# Patient Record
Sex: Male | Born: 1950 | Race: Black or African American | Hispanic: No | Marital: Married | Smoking: Former smoker
Health system: Southern US, Community
[De-identification: ages and names within clinical notes are randomized; demographics above are authoritative.]

## PROBLEM LIST (undated history)

## (undated) DIAGNOSIS — G4730 Sleep apnea, unspecified: Secondary | ICD-10-CM

## (undated) DIAGNOSIS — I10 Essential (primary) hypertension: Secondary | ICD-10-CM

## (undated) DIAGNOSIS — M1990 Unspecified osteoarthritis, unspecified site: Secondary | ICD-10-CM

## (undated) DIAGNOSIS — E7800 Pure hypercholesterolemia, unspecified: Secondary | ICD-10-CM

## (undated) HISTORY — PX: OTHER SURGICAL HISTORY: SHX169

## (undated) HISTORY — PX: BACK SURGERY: SHX140

---

## 2012-11-26 ENCOUNTER — Other Ambulatory Visit: Payer: Self-pay | Admitting: Orthopedic Surgery

## 2012-11-26 MED ORDER — DEXAMETHASONE SODIUM PHOSPHATE 10 MG/ML IJ SOLN: 10.0000 mg | Freq: Once | INTRAMUSCULAR | Status: DC

## 2012-11-26 MED ORDER — BUPIVACAINE LIPOSOME 1.3 % IJ SUSP: 20.0000 mL | Freq: Once | INTRAMUSCULAR | Status: DC

## 2012-11-26 NOTE — Progress Notes (Signed)
Preoperative surgical orders have been place into the Epic hospital system for Gearldine Bienenstock on 11/26/2012, 5:21 PM  by Patrica Duel for surgery on 12/13/2012.  Preop Bilateral Total Knee orders including Epidural per Anesthesia, IV Tylenol, and IV Decadron as long as there are no contraindications to the above medications. Avel Peace, PA-C

## 2012-11-26 NOTE — Progress Notes (Signed)
Need orders when possible please - Pt coming for PST on Tues 12/07/12 -  Thank you

## 2012-11-29 NOTE — Progress Notes (Signed)
Preop 12/07/12 at 1100am. Surgery scheduled for 12/13/12.  Need orders in EPIC.  Thank You.

## 2012-11-30 ENCOUNTER — Other Ambulatory Visit: Payer: Self-pay | Admitting: Orthopedic Surgery

## 2012-11-30 ENCOUNTER — Encounter (HOSPITAL_COMMUNITY): Payer: Self-pay | Admitting: Pharmacy Technician

## 2012-12-06 NOTE — Patient Instructions (Addendum)
Everhett Bozard  12/06/2012   Your procedure is scheduled on:  12/13/12   Report to [facility] at     1045  AM.  Call this number if you have problems the morning of surgery: [phone]   Remember:   Do not eat food   After midnite. May have clear liquids until 0700am then npo     Take these medicines the morning of surgery with A SIP OF WATER:    Do not wear jewelry,   Do not wear lotions, powders, or perfumes.    Men may shave face and neck.  Do not bring valuables to the hospital.  Contacts, dentures or bridgework may not be worn into surgery.  Leave suitcase in the car. After surgery it may be brought to your room.  For patients admitted to the hospital, checkout time is 11:00 AM the day of  discharge.    SEE CHG INSTRUCTION SHEET    Please read over the following fact sheets that you were given: MRSA Information, coughing and deep breathing exercises, leg exercises, Blood Transfusion Fact sheet, Incentive Spirometry fact sheet                Failure to comply with these instructions may result in cancellation of your surgery.                Patient Signature ____________________________              Nurse Signature _____________________________

## 2012-12-07 ENCOUNTER — Ambulatory Visit (HOSPITAL_COMMUNITY)
Admission: RE | Admit: 2012-12-07 | Discharge: 2012-12-07 | Disposition: A | Discharge status: Home / Self Care | Payer: BC Managed Care – PPO | Source: Ambulatory Visit | Attending: Orthopedic Surgery | Admitting: Orthopedic Surgery

## 2012-12-07 ENCOUNTER — Other Ambulatory Visit: Payer: Self-pay | Admitting: Orthopedic Surgery

## 2012-12-07 ENCOUNTER — Encounter (HOSPITAL_COMMUNITY)
Admission: RE | Admit: 2012-12-07 | Discharge: 2012-12-07 | Disposition: A | Discharge status: Home / Self Care | Payer: BC Managed Care – PPO | Source: Ambulatory Visit | Attending: Orthopedic Surgery | Admitting: Orthopedic Surgery

## 2012-12-07 ENCOUNTER — Encounter (HOSPITAL_COMMUNITY): Payer: Self-pay

## 2012-12-07 DIAGNOSIS — R918 Other nonspecific abnormal finding of lung field: Secondary | ICD-10-CM | POA: Insufficient documentation

## 2012-12-07 DIAGNOSIS — Z01812 Encounter for preprocedural laboratory examination: Secondary | ICD-10-CM | POA: Insufficient documentation

## 2012-12-07 DIAGNOSIS — I10 Essential (primary) hypertension: Secondary | ICD-10-CM | POA: Insufficient documentation

## 2012-12-07 DIAGNOSIS — Z87891 Personal history of nicotine dependence: Secondary | ICD-10-CM | POA: Insufficient documentation

## 2012-12-07 DIAGNOSIS — Z01818 Encounter for other preprocedural examination: Secondary | ICD-10-CM | POA: Insufficient documentation

## 2012-12-07 DIAGNOSIS — M5380 Other specified dorsopathies, site unspecified: Secondary | ICD-10-CM | POA: Insufficient documentation

## 2012-12-07 DIAGNOSIS — Z0183 Encounter for blood typing: Secondary | ICD-10-CM | POA: Insufficient documentation

## 2012-12-07 HISTORY — DX: Unspecified osteoarthritis, unspecified site: M19.90

## 2012-12-07 HISTORY — DX: Sleep apnea, unspecified: G47.30

## 2012-12-07 HISTORY — DX: Essential (primary) hypertension: I10

## 2012-12-07 LAB — URINE MICROSCOPIC-ADD ON

## 2012-12-07 LAB — URINALYSIS, ROUTINE W REFLEX MICROSCOPIC
Glucose, UA: NEGATIVE mg/dL
Hgb urine dipstick: NEGATIVE
Specific Gravity, Urine: 1.021 (ref 1.005–1.030)

## 2012-12-07 LAB — SURGICAL PCR SCREEN
MRSA, PCR: NEGATIVE
Staphylococcus aureus: NEGATIVE

## 2012-12-07 LAB — ABO/RH: ABO/RH(D): A POS

## 2012-12-07 LAB — APTT: aPTT: 32 seconds (ref 24–37)

## 2012-12-07 NOTE — Progress Notes (Signed)
CBC and CMP done 11/25/12 along with Office Visit note from [facility].

## 2012-12-07 NOTE — Progress Notes (Signed)
Urinalysis with micro results faxed via EPIC fax to Dr Lequita Halt.

## 2012-12-07 NOTE — Progress Notes (Signed)
CXR results faxed via EPIC to Dr Aluisio.   

## 2012-12-09 LAB — URINE CULTURE

## 2012-12-12 ENCOUNTER — Other Ambulatory Visit: Payer: Self-pay | Admitting: Orthopedic Surgery

## 2012-12-12 MED ORDER — DEXTROSE 5 % IV SOLN
3.0000 g | INTRAVENOUS | Status: DC
  Filled 2012-12-12: qty 3000

## 2012-12-12 NOTE — H&P (Signed)
Kent Ryan  DOB: 1950-12-20 Married / Language: [place] / Race: Black or African American Male  Date of Admission:  12/13/2012  Chief Complaint:  Bilateral Knee Pain  History of Present Illness The patient is a 62 year old male who comes in for a preoperative History and Physical. The patient is scheduled for a bilateral total knee arthroplasty to be performed by Dr. Gus Rankin. Aluisio, MD at [facility] on 12/13/2012. The patient is a 62 year old male who presents with knee complaints. The patient is seen for both knees. The patient reports left knee and right knee symptoms including: pain, swelling, instability, giving way and stiffness which began year(s) ago without any known injury. Note for "Knee pain": He has had cortisone injections by his PCP and an orthopaedic in [place] in the past. He said they used to help, when he first started getting them. They no longer help. He said it may have been 2 years or more since his last cortisone injection. He had gout about a month ago and was treated by his PCP. [name] comes in today with a chief complaint of bilateral knee pain. He reports that he has had issues with this for several years. He has had no specific injuries to the knees. He has just had progressive worsening of arthritis. He has been treated for this previously both by his primary care physician and another orthopaedic physician in [place]. He says that he has had Cortisone injections over the years. He has not had Visco supplementation. He denies any history of surgery on the knees including arthroscopic procedures. He has had trouble with the right knee longer than he has with the left knee, but says currently they bother him about the same amount. Some days the left knee bothers him more. Some days the right knee bothers him more. He has pain at all times even at rest, even at night when he is laying in bed. He has significant issues with weightbearing almost  finding it difficult to work because of the inability to stand for long periods of time. He does have some instability in the knees, but says he has not fallen. In regards to the Cortisone injections he said when he first started those they were beneficial, but now they do not work for more than a few weeks at a time. He is aware that he needs to have surgery, but has not had the discussion about surgery with anyone yet. He denies groin pain. No numbness or tingling in the legs. No back issues.  He is ready to proceed with surgery at this time. They have been treated conservatively in the past for the above stated problem and despite conservative measures, they continue to have progressive pain and severe functional limitations and dysfunction. They have failed non-operative management including home exercise, medications, and injections. It is felt that they would benefit from undergoing total joint replacement. Risks and benefits of the procedure have been discussed with the patient and they elect to proceed with surgery. There are no active contraindications to surgery such as ongoing infection or rapidly progressive neurological disease.   Problem List Primary osteoarthritis of both knees (715.16)   Allergies No Known Drug Allergies. 09/02/2012   Family History Heart disease in male family member before age 26 First Degree Relatives. reported   Social History Illicit drug use. no Living situation. live with spouse Marital status. married Drug/Alcohol Rehab (Currently). no Drug/Alcohol Rehab (Previously). no Exercise. Exercises never Tobacco /  smoke exposure. no Tobacco use. never smoker Never smoker Number of flights of stairs before winded. less than 1 Pain Contract. yes Current work status. working part time Alcohol use. current drinker; drinks wine; only occasionally per week Children. 3 Post-Surgical Plans. Plan is to go to [facility].   Medication  History Lisinopril-Hydrochlorothiazide (20-12.5MG  Tablet, Oral) Active. Hydrocodone-Acetaminophen (5-300MG  Tablet, Oral) Active. Diazepam (5MG  Tablet, Oral) Active. Atorvastatin Calcium (40MG  Tablet, Oral) Active. Ibuprofen (800MG  Tablet, Oral) Active. Vitamin B Complex ( Oral) Active. Flonase (50MCG/ACT Suspension, Nasal) Active.   Past Surgical History Arthroscopy of Knee. bilateral Spinal Surgery   Medical History Gout. 2 episodes in the past Sleep Apnea. uses CPAP High blood pressure Hypercholesterolemia   Review of Systems General:Not Present- Chills, Fever, Night Sweats, Appetite Loss, Fatigue, Feeling sick, Weight Gain and Weight Loss. Skin:Not Present- Itching, Rash, Skin Color Changes, Ulcer, Psoriasis and Change in Hair or Nails. HEENT:Present- Hearing problems and Ringing in the Ears. Not Present- Sensitivity to light and Nose Bleed. Neck:Not Present- Swollen Glands and Neck Mass. Respiratory:Not Present- Snoring, Chronic Cough, Bloody sputum and Dyspnea. Cardiovascular:Present- Swelling of Extremities and Leg Cramps. Not Present- Shortness of Breath, Chest Pain and Palpitations. Gastrointestinal:Not Present- Bloody Stool, Heartburn, Abdominal Pain, Vomiting, Nausea and Incontinence of Stool. Male Genitourinary:Not Present- Blood in Urine, Frequency, Incontinence and Nocturia. Musculoskeletal:Present- Muscle Weakness, Muscle Pain, Joint Stiffness, Joint Swelling, Joint Pain and Back Pain. Neurological:Present- Tingling and Numbness. Not Present- Burning, Tremor, Headaches and Dizziness. Psychiatric:Not Present- Anxiety, Depression and Memory Loss. Endocrine:Not Present- Cold Intolerance, Heat Intolerance, Excessive hunger and Excessive Thirst. Hematology:Not Present- Abnormal Bleeding, Anemia, Blood Clots and Easy Bruising.   Vitals Pulse: 72 (Regular) Resp.: 16 (Unlabored) BP: 132/92 (Sitting, Right Arm, Standard)    Physical  Exam The physical exam findings are as follows:  Note: Patient is a 62 year old male with bilateral knee pain. Patient is accompanied today by his sister.   General Mental Status - Alert, cooperative and good historian. General Appearance- pleasant. Not in acute distress. Orientation- Oriented X3. Build & Nutrition- Well nourished and Well developed.   Head and Neck Head- normocephalic, atraumatic . Neck Global Assessment- supple. no bruit auscultated on the right and no bruit auscultated on the left.   Eye Pupil- Bilateral- Regular and Round. Motion- Bilateral- EOMI.   ENMT upper partial dentures  Chest and Lung Exam Auscultation: Breath sounds:- clear at anterior chest wall and - clear at posterior chest wall. Adventitious sounds:- No Adventitious sounds.   Cardiovascular Auscultation:Rhythm- Regular rate and rhythm. Heart Sounds- S1 WNL and S2 WNL. Murmurs & Other Heart Sounds:Auscultation of the heart reveals - No Murmurs.   Abdomen Inspection:Contour- Generalized mild distention. Palpation/Percussion:Tenderness- Abdomen is non-tender to palpation. Rigidity (guarding)- Abdomen is soft. Auscultation:Auscultation of the abdomen reveals - Bowel sounds normal.   Male Genitourinary Not done, not pertinent to present illness  Musculoskeletal He is alert and oriented in no acute distress. He has no effusion or instability about the knees. He has a significant genu varus deformity bilaterally. Normal painless range of motion in the hips. In regards to the right knee, range of motion is 5 to 110 degrees. Left knee is 5 to 105. He has significant patellofemoral crepitus in both knees. He is tender medially greater than laterally in both knees. The calves are soft, nontender. Sensation and circulation are intact in the lower extremities and the motor function is intact in the lower extremities.  RADIOGRAPHS: X-rays of the knees, AP view  shows significant genu varus  deformity in both knees as well as complete collapse of the medial compartment of both knees. He has significant periarticular osteophyte formation on the medial portion of the tibial plateau as well as the medial femoral condyle. On the lateral view of the right knee, he has significant osteophyte formation about the patella and he is bone on bone in that medial compartment. On the left knee, he is also bone on bone in the medial compartment as well as patellofemoral compartment.  Assessment & Plan Primary osteoarthritis of both knees (715.16)  Note: Plan is for a Bilateral Total Knee Replacements by Dr. Lequita Halt.  The patient does not have any contraindications and will recieve TXA (tranexamic acid) prior to surgery.  Plan is to go to Rehab following the hospital stay.  Signed electronically by Lauraine Rinne, III PA-C

## 2012-12-13 ENCOUNTER — Encounter (HOSPITAL_COMMUNITY): Payer: Self-pay | Admitting: *Deleted

## 2012-12-13 ENCOUNTER — Inpatient Hospital Stay (HOSPITAL_COMMUNITY)
Admission: RE | Admit: 2012-12-13 | Discharge: 2012-12-22 | DRG: 471 | Disposition: A | Discharge status: Rehabilitation Facility | Payer: BC Managed Care – PPO | Source: Ambulatory Visit | Attending: Orthopedic Surgery | Admitting: Orthopedic Surgery

## 2012-12-13 ENCOUNTER — Encounter (HOSPITAL_COMMUNITY)
Admission: RE | Disposition: A | Discharge status: Rehabilitation Facility | Payer: Self-pay | Source: Ambulatory Visit | Attending: Orthopedic Surgery

## 2012-12-13 ENCOUNTER — Inpatient Hospital Stay (HOSPITAL_COMMUNITY): Payer: BC Managed Care – PPO | Admitting: Anesthesiology

## 2012-12-13 ENCOUNTER — Inpatient Hospital Stay (HOSPITAL_COMMUNITY): Payer: BC Managed Care – PPO

## 2012-12-13 ENCOUNTER — Encounter (HOSPITAL_COMMUNITY): Payer: Self-pay | Admitting: Anesthesiology

## 2012-12-13 DIAGNOSIS — K567 Ileus, unspecified: Secondary | ICD-10-CM

## 2012-12-13 DIAGNOSIS — M109 Gout, unspecified: Secondary | ICD-10-CM | POA: Diagnosis present

## 2012-12-13 DIAGNOSIS — D62 Acute posthemorrhagic anemia: Secondary | ICD-10-CM

## 2012-12-13 DIAGNOSIS — Y831 Surgical operation with implant of artificial internal device as the cause of abnormal reaction of the patient, or of later complication, without mention of misadventure at the time of the procedure: Secondary | ICD-10-CM | POA: Diagnosis not present

## 2012-12-13 DIAGNOSIS — E7800 Pure hypercholesterolemia, unspecified: Secondary | ICD-10-CM | POA: Diagnosis present

## 2012-12-13 DIAGNOSIS — E871 Hypo-osmolality and hyponatremia: Secondary | ICD-10-CM

## 2012-12-13 DIAGNOSIS — M1710 Unilateral primary osteoarthritis, unspecified knee: Secondary | ICD-10-CM | POA: Diagnosis present

## 2012-12-13 DIAGNOSIS — M179 Osteoarthritis of knee, unspecified: Secondary | ICD-10-CM | POA: Diagnosis present

## 2012-12-13 DIAGNOSIS — I10 Essential (primary) hypertension: Secondary | ICD-10-CM | POA: Diagnosis present

## 2012-12-13 DIAGNOSIS — Z87891 Personal history of nicotine dependence: Secondary | ICD-10-CM

## 2012-12-13 DIAGNOSIS — K560 Paralytic ileus: Secondary | ICD-10-CM | POA: Diagnosis not present

## 2012-12-13 DIAGNOSIS — G4730 Sleep apnea, unspecified: Secondary | ICD-10-CM | POA: Diagnosis present

## 2012-12-13 DIAGNOSIS — K929 Disease of digestive system, unspecified: Secondary | ICD-10-CM | POA: Diagnosis not present

## 2012-12-13 DIAGNOSIS — Z79899 Other long term (current) drug therapy: Secondary | ICD-10-CM

## 2012-12-13 DIAGNOSIS — Z96653 Presence of artificial knee joint, bilateral: Secondary | ICD-10-CM

## 2012-12-13 HISTORY — PX: TOTAL KNEE ARTHROPLASTY: SHX125

## 2012-12-13 LAB — TYPE AND SCREEN
ABO/RH(D): A POS
Antibody Screen: NEGATIVE

## 2012-12-13 SURGERY — ARTHROPLASTY, KNEE, BILATERAL, TOTAL
Anesthesia: Epidural | Site: Knee | Laterality: Bilateral | Wound class: Clean

## 2012-12-13 MED ORDER — NALBUPHINE HCL 10 MG/ML IJ SOLN
5.0000 mg | INTRAMUSCULAR | Status: DC | PRN
  Filled 2012-12-13: qty 1

## 2012-12-13 MED ORDER — CEFAZOLIN SODIUM-DEXTROSE 2-3 GM-% IV SOLR
2.0000 g | INTRAVENOUS | Status: AC
  Administered 2012-12-13: 2 g via INTRAVENOUS

## 2012-12-13 MED ORDER — CHLORHEXIDINE GLUCONATE 4 % EX LIQD: 60.0000 mL | Freq: Once | CUTANEOUS | Status: DC

## 2012-12-13 MED ORDER — FENTANYL CITRATE 0.05 MG/ML IJ SOLN: 25.0000 ug | INTRAMUSCULAR | Status: DC | PRN

## 2012-12-13 MED ORDER — ACETAMINOPHEN 650 MG RE SUPP
650.0000 mg | Freq: Four times a day (QID) | RECTAL | Status: AC
  Filled 2012-12-13: qty 1

## 2012-12-13 MED ORDER — FENTANYL CITRATE 0.05 MG/ML IJ SOLN
INTRAMUSCULAR | Status: DC | PRN
  Administered 2012-12-13: 100 ug via INTRAVENOUS

## 2012-12-13 MED ORDER — DEXAMETHASONE SODIUM PHOSPHATE 10 MG/ML IJ SOLN
10.0000 mg | Freq: Every day | INTRAMUSCULAR | Status: AC
  Filled 2012-12-13: qty 1

## 2012-12-13 MED ORDER — MEPERIDINE HCL 50 MG/ML IJ SOLN: 6.2500 mg | INTRAMUSCULAR | Status: DC | PRN

## 2012-12-13 MED ORDER — STERILE WATER FOR IRRIGATION IR SOLN
Status: DC | PRN
  Administered 2012-12-13: 3000 mL

## 2012-12-13 MED ORDER — ONDANSETRON HCL 4 MG PO TABS: 4.0000 mg | ORAL_TABLET | Freq: Four times a day (QID) | ORAL | Status: DC | PRN

## 2012-12-13 MED ORDER — PHENOL 1.4 % MT LIQD
1.0000 | OROMUCOSAL | Status: DC | PRN
  Filled 2012-12-13: qty 177

## 2012-12-13 MED ORDER — SODIUM CHLORIDE 0.9 % IV SOLN
INTRAVENOUS | Status: DC
  Administered 2012-12-13 – 2012-12-15 (×4): via EPIDURAL
  Filled 2012-12-13 (×14): qty 20

## 2012-12-13 MED ORDER — KETOROLAC TROMETHAMINE 60 MG/2ML IM SOLN
60.0000 mg | Freq: Once | INTRAMUSCULAR | Status: DC | PRN
  Filled 2012-12-13: qty 2

## 2012-12-13 MED ORDER — ACETAMINOPHEN 500 MG PO TABS
1000.0000 mg | ORAL_TABLET | Freq: Four times a day (QID) | ORAL | Status: AC
  Administered 2012-12-13 – 2012-12-14 (×4): 1000 mg via ORAL
  Filled 2012-12-13 (×4): qty 2

## 2012-12-13 MED ORDER — LACTATED RINGERS IV SOLN
INTRAVENOUS | Status: DC | PRN
  Administered 2012-12-13 (×3): via INTRAVENOUS

## 2012-12-13 MED ORDER — DIPHENHYDRAMINE HCL 50 MG/ML IJ SOLN: 25.0000 mg | INTRAMUSCULAR | Status: DC | PRN

## 2012-12-13 MED ORDER — SODIUM CHLORIDE 0.9 % IV SOLN: INTRAVENOUS | Status: DC

## 2012-12-13 MED ORDER — ONDANSETRON HCL 4 MG/2ML IJ SOLN
INTRAMUSCULAR | Status: DC | PRN
  Administered 2012-12-13: 4 mg via INTRAVENOUS

## 2012-12-13 MED ORDER — TRAMADOL HCL 50 MG PO TABS
50.0000 mg | ORAL_TABLET | Freq: Four times a day (QID) | ORAL | Status: DC | PRN
  Administered 2012-12-17 – 2012-12-20 (×3): 100 mg via ORAL
  Filled 2012-12-13 (×3): qty 2

## 2012-12-13 MED ORDER — TRANEXAMIC ACID 100 MG/ML IV SOLN
1000.0000 mg | INTRAVENOUS | Status: AC
  Administered 2012-12-13: 1000 mg via INTRAVENOUS
  Filled 2012-12-13: qty 10

## 2012-12-13 MED ORDER — PHENYLEPHRINE HCL 10 MG/ML IJ SOLN
10.0000 mg | INTRAVENOUS | Status: DC | PRN
  Administered 2012-12-13: 50 ug/min via INTRAVENOUS

## 2012-12-13 MED ORDER — DIPHENHYDRAMINE HCL 25 MG PO CAPS
25.0000 mg | ORAL_CAPSULE | ORAL | Status: DC | PRN
  Filled 2012-12-13: qty 1

## 2012-12-13 MED ORDER — ONDANSETRON HCL 4 MG/2ML IJ SOLN
4.0000 mg | Freq: Four times a day (QID) | INTRAMUSCULAR | Status: DC | PRN
  Administered 2012-12-14 – 2012-12-20 (×4): 4 mg via INTRAVENOUS
  Filled 2012-12-13 (×2): qty 2

## 2012-12-13 MED ORDER — ACETAMINOPHEN 10 MG/ML IV SOLN: 1000.0000 mg | Freq: Once | INTRAVENOUS | Status: DC

## 2012-12-13 MED ORDER — KETOROLAC TROMETHAMINE 30 MG/ML IJ SOLN: 30.0000 mg | Freq: Four times a day (QID) | INTRAMUSCULAR | Status: DC | PRN

## 2012-12-13 MED ORDER — PROMETHAZINE HCL 25 MG/ML IJ SOLN: 6.2500 mg | INTRAMUSCULAR | Status: DC | PRN

## 2012-12-13 MED ORDER — PROPOFOL 10 MG/ML IV BOLUS
INTRAVENOUS | Status: DC | PRN
  Administered 2012-12-13: 250 mg via INTRAVENOUS

## 2012-12-13 MED ORDER — CEFAZOLIN SODIUM-DEXTROSE 2-3 GM-% IV SOLR
2.0000 g | Freq: Four times a day (QID) | INTRAVENOUS | Status: AC
  Administered 2012-12-13 – 2012-12-14 (×2): 2 g via INTRAVENOUS
  Filled 2012-12-13 (×2): qty 50

## 2012-12-13 MED ORDER — 0.9 % SODIUM CHLORIDE (POUR BTL) OPTIME
TOPICAL | Status: DC | PRN
  Administered 2012-12-13: 1000 mL

## 2012-12-13 MED ORDER — DIPHENHYDRAMINE HCL 12.5 MG/5ML PO ELIX: 12.5000 mg | ORAL_SOLUTION | ORAL | Status: DC | PRN

## 2012-12-13 MED ORDER — FLEET ENEMA 7-19 GM/118ML RE ENEM: 1.0000 | ENEMA | Freq: Once | RECTAL | Status: AC | PRN

## 2012-12-13 MED ORDER — DEXAMETHASONE 6 MG PO TABS
10.0000 mg | ORAL_TABLET | Freq: Every day | ORAL | Status: AC
  Administered 2012-12-14: 10 mg via ORAL
  Filled 2012-12-13: qty 1

## 2012-12-13 MED ORDER — NALOXONE HCL 0.4 MG/ML IJ SOLN: 0.4000 mg | INTRAMUSCULAR | Status: DC | PRN

## 2012-12-13 MED ORDER — MENTHOL 3 MG MT LOZG
1.0000 | LOZENGE | OROMUCOSAL | Status: DC | PRN
  Filled 2012-12-13: qty 9

## 2012-12-13 MED ORDER — NALOXONE HCL 1 MG/ML IJ SOLN
1.0000 ug/kg/h | INTRAVENOUS | Status: DC | PRN
  Filled 2012-12-13: qty 2

## 2012-12-13 MED ORDER — METHOCARBAMOL 500 MG PO TABS
500.0000 mg | ORAL_TABLET | Freq: Four times a day (QID) | ORAL | Status: DC | PRN
  Administered 2012-12-13 – 2012-12-20 (×17): 500 mg via ORAL
  Filled 2012-12-13 (×17): qty 1

## 2012-12-13 MED ORDER — METHOCARBAMOL 100 MG/ML IJ SOLN
500.0000 mg | Freq: Four times a day (QID) | INTRAVENOUS | Status: DC | PRN
  Administered 2012-12-14: 500 mg via INTRAVENOUS
  Filled 2012-12-13 (×2): qty 5

## 2012-12-13 MED ORDER — FLUTICASONE PROPIONATE 50 MCG/ACT NA SUSP: 2.0000 | Freq: Every day | NASAL | Status: DC | PRN

## 2012-12-13 MED ORDER — MIDAZOLAM HCL 5 MG/5ML IJ SOLN
INTRAMUSCULAR | Status: DC | PRN
  Administered 2012-12-13: 2 mg via INTRAVENOUS

## 2012-12-13 MED ORDER — BISACODYL 10 MG RE SUPP
10.0000 mg | Freq: Every day | RECTAL | Status: DC | PRN
  Administered 2012-12-18 – 2012-12-20 (×2): 10 mg via RECTAL
  Filled 2012-12-13 (×2): qty 1

## 2012-12-13 MED ORDER — DIAZEPAM 5 MG PO TABS
5.0000 mg | ORAL_TABLET | Freq: Four times a day (QID) | ORAL | Status: DC | PRN
  Administered 2012-12-13 – 2012-12-14 (×2): 5 mg via ORAL
  Filled 2012-12-13 (×2): qty 1

## 2012-12-13 MED ORDER — BUPIVACAINE HCL (PF) 0.5 % IJ SOLN
INTRAMUSCULAR | Status: DC | PRN
  Administered 2012-12-13 (×2): 5 mL
  Administered 2012-12-13: 5 mL via EPIDURAL
  Administered 2012-12-13 (×2): 5 mL

## 2012-12-13 MED ORDER — MORPHINE SULFATE 2 MG/ML IJ SOLN
1.0000 mg | INTRAMUSCULAR | Status: DC | PRN
  Administered 2012-12-14 – 2012-12-15 (×10): 2 mg via INTRAVENOUS
  Filled 2012-12-13 (×10): qty 1

## 2012-12-13 MED ORDER — LACTATED RINGERS IV SOLN: INTRAVENOUS | Status: DC

## 2012-12-13 MED ORDER — PHENYLEPHRINE HCL 10 MG/ML IJ SOLN
INTRAMUSCULAR | Status: DC | PRN
  Administered 2012-12-13 (×2): 40 ug via INTRAVENOUS

## 2012-12-13 MED ORDER — SCOPOLAMINE 1 MG/3DAYS TD PT72
1.0000 | MEDICATED_PATCH | Freq: Once | TRANSDERMAL | Status: AC
  Administered 2012-12-13: 1.5 mg via TRANSDERMAL
  Filled 2012-12-13: qty 1

## 2012-12-13 MED ORDER — SODIUM CHLORIDE 0.9 % IJ SOLN: 3.0000 mL | INTRAMUSCULAR | Status: DC | PRN

## 2012-12-13 MED ORDER — SODIUM CHLORIDE 0.9 % IV SOLN
INTRAVENOUS | Status: DC
  Administered 2012-12-13 – 2012-12-17 (×8): via INTRAVENOUS
  Administered 2012-12-17: 100 mL/h via INTRAVENOUS
  Administered 2012-12-18 – 2012-12-19 (×3): via INTRAVENOUS

## 2012-12-13 MED ORDER — LATANOPROST 0.005 % OP SOLN
1.0000 [drp] | Freq: Every day | OPHTHALMIC | Status: DC
  Administered 2012-12-13 – 2012-12-21 (×9): 1 [drp] via OPHTHALMIC
  Filled 2012-12-13: qty 2.5

## 2012-12-13 MED ORDER — ONDANSETRON HCL 4 MG/2ML IJ SOLN
4.0000 mg | Freq: Three times a day (TID) | INTRAMUSCULAR | Status: DC | PRN
  Filled 2012-12-13 (×2): qty 2

## 2012-12-13 MED ORDER — LIDOCAINE HCL (PF) 2 % IJ SOLN
INTRAMUSCULAR | Status: DC | PRN
  Administered 2012-12-13: 5 mL
  Administered 2012-12-13: 10 mL

## 2012-12-13 MED ORDER — DOCUSATE SODIUM 100 MG PO CAPS
100.0000 mg | ORAL_CAPSULE | Freq: Two times a day (BID) | ORAL | Status: DC
  Administered 2012-12-14 – 2012-12-21 (×14): 100 mg via ORAL
  Filled 2012-12-13 (×9): qty 1

## 2012-12-13 MED ORDER — OXYCODONE HCL 5 MG PO TABS
5.0000 mg | ORAL_TABLET | ORAL | Status: DC | PRN
  Administered 2012-12-13: 5 mg via ORAL
  Administered 2012-12-14 – 2012-12-15 (×4): 10 mg via ORAL
  Filled 2012-12-13 (×3): qty 2
  Filled 2012-12-13: qty 1
  Filled 2012-12-13: qty 2

## 2012-12-13 MED ORDER — METOCLOPRAMIDE HCL 10 MG PO TABS
5.0000 mg | ORAL_TABLET | Freq: Three times a day (TID) | ORAL | Status: DC | PRN
  Administered 2012-12-17 – 2012-12-19 (×3): 10 mg via ORAL
  Filled 2012-12-13 (×3): qty 1

## 2012-12-13 MED ORDER — DIPHENHYDRAMINE HCL 50 MG/ML IJ SOLN: 12.5000 mg | INTRAMUSCULAR | Status: DC | PRN

## 2012-12-13 MED ORDER — SODIUM CHLORIDE 0.9 % IR SOLN
Status: DC | PRN
  Administered 2012-12-13: 3000 mL

## 2012-12-13 MED ORDER — METOCLOPRAMIDE HCL 5 MG/ML IJ SOLN
5.0000 mg | Freq: Three times a day (TID) | INTRAMUSCULAR | Status: DC | PRN
  Filled 2012-12-13: qty 2

## 2012-12-13 MED ORDER — ATORVASTATIN CALCIUM 40 MG PO TABS
40.0000 mg | ORAL_TABLET | Freq: Every day | ORAL | Status: DC
  Administered 2012-12-14 – 2012-12-22 (×9): 40 mg via ORAL
  Filled 2012-12-13 (×10): qty 1

## 2012-12-13 MED ORDER — METOCLOPRAMIDE HCL 5 MG/ML IJ SOLN
10.0000 mg | Freq: Three times a day (TID) | INTRAMUSCULAR | Status: DC | PRN
  Administered 2012-12-17 – 2012-12-18 (×2): 10 mg via INTRAVENOUS
  Filled 2012-12-13 (×2): qty 2

## 2012-12-13 MED ORDER — MEPERIDINE HCL 50 MG/ML IJ SOLN
6.2500 mg | INTRAMUSCULAR | Status: DC | PRN
  Administered 2012-12-13: 12.5 mg via INTRAVENOUS

## 2012-12-13 MED ORDER — POLYETHYLENE GLYCOL 3350 17 G PO PACK: 17.0000 g | PACK | Freq: Every day | ORAL | Status: DC | PRN

## 2012-12-13 SURGICAL SUPPLY — 58 items
AUTOTRANSFUSION W/QD PVC DRAIN (AUTOTRANSFUSION) ×4 IMPLANT
BAG ZIPLOCK 12X15 (MISCELLANEOUS) ×4 IMPLANT
BANDAGE ELASTIC 6 VELCRO ST LF (GAUZE/BANDAGES/DRESSINGS) ×2 IMPLANT
BANDAGE ESMARK 6X9 LF (GAUZE/BANDAGES/DRESSINGS) ×2 IMPLANT
BLADE SAG 18X100X1.27 (BLADE) ×2 IMPLANT
BLADE SAW SGTL 11.0X1.19X90.0M (BLADE) ×4 IMPLANT
BLADE SURG SZ10 CARB STEEL (BLADE) ×4 IMPLANT
BNDG COHESIVE 6X5 TAN STRL LF (GAUZE/BANDAGES/DRESSINGS) ×2 IMPLANT
BNDG ELASTIC 2 VLCR STRL LF (GAUZE/BANDAGES/DRESSINGS) ×2 IMPLANT
BNDG ESMARK 6X9 LF (GAUZE/BANDAGES/DRESSINGS) ×4
BOWL SMART MIX CTS (DISPOSABLE) ×4 IMPLANT
CAPT RP KNEE ×4 IMPLANT
CEMENT HV SMART SET (Cement) ×8 IMPLANT
CLOTH BEACON ORANGE TIMEOUT ST (SAFETY) ×2 IMPLANT
CUFF TOURN SGL QUICK 34 (TOURNIQUET CUFF) ×2
CUFF TRNQT CYL 34X4X40X1 (TOURNIQUET CUFF) ×2 IMPLANT
DRAPE EXTREMITY BILATERAL (DRAPE) ×2 IMPLANT
DRAPE INCISE IOBAN 66X45 STRL (DRAPES) ×2 IMPLANT
DRAPE POUCH INSTRU U-SHP 10X18 (DRAPES) ×2 IMPLANT
DRAPE U-SHAPE 47X51 STRL (DRAPES) ×6 IMPLANT
DRSG ADAPTIC 3X8 NADH LF (GAUZE/BANDAGES/DRESSINGS) ×2 IMPLANT
DRSG PAD ABDOMINAL 8X10 ST (GAUZE/BANDAGES/DRESSINGS) ×2 IMPLANT
DURAPREP 26ML APPLICATOR (WOUND CARE) ×4 IMPLANT
ELECT REM PT RETURN 9FT ADLT (ELECTROSURGICAL) ×2
ELECTRODE REM PT RTRN 9FT ADLT (ELECTROSURGICAL) ×1 IMPLANT
FACESHIELD LNG OPTICON STERILE (SAFETY) ×16 IMPLANT
GLOVE BIO SURGEON STRL SZ7.5 (GLOVE) ×8 IMPLANT
GLOVE BIO SURGEON STRL SZ8 (GLOVE) ×4 IMPLANT
GLOVE BIOGEL PI IND STRL 8 (GLOVE) ×2 IMPLANT
GLOVE BIOGEL PI INDICATOR 8 (GLOVE) ×2
GOWN STRL NON-REIN LRG LVL3 (GOWN DISPOSABLE) ×2 IMPLANT
GOWN STRL REIN XL XLG (GOWN DISPOSABLE) ×2 IMPLANT
HANDPIECE INTERPULSE COAX TIP (DISPOSABLE) ×1
IMMOBILIZER KNEE 20 (SOFTGOODS) ×2
IMMOBILIZER KNEE 20 THIGH 36 (SOFTGOODS) ×1 IMPLANT
IMMOBILIZER KNEE 22 UNIV (SOFTGOODS) ×4 IMPLANT
KIT BASIN OR (CUSTOM PROCEDURE TRAY) ×2 IMPLANT
MANIFOLD NEPTUNE II (INSTRUMENTS) ×2 IMPLANT
NDL SAFETY ECLIPSE 18X1.5 (NEEDLE) ×1 IMPLANT
NEEDLE HYPO 18GX1.5 SHARP (NEEDLE) ×1
NS IRRIG 1000ML POUR BTL (IV SOLUTION) ×2 IMPLANT
PACK TOTAL JOINT (CUSTOM PROCEDURE TRAY) ×2 IMPLANT
PADDING CAST COTTON 6X4 STRL (CAST SUPPLIES) ×4 IMPLANT
SET HNDPC FAN SPRY TIP SCT (DISPOSABLE) ×1 IMPLANT
SPONGE GAUZE 4X4 12PLY (GAUZE/BANDAGES/DRESSINGS) ×4 IMPLANT
SPONGE LAP 18X18 X RAY DECT (DISPOSABLE) ×2 IMPLANT
STOCKINETTE 8 INCH (MISCELLANEOUS) ×2 IMPLANT
STRIP CLOSURE SKIN 1/2X4 (GAUZE/BANDAGES/DRESSINGS) ×4 IMPLANT
SUCTION FRAZIER 12FR DISP (SUCTIONS) ×2 IMPLANT
SUT MNCRL AB 4-0 PS2 18 (SUTURE) ×4 IMPLANT
SUT VIC AB 2-0 CT1 27 (SUTURE) ×6
SUT VIC AB 2-0 CT1 TAPERPNT 27 (SUTURE) ×6 IMPLANT
SUT VLOC 180 0 24IN GS25 (SUTURE) ×4 IMPLANT
SYR 50ML LL SCALE MARK (SYRINGE) IMPLANT
TOWEL OR 17X26 10 PK STRL BLUE (TOWEL DISPOSABLE) ×4 IMPLANT
TRAY FOLEY CATH 14FRSI W/METER (CATHETERS) ×2 IMPLANT
WATER STERILE IRR 1500ML POUR (IV SOLUTION) ×2 IMPLANT
WRAP KNEE MAXI GEL POST OP (GAUZE/BANDAGES/DRESSINGS) ×8 IMPLANT

## 2012-12-13 NOTE — Transfer of Care (Signed)
Immediate Anesthesia Transfer of Care Note  Patient: Kent Ryan  Procedure(s) Performed: Procedure(s) (LRB): TOTAL KNEE BILATERAL (Bilateral)  Patient Location: PACU  Anesthesia Type: General and Epidural  Level of Consciousness: sedated, patient cooperative and responds to stimulaton  Airway & Oxygen Therapy: Patient Spontanous Breathing and Patient connected to face mask oxgen  Post-op Assessment: Report given to PACU RN and Post -op Vital signs reviewed and stable  Post vital signs: Reviewed and stable  Complications: No apparent anesthesia complications

## 2012-12-13 NOTE — Anesthesia Procedure Notes (Signed)
Epidural Patient location during procedure: holding area  Staffing Anesthesiologist: Phillips Grout Performed by: anesthesiologist   Preanesthetic Checklist Completed: patient identified, site marked, surgical consent, pre-op evaluation, timeout performed, IV checked, risks and benefits discussed, monitors and equipment checked and post-op pain management  Epidural Patient position: sitting Prep: Betadine Patient monitoring: heart rate, continuous pulse ox and blood pressure Approach: midline  Needle:  Needle type: Hustead  Needle gauge: 18 G Needle length: 9 cm and 9 Needle insertion depth: 7 cm Catheter type: closed end flexible Catheter size: 20 Guage Catheter at skin depth: 9 cm Test dose: negative and 1.5% lidocaine  Assessment Events: blood not aspirated, injection not painful, no injection resistance, negative IV test and no paresthesia  Additional Notes Test dose 1.5% Lidocaine with epi 1:200,000  Patient tolerated the insertion well without complications.Reason for block:post-op pain management

## 2012-12-13 NOTE — H&P (View-Only) (Signed)
Kent Ryan  DOB: 11/15/1950 Married / Language: English / Race: Black or African American Male  Date of Admission:  12/13/2012  Chief Complaint:  Bilateral Knee Pain  History of Present Illness The patient is a 61 year old male who comes in for a preoperative History and Physical. The patient is scheduled for a bilateral total knee arthroplasty to be performed by Dr. Frank V. Aluisio, MD at [facility] on 12/13/2012. The patient is a 61 year old male who presents with knee complaints. The patient is seen for both knees. The patient reports left knee and right knee symptoms including: pain, swelling, instability, giving way and stiffness which began year(s) ago without any known injury. Note for "Knee pain": He has had cortisone injections by his PCP and an orthopaedic in [place] in the past. He said they used to help, when he first started getting them. They no longer help. He said it may have been 2 years or more since his last cortisone injection. He had gout about a month ago and was treated by his PCP. Kent Ryan comes in today with a chief complaint of bilateral knee pain. He reports that he has had issues with this for several years. He has had no specific injuries to the knees. He has just had progressive worsening of arthritis. He has been treated for this previously both by his primary care physician and another orthopaedic physician in [place]. He says that he has had Cortisone injections over the years. He has not had Visco supplementation. He denies any history of surgery on the knees including arthroscopic procedures. He has had trouble with the right knee longer than he has with the left knee, but says currently they bother him about the same amount. Some days the left knee bothers him more. Some days the right knee bothers him more. He has pain at all times even at rest, even at night when he is laying in bed. He has significant issues with weightbearing almost  finding it difficult to work because of the inability to stand for long periods of time. He does have some instability in the knees, but says he has not fallen. In regards to the Cortisone injections he said when he first started those they were beneficial, but now they do not work for more than a few weeks at a time. He is aware that he needs to have surgery, but has not had the discussion about surgery with anyone yet. He denies groin pain. No numbness or tingling in the legs. No back issues.  He is ready to proceed with surgery at this time. They have been treated conservatively in the past for the above stated problem and despite conservative measures, they continue to have progressive pain and severe functional limitations and dysfunction. They have failed non-operative management including home exercise, medications, and injections. It is felt that they would benefit from undergoing total joint replacement. Risks and benefits of the procedure have been discussed with the patient and they elect to proceed with surgery. There are no active contraindications to surgery such as ongoing infection or rapidly progressive neurological disease.   Problem List Primary osteoarthritis of both knees (715.16)   Allergies No Known Drug Allergies. 09/02/2012   Family History Heart disease in male family member before age 65 First Degree Relatives. reported   Social History Illicit drug use. no Living situation. live with spouse Marital status. married Drug/Alcohol Rehab (Currently). no Drug/Alcohol Rehab (Previously). no Exercise. Exercises never Tobacco /   smoke exposure. no Tobacco use. never smoker Never smoker Number of flights of stairs before winded. less than 1 Pain Contract. yes Current work status. working part time Alcohol use. current drinker; drinks wine; only occasionally per week Children. 3 Post-Surgical Plans. Plan is to go to REHAB.   Medication  History Lisinopril-Hydrochlorothiazide (20-12.5MG Tablet, Oral) Active. Hydrocodone-Acetaminophen (5-300MG Tablet, Oral) Active. Diazepam (5MG Tablet, Oral) Active. Atorvastatin Calcium (40MG Tablet, Oral) Active. Ibuprofen (800MG Tablet, Oral) Active. Vitamin B Complex ( Oral) Active. Flonase (50MCG/ACT Suspension, Nasal) Active.   Past Surgical History Arthroscopy of Knee. bilateral Spinal Surgery   Medical History Gout. 2 episodes in the past Sleep Apnea. uses CPAP High blood pressure Hypercholesterolemia   Review of Systems General:Not Present- Chills, Fever, Night Sweats, Appetite Loss, Fatigue, Feeling sick, Weight Gain and Weight Loss. Skin:Not Present- Itching, Rash, Skin Color Changes, Ulcer, Psoriasis and Change in Hair or Nails. HEENT:Present- Hearing problems and Ringing in the Ears. Not Present- Sensitivity to light and Nose Bleed. Neck:Not Present- Swollen Glands and Neck Mass. Respiratory:Not Present- Snoring, Chronic Cough, Bloody sputum and Dyspnea. Cardiovascular:Present- Swelling of Extremities and Leg Cramps. Not Present- Shortness of Breath, Chest Pain and Palpitations. Gastrointestinal:Not Present- Bloody Stool, Heartburn, Abdominal Pain, Vomiting, Nausea and Incontinence of Stool. Male Genitourinary:Not Present- Blood in Urine, Frequency, Incontinence and Nocturia. Musculoskeletal:Present- Muscle Weakness, Muscle Pain, Joint Stiffness, Joint Swelling, Joint Pain and Back Pain. Neurological:Present- Tingling and Numbness. Not Present- Burning, Tremor, Headaches and Dizziness. Psychiatric:Not Present- Anxiety, Depression and Memory Loss. Endocrine:Not Present- Cold Intolerance, Heat Intolerance, Excessive hunger and Excessive Thirst. Hematology:Not Present- Abnormal Bleeding, Anemia, Blood Clots and Easy Bruising.   Vitals Pulse: 72 (Regular) Resp.: 16 (Unlabored) BP: 132/92 (Sitting, Right Arm, Standard)    Physical  Exam The physical exam findings are as follows:  Note: Patient is a 61 year old male with bilateral knee pain. Patient is accompanied today by his sister.   General Mental Status - Alert, cooperative and good historian. General Appearance- pleasant. Not in acute distress. Orientation- Oriented X3. Build & Nutrition- Well nourished and Well developed.   Head and Neck Head- normocephalic, atraumatic . Neck Global Assessment- supple. no bruit auscultated on the right and no bruit auscultated on the left.   Eye Pupil- Bilateral- Regular and Round. Motion- Bilateral- EOMI.   ENMT upper partial dentures  Chest and Lung Exam Auscultation: Breath sounds:- clear at anterior chest wall and - clear at posterior chest wall. Adventitious sounds:- No Adventitious sounds.   Cardiovascular Auscultation:Rhythm- Regular rate and rhythm. Heart Sounds- S1 WNL and S2 WNL. Murmurs & Other Heart Sounds:Auscultation of the heart reveals - No Murmurs.   Abdomen Inspection:Contour- Generalized mild distention. Palpation/Percussion:Tenderness- Abdomen is non-tender to palpation. Rigidity (guarding)- Abdomen is soft. Auscultation:Auscultation of the abdomen reveals - Bowel sounds normal.   Male Genitourinary Not done, not pertinent to present illness  Musculoskeletal He is alert and oriented in no acute distress. He has no effusion or instability about the knees. He has a significant genu varus deformity bilaterally. Normal painless range of motion in the hips. In regards to the right knee, range of motion is 5 to 110 degrees. Left knee is 5 to 105. He has significant patellofemoral crepitus in both knees. He is tender medially greater than laterally in both knees. The calves are soft, nontender. Sensation and circulation are intact in the lower extremities and the motor function is intact in the lower extremities.  RADIOGRAPHS: X-rays of the knees, AP view  shows significant genu varus   deformity in both knees as well as complete collapse of the medial compartment of both knees. He has significant periarticular osteophyte formation on the medial portion of the tibial plateau as well as the medial femoral condyle. On the lateral view of the right knee, he has significant osteophyte formation about the patella and he is bone on bone in that medial compartment. On the left knee, he is also bone on bone in the medial compartment as well as patellofemoral compartment.  Assessment & Plan Primary osteoarthritis of both knees (715.16)  Note: Plan is for a Bilateral Total Knee Replacements by Dr. Aluisio.  The patient does not have any contraindications and will recieve TXA (tranexamic acid) prior to surgery.  Plan is to go to Rehab following the hospital stay.  Signed electronically by Alexzandrew L Perkins, III PA-C  

## 2012-12-13 NOTE — Plan of Care (Signed)
Problem: Consults Goal: Diagnosis- Total Joint Replacement Outcome: Completed/Met Date Met:  12/13/12 Primary Total Knee

## 2012-12-13 NOTE — Anesthesia Preprocedure Evaluation (Addendum)
Anesthesia Evaluation  Patient identified by MRN, date of birth, ID band Patient awake    Reviewed: Allergy & Precautions, H&P , NPO status , Patient's Chart, lab work & pertinent test results  Airway Mallampati: II TM Distance: >3 FB Neck ROM: Full    Dental no notable dental hx. (+) Missing and Poor Dentition   Pulmonary sleep apnea and Continuous Positive Airway Pressure Ventilation ,  breath sounds clear to auscultation  Pulmonary exam normal       Cardiovascular hypertension, Pt. on medications Rhythm:Regular Rate:Normal     Neuro/Psych negative neurological ROS  negative psych ROS   GI/Hepatic negative GI ROS, Neg liver ROS,   Endo/Other  negative endocrine ROS  Renal/GU negative Renal ROS  negative genitourinary   Musculoskeletal negative musculoskeletal ROS (+)   Abdominal   Peds negative pediatric ROS (+)  Hematology negative hematology ROS (+)   Anesthesia Other Findings   Reproductive/Obstetrics negative OB ROS                          Anesthesia Physical Anesthesia Plan  ASA: II  Anesthesia Plan: Epidural   Post-op Pain Management:    Induction:   Airway Management Planned: Simple Face Mask  Additional Equipment:   Intra-op Plan:   Post-operative Plan:   Informed Consent: I have reviewed the patients History and Physical, chart, labs and discussed the procedure including the risks, benefits and alternatives for the proposed anesthesia with the patient or authorized representative who has indicated his/her understanding and acceptance.   Dental advisory given  Plan Discussed with: CRNA  Anesthesia Plan Comments: (Epidural for OR and post-op pain control)        Anesthesia Quick Evaluation

## 2012-12-13 NOTE — Interval H&P Note (Signed)
History and Physical Interval Note:  12/13/2012 11:32 AM  Kent Ryan  has presented today for surgery, with the diagnosis of Osteoarthritis of both knees  The various methods of treatment have been discussed with the patient and family. After consideration of risks, benefits and other options for treatment, the patient has consented to  Procedure(s): TOTAL KNEE BILATERAL (Bilateral) as a surgical intervention .  The patient's history has been reviewed, patient examined, no change in status, stable for surgery.  I have reviewed the patient's chart and labs.  Questions were answered to the patient's satisfaction.     Loanne Drilling

## 2012-12-13 NOTE — Anesthesia Postprocedure Evaluation (Signed)
  Anesthesia Post-op Note  Patient: Kent Ryan  Procedure(s) Performed: Procedure(s) (LRB): TOTAL KNEE BILATERAL (Bilateral)  Patient Location: PACU  Anesthesia Type: GA combined with regional for post-op pain  Level of Consciousness: awake and alert   Airway and Oxygen Therapy: Patient Spontanous Breathing  Post-op Pain: mild  Post-op Assessment: Post-op Vital signs reviewed, Patient's Cardiovascular Status Stable, Respiratory Function Stable, Patent Airway and No signs of Nausea or vomiting  Last Vitals:  Filed Vitals:   12/13/12 1634  BP:   Pulse:   Temp: 36.4 C  Resp: 12    Post-op Vital Signs: stable   Complications: No apparent anesthesia complications

## 2012-12-13 NOTE — Op Note (Signed)
Pre-operative diagnosis- Osteoarthritis  Bilateral knee(s)  Post-operative diagnosis- Osteoarthritis Bilateral knee(s)  Procedure-  Bilateral  Total Knee Arthroplasty  Surgeon- Gus Rankin. Jermel Artley, MD  Assistant- Avel Peace, PA-C   Anesthesia-  General and Epidural EBL-* No blood loss amount entered *  Drains Autovac x 1 each side  Tourniquet time-  Total Tourniquet Time Documented: Thigh (Right) - 49 minutes Total: Thigh (Right) - 49 minutes    Complications- None  Condition-PACU - hemodynamically stable.   Brief Clinical Note - Kent Ryan is a 62 y.o. year old male with end stage OA of both knees with progressively worsening pain and dysfunction. He has constant pain, with activity and at rest and significant functional deficits with difficulties even with ADLs. He has had extensive non-op management including analgesics, injections of cortisone and home exercise program, but remains in significant pain with significant dysfunction. We discussed pros and cons of doing both knees at the same setting vs one knee at a time including procedure, risks and potential complications with both and he chose to do both at the same time. Radiographs show bilateral severe tricomparmental OA with bone on bone all compartments.He presents now for bilateral TKA.   Procedure in detail---   The patient is brought into the operating room and positioned supine on the operating table. After successful administration of  General and Epidural,   a tourniquet is placed high on the  Bilateral thigh(s) and the lower extremities are prepped and draped in the usual sterile fashion. Time out is performed by the operating team and then the  Right lower extremity is wrapped in Esmarch, knee flexed and the tourniquet inflated to 300 mmHg.       A midline incision is made with a ten blade through the subcutaneous tissue to the level of the extensor mechanism. A fresh blade is used to make a medial parapatellar  arthrotomy. Soft tissue over the proximal medial tibia is subperiosteally elevated to the joint line with a knife and into the semimembranosus bursa with a Cobb elevator. Soft tissue over the proximal lateral tibia is elevated with attention being paid to avoiding the patellar tendon on the tibial tubercle. The patella is everted, knee flexed 90 degrees and the ACL and PCL are removed. Findings are tricompartmental bone on bone with massive global osteophytes        The drill is used to create a starting hole in the distal femur and the canal is thoroughly irrigated with sterile saline to remove the fatty contents. The 5 degree Right  valgus alignment guide is placed into the femoral canal and the distal femoral cutting block is pinned to remove 10 mm off the distal femur. Resection is made with an oscillating saw.      The tibia is subluxed forward and the menisci are removed. The extramedullary alignment guide is placed referencing proximally at the medial aspect of the tibial tubercle and distally along the second metatarsal axis and tibial crest. The block is pinned to remove 2mm off the more deficient medial  side. Resection is made with an oscillating saw. Size 5is the most appropriate size for the tibia and the proximal tibia is prepared with the modular drill and keel punch for that size.      The femoral sizing guide is placed and size 5 is most appropriate. Rotation is marked off the epicondylar axis and confirmed by creating a rectangular flexion gap at 90 degrees. The size 5 cutting block is pinned in this  rotation and the anterior, posterior and chamfer cuts are made with the oscillating saw. The intercondylar block is then placed and that cut is made.      Trial size 5 tibial component, trial size 5 posterior stabilized femur and a 12.5  mm posterior stabilized rotating platform insert trial is placed. Full extension is achieved with excellent varus/valgus and anterior/posterior balance throughout  full range of motion. The patella is everted and thickness measured to be 27  mm. Free hand resection is taken to 15 mm, a 41 template is placed, lug holes are drilled, trial patella is placed, and it tracks normally. Osteophytes are removed off the posterior femur with the trial in place. All trials are removed and the cut bone surfaces prepared with pulsatile lavage. Cement is mixed and once ready for implantation, the size 5 tibial implant, size  5 posterior stabilized femoral component, and the size 41 patella are cemented in place and the patella is held with the clamp. The trial insert is placed and the knee held in full extension. All extruded cement is removed and once the cement is hard the permanent 12.5  mm posterior stabilized rotating platform insert is placed into the tibial tray.      The wound is copiously irrigated with saline solution and the extensor mechanism closed over a autovac drain with #1 PDS suture. The tourniquet is released for a total tourniquet time of 45  minutes. Flexion against gravity is 135 degrees and the patella tracks normally. Subcutaneous tissue is closed with 2.0 vicryl and subcuticular with running 4.0 Monocryl. The leg is wrapped in a compressive wrap and the left knee then addressed.      The left lower extremity is wrapped in Esmarch, knee flexed and the tourniquet inflated to 300 mmHg.       A midline incision is made with a ten blade through the subcutaneous tissue to the level of the extensor mechanism. A fresh blade is used to make a medial parapatellar arthrotomy. Soft tissue over the proximal medial tibia is subperiosteally elevated to the joint line with a knife and into the semimembranosus bursa with a Cobb elevator. Soft tissue over the proximal lateral tibia is elevated with attention being paid to avoiding the patellar tendon on the tibial tubercle. The patella is everted, knee flexed 90 degrees and the ACL and PCL are removed. Findings are tricompartmental  bone on bone with massive global osteophytes.        The drill is used to create a starting hole in the distal femur and the canal is thoroughly irrigated with sterile saline to remove the fatty contents. The 5 degree Left  valgus alignment guide is placed into the femoral canal and the distal femoral cutting block is pinned to remove 10 mm off the distal femur. Resection is made with an oscillating saw.      The tibia is subluxed forward and the menisci are removed. The extramedullary alignment guide is placed referencing proximally at the medial aspect of the tibial tubercle and distally along the second metatarsal axis and tibial crest. The block is pinned to remove 2mm off the more deficient medial  side. Resection is made with an oscillating saw. Size 5is the most appropriate size for the tibia and the proximal tibia is prepared with the modular drill and keel punch for that size.      The femoral sizing guide is placed and size 5 is most appropriate. Rotation is marked off the epicondylar axis  and confirmed by creating a rectangular flexion gap at 90 degrees. The size 5 cutting block is pinned in this rotation and the anterior, posterior and chamfer cuts are made with the oscillating saw. The intercondylar block is then placed and that cut is made.      Trial size 5 tibial component, trial size 5 posterior stabilized femur and a 15  mm posterior stabilized rotating platform insert trial is placed. Full extension is achieved with excellent varus/valgus and anterior/posterior balance throughout full range of motion. The patella is everted and thickness measured to be 27  mm. Free hand resection is taken to 15 mm, a 41 template is placed, lug holes are drilled, trial patella is placed, and it tracks normally. Osteophytes are removed off the posterior femur with the trial in place. All trials are removed and the cut bone surfaces prepared with pulsatile lavage. Cement is mixed and once ready for implantation,  the size 5 tibial implant, size  5 posterior stabilized femoral component, and the size 41 patella are cemented in place and the patella is held with the clamp. The trial insert is placed and the knee held in full extension. The Exparel (20 ml mixed with 30 ml saline) and .25% Bupivicaine, are injected into the extensor mechanism, posterior capsule, medial and lateral gutters and subcutaneous tissues.  All extruded cement is removed and once the cement is hard the permanent 15 mm posterior stabilized rotating platform insert is placed into the tibial tray. The incisions are cleaned and dried and steri-strips and a bulky sterile dressing are applied. The limbs are placed into a knee immobilizer and the patient is awakened and transported to recovery in stable condition.      Please note that a surgical assistant was a medical necessity for this procedure in order to perform it in a safe and expeditious manner. Surgical assistant was necessary to retract the ligaments and vital neurovascular structures to prevent injury to them and also necessary for proper positioning of the limb to allow for anatomic placement of the prosthesis.   Gus Rankin Thadeus Gandolfi, MD    12/13/2012, 3:12 PM

## 2012-12-14 ENCOUNTER — Encounter (HOSPITAL_COMMUNITY): Payer: Self-pay | Admitting: Orthopedic Surgery

## 2012-12-14 DIAGNOSIS — M1710 Unilateral primary osteoarthritis, unspecified knee: Secondary | ICD-10-CM

## 2012-12-14 DIAGNOSIS — Z96659 Presence of unspecified artificial knee joint: Secondary | ICD-10-CM

## 2012-12-14 LAB — BASIC METABOLIC PANEL
BUN: 13 mg/dL (ref 6–23)
CO2: 23 mEq/L (ref 19–32)
Glucose, Bld: 153 mg/dL — ABNORMAL HIGH (ref 70–99)
Potassium: 3.8 mEq/L (ref 3.5–5.1)
Sodium: 133 mEq/L — ABNORMAL LOW (ref 135–145)

## 2012-12-14 LAB — PROTIME-INR: INR: 1.11 (ref 0.00–1.49)

## 2012-12-14 LAB — CBC
HCT: 38.4 % — ABNORMAL LOW (ref 39.0–52.0)
Hemoglobin: 12.5 g/dL — ABNORMAL LOW (ref 13.0–17.0)
MCH: 27.5 pg (ref 26.0–34.0)
RBC: 4.55 MIL/uL (ref 4.22–5.81)

## 2012-12-14 MED ORDER — WARFARIN SODIUM 2.5 MG PO TABS
2.5000 mg | ORAL_TABLET | Freq: Once | ORAL | Status: AC
  Administered 2012-12-14: 2.5 mg via ORAL
  Filled 2012-12-14: qty 1

## 2012-12-14 MED ORDER — WARFARIN SODIUM 7.5 MG PO TABS
7.5000 mg | ORAL_TABLET | Freq: Once | ORAL | Status: DC
  Filled 2012-12-14: qty 1

## 2012-12-14 MED ORDER — WARFARIN VIDEO
Freq: Once | Status: AC
  Administered 2012-12-15: 12:00:00

## 2012-12-14 MED ORDER — COUMADIN BOOK
1.0000 | Freq: Once | Status: AC
  Administered 2012-12-14: 1
  Filled 2012-12-14: qty 1

## 2012-12-14 MED ORDER — WARFARIN - PHARMACIST DOSING INPATIENT: Freq: Every day | Status: DC

## 2012-12-14 NOTE — Consult Note (Signed)
Physical Medicine and Rehabilitation Consult Reason for Consult: Bilateral total knee replacement Referring Physician: Dr.Alusio   HPI: Kent Ryan is a 62 y.o. right-handed male admitted 12/12/2012 with end-stage degenerative changes of both knees. No change with conservative care. Patient independent prior to admission. Underwent bilateral total knee replacements 12/13/2012 per Dr. Despina Hick. Placed on Coumadin for DVT prophylaxis and advised weightbearing as tolerated. Postoperative pain management. Physical therapy evaluation completed 12/14/2012 with recommendations for physical medicine rehabilitation consult to consider inpatient rehabilitation services   Review of Systems  Gastrointestinal: Positive for constipation.  Musculoskeletal: Positive for myalgias, back pain and joint pain.  All other systems reviewed and are negative.   Past Medical History  Diagnosis Date  . Hypertension   . Sleep apnea     cpap - does not know settings   . Arthritis    Past Surgical History  Procedure Laterality Date  . Back surgery  1980s    L4-5   . Torn cartilage- bilateral knees surgery     . Total knee arthroplasty Bilateral 12/13/2012    Procedure: TOTAL KNEE BILATERAL;  Surgeon: Loanne Drilling, MD;  Location: [facility];  Service: Orthopedics;  Laterality: Bilateral;  Right Knee first   History reviewed. No pertinent family history. Social History:  reports that he has quit smoking. He has never used smokeless tobacco. He reports that  drinks alcohol. He reports that he does not use illicit drugs. Allergies: No Known Allergies Medications Prior to Admission  Medication Sig Dispense Refill  . atorvastatin (LIPITOR) 40 MG tablet Take 40 mg by mouth daily.       . B Complex-C (SUPER B COMPLEX) TABS Take 1 tablet by mouth daily.      . diazepam (VALIUM) 5 MG tablet Take 5 mg by mouth every 6 (six) hours as needed (cramping).      . fluticasone (FLONASE) 50 MCG/ACT nasal spray Place 2 sprays  into the nose daily as needed for rhinitis.      Marland Kitchen ibuprofen (ADVIL,MOTRIN) 800 MG tablet Take 800 mg by mouth every 8 (eight) hours as needed for pain.      Marland Kitchen latanoprost (XALATAN) 0.005 % ophthalmic solution Place 1 drop into both eyes at bedtime.      Marland Kitchen lisinopril-hydrochlorothiazide (PRINZIDE,ZESTORETIC) 20-12.5 MG per tablet Take 1 tablet by mouth every morning.      . Naphazoline-Pheniramine (OPCON-A) 0.027-0.315 % SOLN Place 1 drop into both eyes 2 (two) times daily as needed (dry and itchy eyes).      Marland Kitchen oxyCODONE-acetaminophen (PERCOCET/ROXICET) 5-325 MG per tablet Take 1 tablet by mouth every 4 (four) hours as needed for pain.      Marland Kitchen HYDROcodone-acetaminophen (NORCO/VICODIN) 5-325 MG per tablet Take 1 tablet by mouth every 6 (six) hours as needed for pain.        Home: Home Living Lives With: Spouse Available Help at Discharge: Family Type of Home: House Home Adaptive Equipment: Straight cane Additional Comments: Pt planning on going to rehab following hospital stay  Functional History: Prior Function Able to Take Stairs?: Yes Driving: No Functional Status:  Mobility: Bed Mobility Bed Mobility: Supine to Sit Supine to Sit: 1: +2 Total assist;With rails;HOB elevated Supine to Sit: Patient Percentage: 40% Transfers Transfers: Sit to Stand;Stand to Sit;Stand Pivot Transfers Sit to Stand: 1: +2 Total assist;From elevated surface;From bed Sit to Stand: Patient Percentage: 40% Stand to Sit: 1: +2 Total assist;With upper extremity assist;With armrests;To chair/3-in-1 Stand to Sit: Patient Percentage: 40% Ambulation/Gait Ambulation/Gait Assistance:  Not tested (comment) Assistive device: Rolling walker Stairs: No Wheelchair Mobility Wheelchair Mobility: No  ADL:    Cognition: Cognition Overall Cognitive Status: Within Functional Limits for tasks assessed Arousal/Alertness: Awake/alert Orientation Level: Oriented X4 Cognition Arousal/Alertness: Awake/alert Behavior  During Therapy: WFL for tasks assessed/performed Overall Cognitive Status: Within Functional Limits for tasks assessed  Blood pressure 160/86, pulse 118, temperature 99.4 F (37.4 C), temperature source Oral, resp. rate 16, height 6\' 1"  (1.854 m), weight 113.399 kg (250 lb), SpO2 97.00%. Physical Exam  Vitals reviewed. Constitutional: He is oriented to person, place, and time. He appears well-developed.  A little drowsy  HENT:  Head: Normocephalic.  Eyes: EOM are normal.  Neck: Normal range of motion. Neck supple. No thyromegaly present.  Cardiovascular: Normal rate and regular rhythm.   Pulmonary/Chest: Breath sounds normal. No respiratory distress.  Abdominal: Soft. Bowel sounds are normal. He exhibits no distension.  Musculoskeletal:  Both knees dressed, swollen, appropriately tender.   Neurological: He is alert and oriented to person, place, and time.  UE 5/5 LE 1+ HF, 1 KE, 4/5 ankles. No sensory abnl  Skin:  Bilateral total knee replacements with dressing in place  Psychiatric: He has a normal mood and affect.    Results for orders placed during the hospital encounter of 12/13/12 (from the past 24 hour(s))  CBC     Status: Abnormal   Collection Time    12/14/12  4:51 AM      Result Value Range   WBC 11.2 (*) 4.0 - 10.5 K/uL   RBC 4.55  4.22 - 5.81 MIL/uL   Hemoglobin 12.5 (*) 13.0 - 17.0 g/dL   HCT 16.1 (*) 09.6 - 04.5 %   MCV 84.4  78.0 - 100.0 fL   MCH 27.5  26.0 - 34.0 pg   MCHC 32.6  30.0 - 36.0 g/dL   RDW 40.9  81.1 - 91.4 %   Platelets 213  150 - 400 K/uL  BASIC METABOLIC PANEL     Status: Abnormal   Collection Time    12/14/12  4:51 AM      Result Value Range   Sodium 133 (*) 135 - 145 mEq/L   Potassium 3.8  3.5 - 5.1 mEq/L   Chloride 103  96 - 112 mEq/L   CO2 23  19 - 32 mEq/L   Glucose, Bld 153 (*) 70 - 99 mg/dL   BUN 13  6 - 23 mg/dL   Creatinine, Ser 7.82  0.50 - 1.35 mg/dL   Calcium 9.7  8.4 - 95.6 mg/dL   GFR calc non Af Amer 87 (*) >90 mL/min    GFR calc Af Amer >90  >90 mL/min  PROTIME-INR     Status: None   Collection Time    12/14/12  4:51 AM      Result Value Range   Prothrombin Time 14.2  11.6 - 15.2 seconds   INR 1.11  0.00 - 1.49   Dg Chest 1 View  12/13/2012   *RADIOLOGY REPORT*  Clinical Data: Possible pulmonary nodule versus prominent nipple shadow.  CHEST - 1 VIEW  Comparison: 12/07/2012 radiographs.  Findings: PA view with nipple markers demonstrates the previously demonstrated nodular density on the left to correspond with the left nipple shadow.  There is no evidence of pulmonary nodule.  The lungs appear clear.  Heart size and mediastinal contours appear normal.  IMPRESSION: Previously demonstrated nodular density appears to represent the left nipple shadow.  No evidence of pulmonary nodule.  Original Report Authenticated By: Carey Bullocks, M.D.    Assessment/Plan: Diagnosis: OA of bilateral knees s/p TKA's 1. Does the need for close, 24 hr/day medical supervision in concert with the patient's rehab needs make it unreasonable for this patient to be served in a less intensive setting? Yes 2. Co-Morbidities requiring supervision/potential complications: pain, post-op anemia, wound care 3. Due to bladder management, bowel management, safety, skin/wound care, disease management, medication administration, pain management and patient education, does the patient require 24 hr/day rehab nursing? Yes 4. Does the patient require coordinated care of a physician, rehab nurse, PT (1-2 hrs/day, 5 days/week) and OT (1-2 hrs/day, 5 days/week) to address physical and functional deficits in the context of the above medical diagnosis(es)? Yes Addressing deficits in the following areas: balance, endurance, locomotion, strength, transferring, bowel/bladder control, bathing, dressing, feeding, grooming, toileting and psychosocial support 5. Can the patient actively participate in an intensive therapy program of at least 3 hrs of therapy per  day at least 5 days per week? Yes 6. The potential for patient to make measurable gains while on inpatient rehab is excellent 7. Anticipated functional outcomes upon discharge from inpatient rehab are mod I with PT, mod I with OT, n/a with SLP. 8. Estimated rehab length of stay to reach the above functional goals is: 7-10 days 9. Does the patient have adequate social supports to accommodate these discharge functional goals? Yes 10. Anticipated D/C setting: Home 11. Anticipated post D/C treatments: HH therapy to outpt therapies.  12. Overall Rehab/Functional Prognosis: excellent  RECOMMENDATIONS: This patient's condition is appropriate for continued rehabilitative care in the following setting: [facility] Patient has agreed to participate in recommended program. Yes Note that insurance prior authorization may be required for reimbursement for recommended care.  Comment: Pt lives his wife who has care needs of her own. He needs to be modified independent to return home. Rehab RN to follow up.   Ranelle Oyster, MD, [place] [facility]     12/14/2012

## 2012-12-14 NOTE — Progress Notes (Signed)
Utilization review completed.  

## 2012-12-14 NOTE — Progress Notes (Signed)
ANTICOAGULATION CONSULT NOTE - follow up  Pharmacy Consult for warfarin Indication: VTE prophylaxis  No Known Allergies  Patient Measurements: Height: 6\' 1"  (185.4 cm) Weight: 250 lb (113.399 kg) IBW/kg (Calculated) : 79.9   Vital Signs: Temp: 99.4 F (37.4 C) (06/17 1020) Temp src: Oral (06/17 1020) BP: 160/86 mmHg (06/17 1020) Pulse Rate: 118 (06/17 1020)  Labs:  Recent Labs  12/14/12 0451  HGB 12.5*  HCT 38.4*  PLT 213  LABPROT 14.2  INR 1.11  CREATININE 0.95    Estimated Creatinine Clearance: 106.4 ml/min (by C-G formula based on Cr of 0.95).   Medical History: Past Medical History  Diagnosis Date  . Hypertension   . Sleep apnea     cpap - does not know settings   . Arthritis     Medications:  Prescriptions prior to admission  Medication Sig Dispense Refill  . atorvastatin (LIPITOR) 40 MG tablet Take 40 mg by mouth daily.       . B Complex-C (SUPER B COMPLEX) TABS Take 1 tablet by mouth daily.      . diazepam (VALIUM) 5 MG tablet Take 5 mg by mouth every 6 (six) hours as needed (cramping).      . fluticasone (FLONASE) 50 MCG/ACT nasal spray Place 2 sprays into the nose daily as needed for rhinitis.      Marland Kitchen ibuprofen (ADVIL,MOTRIN) 800 MG tablet Take 800 mg by mouth every 8 (eight) hours as needed for pain.      Marland Kitchen latanoprost (XALATAN) 0.005 % ophthalmic solution Place 1 drop into both eyes at bedtime.      Marland Kitchen lisinopril-hydrochlorothiazide (PRINZIDE,ZESTORETIC) 20-12.5 MG per tablet Take 1 tablet by mouth every morning.      . Naphazoline-Pheniramine (OPCON-A) 0.027-0.315 % SOLN Place 1 drop into both eyes 2 (two) times daily as needed (dry and itchy eyes).      Marland Kitchen oxyCODONE-acetaminophen (PERCOCET/ROXICET) 5-325 MG per tablet Take 1 tablet by mouth every 4 (four) hours as needed for pain.      Marland Kitchen HYDROcodone-acetaminophen (NORCO/VICODIN) 5-325 MG per tablet Take 1 tablet by mouth every 6 (six) hours as needed for pain.       Scheduled:  . acetaminophen   1,000 mg Oral Q6H   Or  . acetaminophen  650 mg Rectal Q6H  . atorvastatin  40 mg Oral Daily  . coumadin book  1 each Does not apply Once  . dexamethasone  10 mg Oral Daily   Or  . dexamethasone  10 mg Intravenous Daily  . docusate sodium  100 mg Oral BID  . latanoprost  1 drop Both Eyes QHS  . scopolamine  1 patch Transdermal Once  . [START ON 12/15/2012] warfarin   Does not apply Once  . Warfarin - Pharmacist Dosing Inpatient   Does not apply q1800   Assessment: 62yo M s/p bilat TKA with EPIDURAL in place. Pharmacy was asked to start Coumadin on 6/17. Per ortho, plan is to remove epidural 6/18 and start Lovenox in the PM on 6/18.   Goal of Therapy:  INR 2-3    Plan:   Change Coumadin to 2.5mg  today. Give at noon. (Using low-dose while epidural in place).   F/u epidural removal/Lovenox timing tomorrow.  Check PT/INR daily.  Provide Coumadin education.  Charolotte Eke, PharmD, pager [phone]. 12/14/2012,10:40 AM.

## 2012-12-14 NOTE — Progress Notes (Signed)
CSW met with pt following PT Eval / Recommendations. Pt is interested in CIR following hospital d/c. CSW will assist with SNF placement if [facility] is unable to accept. Pt has given CSW permission to initiate SNF search. CSW will continue to follow until d/c plan is finalized.  Cori Razor LCSW [phone]

## 2012-12-14 NOTE — Evaluation (Signed)
Physical Therapy Evaluation Patient Details Name: Kent Ryan MRN: [id number] DOB: 03-Jun-1951 Today's Date: 12/14/2012 Time: 7829-5621 PT Time Calculation (min): 30 min  PT Assessment / Plan / Recommendation Clinical Impression  Pt presents s/p B TKA POD 1 with decreased strength, ROM and mobility.  Tolerated OOB to chair, however requires +2 assist with increased pain and fatigue noted.  Pt will benefit from skilled PT in acute venue to address deficits.  PT recommends CIR for follow up at D/C to maximize pts safety and function.     PT Assessment  Patient needs continued PT services    Follow Up Recommendations  CIR    Does the patient have the potential to tolerate intense rehabilitation      Barriers to Discharge Decreased caregiver support      Equipment Recommendations  Rolling walker with 5" wheels    Recommendations for Other Services OT consult   Frequency 7X/week    Precautions / Restrictions Precautions Precautions: Knee Precaution Comments: maintained KI on B knees due to pts inability to demonstrate quad control Required Braces or Orthoses: Knee Immobilizer - Right;Knee Immobilizer - Left Restrictions Weight Bearing Restrictions: No Other Position/Activity Restrictions: WBAT   Pertinent Vitals/Pain 10/10, RN notified, ice packs applied      Mobility  Bed Mobility Bed Mobility: Supine to Sit Supine to Sit: 1: +2 Total assist;With rails;HOB elevated Supine to Sit: Patient Percentage: 40% Details for Bed Mobility Assistance: Assist for BLE's out of bed and for trunk to attain full seated position.  Cues for hand placement on bed to self assist, however pt demonstrates increased difficulty utilizing UEs.  Transfers Transfers: Sit to Stand;Stand to Sit;Stand Pivot Transfers Sit to Stand: 1: +2 Total assist;From elevated surface;From bed Sit to Stand: Patient Percentage: 40% Stand to Sit: 1: +2 Total assist;With upper extremity assist;With armrests;To  chair/3-in-1 Stand to Sit: Patient Percentage: 40% Details for Transfer Assistance: Assist to rise, stabalize and ensure controlled descent with max cues for hand placement, safety and LE management when sitting/standing. Pt able to take several small steps from bed to chair with cues for sequencing/technqiue with RW.  Ambulation/Gait Ambulation/Gait Assistance: Not tested (comment) Assistive device: Rolling walker Stairs: No Wheelchair Mobility Wheelchair Mobility: No    Exercises     PT Diagnosis: Difficulty walking;Abnormality of gait;Generalized weakness;Acute pain  PT Problem List: Decreased strength;Decreased range of motion;Decreased activity tolerance;Decreased balance;Decreased mobility;Decreased coordination;Decreased safety awareness;Decreased knowledge of use of DME;Decreased knowledge of precautions;Pain PT Treatment Interventions: DME instruction;Gait training;Functional mobility training;Therapeutic activities;Therapeutic exercise;Balance training;Patient/family education   PT Goals Acute Rehab PT Goals PT Goal Formulation: With patient Time For Goal Achievement: 12/21/12 Potential to Achieve Goals: Good Pt will go Supine/Side to Sit: with min assist PT Goal: Supine/Side to Sit - Progress: Goal set today Pt will go Sit to Supine/Side: with min assist PT Goal: Sit to Supine/Side - Progress: Goal set today Pt will go Sit to Stand: with mod assist PT Goal: Sit to Stand - Progress: Goal set today Pt will go Stand to Sit: with mod assist PT Goal: Stand to Sit - Progress: Goal set today Pt will Ambulate: 16 - 50 feet;with mod assist;with rolling walker PT Goal: Ambulate - Progress: Goal set today Pt will Perform Home Exercise Program: with supervision, verbal cues required/provided PT Goal: Perform Home Exercise Program - Progress: Goal set today  Visit Information  Last PT Received On: 12/14/12 Assistance Needed: +2    Subjective Data  Subjective: You are a mean  girl Patient  Stated Goal: to go to rehab   Prior Functioning  Home Living Lives With: Spouse Available Help at Discharge: Family Type of Home: House Home Adaptive Equipment: Straight cane Additional Comments: Pt planning on going to rehab following hospital stay Prior Function Level of Independence: Independent Able to Take Stairs?: Yes Driving: No Communication Communication: No difficulties    Cognition  Cognition Arousal/Alertness: Awake/alert Behavior During Therapy: WFL for tasks assessed/performed Overall Cognitive Status: Within Functional Limits for tasks assessed    Extremity/Trunk Assessment Right Lower Extremity Assessment RLE ROM/Strength/Tone: Deficits RLE ROM/Strength/Tone Deficits: ankle motion WFL, unable to perform SLR without assist.  RLE Sensation: WFL - Light Touch Left Lower Extremity Assessment LLE ROM/Strength/Tone: Deficits LLE ROM/Strength/Tone Deficits: ankle motion WFL, unable to perform SLR without assist.  LLE Sensation: WFL - Light Touch Trunk Assessment Trunk Assessment: Normal   Balance    End of Session PT - End of Session Equipment Utilized During Treatment: Gait belt;Right knee immobilizer;Left knee immobilizer Activity Tolerance: Patient limited by fatigue;Patient limited by pain Patient left: in chair;with call bell/phone within reach Nurse Communication: Mobility status  GP     Vista Deck 12/14/2012, 10:04 AM

## 2012-12-14 NOTE — Progress Notes (Signed)
Rehab Admissions Coordinator Note:  Patient was screened by Brock Ra for appropriateness for an Inpatient Acute Rehab Consult.  At this time, we are recommending Inpatient Rehab consult.  Brock Ra 12/14/2012, 10:15 AM  I can be reached at [phone].

## 2012-12-14 NOTE — Progress Notes (Signed)
ANTICOAGULATION CONSULT NOTE - Initial Consult  Pharmacy Consult for warfarin Indication: VTE prophylaxis  No Known Allergies  Patient Measurements: Height: 6\' 1"  (185.4 cm) Weight: 250 lb (113.399 kg) IBW/kg (Calculated) : 79.9 Heparin Dosing Weight:   Vital Signs: Temp: 98.7 F (37.1 C) (06/17 0130) Temp src: Oral (06/16 1730) BP: 120/76 mmHg (06/17 0130) Pulse Rate: 89 (06/17 0130)  Labs: No results found for this basename: HGB, HCT, PLT, APTT, LABPROT, INR, HEPARINUNFRC, CREATININE, CKTOTAL, CKMB, TROPONINI,  in the last 72 hours  CrCl is unknown because no creatinine reading has been taken.   Medical History: Past Medical History  Diagnosis Date  . Hypertension   . Sleep apnea     cpap - does not know settings   . Arthritis     Medications:  Prescriptions prior to admission  Medication Sig Dispense Refill  . atorvastatin (LIPITOR) 40 MG tablet Take 40 mg by mouth daily.       . B Complex-C (SUPER B COMPLEX) TABS Take 1 tablet by mouth daily.      . diazepam (VALIUM) 5 MG tablet Take 5 mg by mouth every 6 (six) hours as needed (cramping).      . fluticasone (FLONASE) 50 MCG/ACT nasal spray Place 2 sprays into the nose daily as needed for rhinitis.      Marland Kitchen ibuprofen (ADVIL,MOTRIN) 800 MG tablet Take 800 mg by mouth every 8 (eight) hours as needed for pain.      Marland Kitchen latanoprost (XALATAN) 0.005 % ophthalmic solution Place 1 drop into both eyes at bedtime.      Marland Kitchen lisinopril-hydrochlorothiazide (PRINZIDE,ZESTORETIC) 20-12.5 MG per tablet Take 1 tablet by mouth every morning.      . Naphazoline-Pheniramine (OPCON-A) 0.027-0.315 % SOLN Place 1 drop into both eyes 2 (two) times daily as needed (dry and itchy eyes).      Marland Kitchen oxyCODONE-acetaminophen (PERCOCET/ROXICET) 5-325 MG per tablet Take 1 tablet by mouth every 4 (four) hours as needed for pain.      Marland Kitchen HYDROcodone-acetaminophen (NORCO/VICODIN) 5-325 MG per tablet Take 1 tablet by mouth every 6 (six) hours as needed for pain.        Scheduled:  . acetaminophen  1,000 mg Oral Q6H   Or  . acetaminophen  650 mg Rectal Q6H  . atorvastatin  40 mg Oral Daily  . dexamethasone  10 mg Oral Daily   Or  . dexamethasone  10 mg Intravenous Daily  . docusate sodium  100 mg Oral BID  . latanoprost  1 drop Both Eyes QHS  . scopolamine  1 patch Transdermal Once    Assessment: Patient with bilat total knee replacement.  Goal of Therapy:  INR 2-3    Plan:  Start with Coumadin 7.5 mg tonight. Check PT/INR daily. Provide Coumadin education.   Darlina Guys, Jacquenette Shone Crowford 12/14/2012,3:13 AM

## 2012-12-14 NOTE — Progress Notes (Signed)
Physical Therapy Treatment Patient Details Name: Kent Ryan MRN: [id number] DOB: 12-18-1950 Today's Date: 12/14/2012 Time: 5784-6962 PT Time Calculation (min): 34 min  PT Assessment / Plan / Recommendation Comments on Treatment Session  Pt able to stand pivot back to bed with B knee immobilizers on this afternoon and perform bed exercises.      Follow Up Recommendations  CIR     Does the patient have the potential to tolerate intense rehabilitation     Barriers to Discharge        Equipment Recommendations  Rolling walker with 5" wheels    Recommendations for Other Services OT consult  Frequency 7X/week   Plan Discharge plan remains appropriate    Precautions / Restrictions Precautions Precautions: Knee Precaution Comments: maintained KI on B knees due to pts inability to demonstrate quad control Required Braces or Orthoses: Knee Immobilizer - Right;Knee Immobilizer - Left Knee Immobilizer - Right: Discontinue once straight leg raise with < 10 degree lag Knee Immobilizer - Left: Discontinue once straight leg raise with < 10 degree lag Restrictions Weight Bearing Restrictions: No Other Position/Activity Restrictions: WBAT bilaterally   Pertinent Vitals/Pain 7/10 pain, RN notified, ice packs applied    Mobility  Bed Mobility Bed Mobility: Sit to Supine Sit to Supine: 1: +2 Total assist;HOB flat Sit to Supine: Patient Percentage: 50% Details for Bed Mobility Assistance: Assist for BLEs into bed and to ensure controlled descent of trunk into bed with cues for adjusting hips once in bed Transfers Transfers: Sit to Stand;Stand to Sit;Stand Pivot Transfers Sit to Stand: 1: +2 Total assist;From elevated surface;From bed Sit to Stand: Patient Percentage: 40% Stand to Sit: 1: +2 Total assist;With upper extremity assist;With armrests;To chair/3-in-1 Stand to Sit: Patient Percentage: 40% Stand Pivot Transfers: 1: +2 Total assist Stand Pivot Transfers: Patient Percentage:  40% Details for Transfer Assistance: Assist to rise, stabalize and ensure controlled descent with max cues for hand placement, safety and LE management when sitting/standing. Again, pt able to take several small steps from chair to bed with cues for sequencing/technqiue with RW.  Ambulation/Gait Ambulation/Gait Assistance: Not tested (comment)    Exercises Total Joint Exercises Ankle Circles/Pumps: AROM;Both;20 reps Quad Sets: AROM;Both;10 reps Heel Slides: AAROM;Both;10 reps Straight Leg Raises: AAROM;Both;10 reps Goniometric ROM: L knee flex approx 50, R knee flex approx 40   PT Diagnosis:    PT Problem List:   PT Treatment Interventions:     PT Goals Acute Rehab PT Goals PT Goal Formulation: With patient Time For Goal Achievement: 12/21/12 Potential to Achieve Goals: Good Pt will go Sit to Supine/Side: with min assist PT Goal: Sit to Supine/Side - Progress: Progressing toward goal Pt will go Sit to Stand: with mod assist PT Goal: Sit to Stand - Progress: Progressing toward goal Pt will go Stand to Sit: with mod assist PT Goal: Stand to Sit - Progress: Progressing toward goal Pt will Perform Home Exercise Program: with supervision, verbal cues required/provided PT Goal: Perform Home Exercise Program - Progress: Progressing toward goal  Visit Information  Last PT Received On: 12/14/12 Assistance Needed: +2    Subjective Data  Subjective: I actually feel better after yall see me.  Patient Stated Goal: to go to rehab   Cognition  Cognition Arousal/Alertness: Awake/alert Behavior During Therapy: WFL for tasks assessed/performed Overall Cognitive Status: Within Functional Limits for tasks assessed    Balance     End of Session PT - End of Session Equipment Utilized During Treatment: Gait belt;Right knee immobilizer;Left knee  immobilizer Activity Tolerance: Patient limited by fatigue;Patient limited by pain Patient left: in bed;with call bell/phone within reach Nurse  Communication: Mobility status   GP     Vista Deck 12/14/2012, 5:44 PM

## 2012-12-14 NOTE — Progress Notes (Signed)
Patient appearing very uncomfortable this pm with pain scale 9/10 at time of exam. Epidural infusion decreased to 10cc per hour by nursing staff last night. Patient indicates that very uncomfortable last night, requiring multiple supplement doses of IV pain medication. Epidural level at L1-2 at time of exam. Will increase infusion to 14cc per hour and supplement with IV analgesic as needed. Catheter appears intact and well secured.

## 2012-12-14 NOTE — Progress Notes (Signed)
Clinical Social Work Department BRIEF PSYCHOSOCIAL ASSESSMENT 12/14/2012  Patient:  Kent Ryan, Kent Ryan     Account Number:  [id number]     Admit date:  12/13/2012  Clinical Social Worker:  Candie Chroman  Date/Time:  12/14/2012 08:09 AM  Referred by:  Physician  Date Referred:  12/14/2012 Referred for  SNF Placement   Other Referral:   Interview type:  Patient Other interview type:    PSYCHOSOCIAL DATA Living Status:  WIFE Admitted from facility:   Level of care:   Primary support name:  [place] Primary support relationship to patient:  SPOUSE Degree of support available:   supportive    CURRENT CONCERNS Current Concerns  Post-Acute Placement   Other Concerns:    SOCIAL WORK ASSESSMENT / PLAN Pt is a 62 yr old gentleman living at home prior to hospitalization. CSW met with pt this am to assist with d/c planning. PT Eval is pending. CSW will meet with pt again following PT recommendations. Pt is interested in ST Rehab ( ? CIR vs SNF ? ). CSW will return later today to continue assisting with d/c planning.   Assessment/plan status:  Psychosocial Support/Ongoing Assessment of Needs Other assessment/ plan:   Possible CIR vs SNF   Information/referral to community resources:   None at this time.    PATIENT'S/FAMILY'S RESPONSE TO PLAN OF CARE: Pt agreed to meet with CSW later today once PT recommendations are available.   Cori Razor LCSW [phone]

## 2012-12-14 NOTE — Progress Notes (Signed)
   Subjective: 1 Day Post-Op Procedure(s) (LRB): TOTAL KNEE BILATERAL (Bilateral) Patient reports pain as moderate.   Patient seen in rounds for Dr. Lequita Halt. The right legg is hurting more than the left leg. Patient is well, but has had some minor complaints of pain in the right knee, requiring pain medications We will start therapy today.  Plan is to go CIR versus SNF after hospital stay.  Objective: Vital signs in last 24 hours: Temp:  [97.3 F (36.3 C)-98.7 F (37.1 C)] 98.5 F (36.9 C) (06/17 1610) Pulse Rate:  [57-99] 99 (06/17 0633) Resp:  [12-19] 14 (06/17 0633) BP: (85-167)/(54-97) 167/97 mmHg (06/17 0633) SpO2:  [86 %-100 %] 99 % (06/17 0633) FiO2 (%):  [98 %] 98 % (06/16 1730) Weight:  [113.399 kg (250 lb)] 113.399 kg (250 lb) (06/16 1730)  Intake/Output from previous day:  Intake/Output Summary (Last 24 hours) at 12/14/12 0858 Last data filed at 12/14/12 0847  Gross per 24 hour  Intake 6663.33 ml  Output   1575 ml  Net 5088.33 ml    Intake/Output this shift: Total I/O In: 240 [P.O.:240] Out: -   Labs:  Recent Labs  12/14/12 0451  HGB 12.5*    Recent Labs  12/14/12 0451  WBC 11.2*  RBC 4.55  HCT 38.4*  PLT 213    Recent Labs  12/14/12 0451  NA 133*  K 3.8  CL 103  CO2 23  BUN 13  CREATININE 0.95  GLUCOSE 153*  CALCIUM 9.7    Recent Labs  12/14/12 0451  INR 1.11    EXAM General - Patient is Alert, Appropriate and Oriented Extremity - Neurovascular intact Sensation intact distally Dorsiflexion/Plantar flexion intact Dressing - dressing C/D/I to both knees Motor Function - intact, moving feet and toes well on exam.  Both Hemovacs pulled without difficulty.  Past Medical History  Diagnosis Date  . Hypertension   . Sleep apnea     cpap - does not know settings   . Arthritis     Assessment/Plan: 1 Day Post-Op Procedure(s) (LRB): TOTAL KNEE BILATERAL (Bilateral) Principal Problem:   OA (osteoarthritis) of  knee  Estimated body mass index is 32.99 kg/(m^2) as calculated from the following:   Height as of this encounter: 6\' 1"  (1.854 m).   Weight as of this encounter: 113.399 kg (250 lb). Advance diet Up with therapy Discharge to SNF versus CIR Continue foley for now.  Will keep foley until tomorrow and will not be removed until at least 6-8 hours following the removal of the epidural catheter.  DVT Prophylaxis - Lovenox and Coumadin, Lovenox will not start until tomorrow afternoon following removal of the epidural. First dose of Coumadin this evening. Weight-Bearing as tolerated to both leg  No vaccines.  Continue O2 and Pulse OX   Take Coumadin for four weeks and then discontinue.  The dose may need to be adjusted based upon the INR.  Please follow the INR and titrate Coumadin dose for a therapeutic range between 2.0 and 3.0 INR.  After completing the four weeks of Coumadin, the patient may stop the Coumadin and resume their 81 mg Aspirin daily.  Lovenox injections will start tomorrow evening after the epidural has been removed and continue until the INR is therapeutic at or greater than 2.0.  When INR reaches the therapeutic level of equal to or greater than 2.0, the patient may discontinue the Lovenox injections.  Kent Ryan 12/14/2012, 8:58 AM

## 2012-12-15 LAB — BASIC METABOLIC PANEL
BUN: 11 mg/dL (ref 6–23)
CO2: 26 mEq/L (ref 19–32)
Chloride: 102 mEq/L (ref 96–112)
Glucose, Bld: 142 mg/dL — ABNORMAL HIGH (ref 70–99)
Potassium: 4.4 mEq/L (ref 3.5–5.1)

## 2012-12-15 LAB — CBC
HCT: 34.2 % — ABNORMAL LOW (ref 39.0–52.0)
Hemoglobin: 11 g/dL — ABNORMAL LOW (ref 13.0–17.0)
MCV: 84 fL (ref 78.0–100.0)
WBC: 18.1 10*3/uL — ABNORMAL HIGH (ref 4.0–10.5)

## 2012-12-15 MED ORDER — OXYCODONE HCL 5 MG PO TABS
5.0000 mg | ORAL_TABLET | ORAL | Status: DC | PRN
  Administered 2012-12-15 – 2012-12-17 (×9): 10 mg via ORAL
  Administered 2012-12-18: 15 mg via ORAL
  Administered 2012-12-18 (×3): 10 mg via ORAL
  Administered 2012-12-19: 15 mg via ORAL
  Administered 2012-12-19: 10 mg via ORAL
  Administered 2012-12-19: 15 mg via ORAL
  Administered 2012-12-20 (×2): 10 mg via ORAL
  Filled 2012-12-15: qty 3
  Filled 2012-12-15: qty 2
  Filled 2012-12-15: qty 3
  Filled 2012-12-15 (×6): qty 2
  Filled 2012-12-15 (×3): qty 3
  Filled 2012-12-15 (×2): qty 2
  Filled 2012-12-15: qty 1
  Filled 2012-12-15 (×5): qty 2

## 2012-12-15 MED ORDER — HYDROMORPHONE HCL PF 1 MG/ML IJ SOLN
0.5000 mg | INTRAMUSCULAR | Status: DC | PRN
  Administered 2012-12-15 – 2012-12-21 (×5): 1 mg via INTRAVENOUS
  Filled 2012-12-15 (×5): qty 1

## 2012-12-15 MED ORDER — WARFARIN SODIUM 5 MG PO TABS
5.0000 mg | ORAL_TABLET | Freq: Once | ORAL | Status: AC
  Filled 2012-12-15: qty 1

## 2012-12-15 MED ORDER — METOCLOPRAMIDE HCL 5 MG/ML IJ SOLN
10.0000 mg | Freq: Four times a day (QID) | INTRAMUSCULAR | Status: AC
  Administered 2012-12-15 (×4): 10 mg via INTRAVENOUS
  Filled 2012-12-15 (×3): qty 2

## 2012-12-15 MED ORDER — BISACODYL 10 MG RE SUPP
10.0000 mg | Freq: Once | RECTAL | Status: AC
  Administered 2012-12-15: 10 mg via RECTAL
  Filled 2012-12-15: qty 1

## 2012-12-15 MED ORDER — ACETAMINOPHEN 10 MG/ML IV SOLN
1000.0000 mg | Freq: Four times a day (QID) | INTRAVENOUS | Status: AC
  Administered 2012-12-15 – 2012-12-16 (×3): 1000 mg via INTRAVENOUS
  Filled 2012-12-15 (×4): qty 100

## 2012-12-15 MED ORDER — ALUM & MAG HYDROXIDE-SIMETH 200-200-20 MG/5ML PO SUSP
30.0000 mL | ORAL | Status: DC | PRN
  Administered 2012-12-15 – 2012-12-18 (×3): 30 mL via ORAL
  Filled 2012-12-15 (×3): qty 30

## 2012-12-15 NOTE — Progress Notes (Signed)
ANTICOAGULATION CONSULT NOTE - follow up  Pharmacy Consult for warfarin Indication: VTE prophylaxis  No Known Allergies  Patient Measurements: Height: 6\' 1"  (185.4 cm) Weight: 250 lb (113.399 kg) IBW/kg (Calculated) : 79.9   Vital Signs: Temp: 98.7 F (37.1 C) (06/18 0547) BP: 147/94 mmHg (06/18 0547) Pulse Rate: 112 (06/18 0547)  Labs:  Recent Labs  12/14/12 0451 12/15/12 0450  HGB 12.5* 11.0*  HCT 38.4* 34.2*  PLT 213 202  LABPROT 14.2 16.1*  INR 1.11 1.32  CREATININE 0.95 0.93    Estimated Creatinine Clearance: 108.7 ml/min (by C-G formula based on Cr of 0.93).   Medications:  Scheduled:  . atorvastatin  40 mg Oral Daily  . bisacodyl  10 mg Rectal Once  . docusate sodium  100 mg Oral BID  . latanoprost  1 drop Both Eyes QHS  . metoCLOPramide (REGLAN) injection  10 mg Intravenous Q6H  . scopolamine  1 patch Transdermal Once  . warfarin   Does not apply Once  . Warfarin - Pharmacist Dosing Inpatient   Does not apply q1800   Assessment: 61yo M s/p bilat TKA with EPIDURAL in place. Pharmacy was asked to start warfarin on 6/17. Per ortho, plan is to remove epidural 6/18 and start Lovenox 12 hours later.  INR (1.3) is increased, but still subtherapeutic.  Low dose warfarin used while Epidural in place.    Epidural is removed (without complication, tip intact) 6/18 at 3:15pm.  Hgb (11) decreased, Plt wnl.  Warfarin education started 6/17.  Goal of Therapy:  INR 2-3    Plan:   Warfarin 5mg  PO today at 1800.  If ordered, Lovenox to begin 12 hrs after epidural removal.  Check PT/INR daily.  F/U warfarin education.  Lynann Beaver PharmD, BCPS Pager [phone] 12/15/2012 3:25 PM

## 2012-12-15 NOTE — Progress Notes (Signed)
Subjective: 2 Days Post-Op Procedure(s) (LRB): TOTAL KNEE BILATERAL (Bilateral) Patient reports pain as mild and moderate.   Patient seen in rounds with Dr. Lequita Halt. Patient is well, but has had some minor complaints of pain in the knees, requiring pain medications Epidural to come out today.  Anticipate possible increase in pain temporarily after the epidural has been removed. Plan is to go Rehab after hospital stay.  Objective: Vital signs in last 24 hours: Temp:  [98.3 F (36.8 C)-99.4 F (37.4 C)] 98.7 F (37.1 C) (06/18 0547) Pulse Rate:  [108-118] 112 (06/18 0547) Resp:  [14-23] 22 (06/18 0547) BP: (147-181)/(82-100) 147/94 mmHg (06/18 0547) SpO2:  [95 %-98 %] 97 % (06/18 0547)  Intake/Output from previous day:  Intake/Output Summary (Last 24 hours) at 12/15/12 0904 Last data filed at 12/15/12 0548  Gross per 24 hour  Intake 2648.33 ml  Output   1925 ml  Net 723.33 ml    Intake/Output this shift:    Labs:  Recent Labs  12/14/12 0451 12/15/12 0450  HGB 12.5* 11.0*    Recent Labs  12/14/12 0451 12/15/12 0450  WBC 11.2* 18.1*  RBC 4.55 4.07*  HCT 38.4* 34.2*  PLT 213 202    Recent Labs  12/14/12 0451 12/15/12 0450  NA 133* 135  K 3.8 4.4  CL 103 102  BUN 13 11  CREATININE 0.95 0.93  GLUCOSE 153* 142*  CALCIUM 9.7 10.4    Recent Labs  12/14/12 0451 12/15/12 0450  INR 1.11 1.32    EXAM General - Patient is Alert, Appropriate and Oriented Extremity - Neurovascular intact Sensation intact distally Dorsiflexion/Plantar flexion intact No cellulitis present Dressing/Incision - clean, dry, no drainage, healing to both knees Motor Function - intact, moving feet and toes well on exam.    Past Medical History  Diagnosis Date  . Hypertension   . Sleep apnea     cpap - does not know settings   . Arthritis     Assessment/Plan: 2 Days Post-Op Procedure(s) (LRB): TOTAL KNEE BILATERAL (Bilateral) Principal Problem:   OA  (osteoarthritis) of knee  Estimated body mass index is 32.99 kg/(m^2) as calculated from the following:   Height as of this encounter: 6\' 1"  (1.854 m).   Weight as of this encounter: 113.399 kg (250 lb). Up with therapy Continue foley due to urinary output monitoring and she still has her epidural in place.  Will remove the catheter six hours after the epidural is removed.  Will need to note in chart when the epidural is pulled.  DVT Prophylaxis - Lovenox and Coumadin, Lovenox will not start until later this afternoon though after the epidural has been removed for twelve hours.  Will need to note in chart when the epidural is pulled. Anticipate possible increase in pain temporarily after the epidural has been removed.  Weight-Bearing as tolerated to both legs  Take Coumadin for four weeks and then discontinue.  The dose may need to be adjusted based upon the INR.  Please follow the INR and titrate Coumadin dose for a therapeutic range between 2.0 and 3.0 INR.  After completing the four weeks of Coumadin, the patient may stop the Coumadin and resume their 81 mg Aspirin daily.  Lovenox injections will start tomorrow evening after the epidural has been removed and continue until the INR is therapeutic at or greater than 2.0.  When INR reaches the therapeutic level of equal to or greater than 2.0, the patient may discontinue the Lovenox injections.  Roxane Puerto 12/15/2012, 9:04 AM

## 2012-12-15 NOTE — Progress Notes (Signed)
Physical Therapy Treatment Patient Details Name: Kent Ryan MRN: [id number] DOB: 01/09/1951 Today's Date: 12/15/2012 Time: 4098-1191 PT Time Calculation (min): 24 min  PT Assessment / Plan / Recommendation Comments on Treatment Session  POD # 2 B TKR pm seesion.  Pt vomited his lunch and was c/o max naueas.  Increased assistance needed to get back to bed. Pt required increased time to position to comfort.    Follow Up Recommendations  CIR     Does the patient have the potential to tolerate intense rehabilitation     Barriers to Discharge        Equipment Recommendations  Rolling walker with 5" wheels    Recommendations for Other Services    Frequency 7X/week   Plan Discharge plan remains appropriate    Precautions / Restrictions Precautions Precautions: Knee Precaution Comments: maintained KI on B knees due to pts inability to demonstrate quad control Required Braces or Orthoses: Knee Immobilizer - Right;Knee Immobilizer - Left Knee Immobilizer - Right: Discontinue once straight leg raise with < 10 degree lag Knee Immobilizer - Left: Discontinue once straight leg raise with < 10 degree lag Restrictions Weight Bearing Restrictions: No Other Position/Activity Restrictions: WBAT bilaterally   Pertinent Vitals/Pain C/o MAX nausea RN aware    Mobility  Bed Mobility Bed Mobility: Sit to Supine Supine to Sit: 1: +2 Total assist;With rails;HOB elevated Supine to Sit: Patient Percentage: 50% Sit to Supine: 1: +2 Total assist Sit to Supine: Patient Percentage: 20% Details for Bed Mobility Assistance: increased assist back to bed 2nd c/o nausea Transfers Transfers: Sit to Stand;Stand to Sit Sit to Stand: 1: +2 Total assist;From bed;From chair/3-in-1 Sit to Stand: Patient Percentage: 10% Stand to Sit: 1: +2 Total assist;With upper extremity assist;With armrests;To bed Stand to Sit: Patient Percentage: 20% Details for Transfer Assistance: increased assist off lower level  recliner using a bed sheet to pull trunk forward due to B LE KI's and c/o nausea. Ambulation/Gait Ambulation/Gait Assistance: 1: +2 Total assist Ambulation Distance (Feet): 3 Feet Assistive device: Rolling walker Ambulation/Gait Assistance Details: increased difficulty weight shifting and increased time ti pivot 1/4 from recliner to bed with 50% VC's on safety. Gait Pattern: Step-to pattern;Wide base of support;Decreased step length - right;Decreased step length - left;Shuffle Gait velocity: decreased     PT Goals                                                            progressing    Visit Information  Last PT Received On: 12/15/12 Assistance Needed: +2    Subjective Data  Subjective: I feel nausea Patient Stated Goal: to go to rehab   Cognition  Cognition Arousal/Alertness: Awake/alert Behavior During Therapy: WFL for tasks assessed/performed Overall Cognitive Status: Within Functional Limits for tasks assessed    Balance     End of Session PT - End of Session Equipment Utilized During Treatment: Gait belt;Right knee immobilizer;Left knee immobilizer Activity Tolerance: Patient limited by fatigue;Patient limited by pain (nausea) Patient left: with call bell/phone within reach;in bed;with family/visitor present   Felecia Shelling  PTA [facility]  Acute  Rehab Pager      [phone]

## 2012-12-15 NOTE — Progress Notes (Signed)
Physical Therapy Treatment Patient Details Name: Kent Ryan MRN: [id number] DOB: 03/18/1951 Today's Date: 12/15/2012 Time: 1020-1050 PT Time Calculation (min): 30 min  PT Assessment / Plan / Recommendation Comments on Treatment Session  POD # 2 B TKR am session.  Applied B KI's and instructed pt on proper use and when to D/C.  Assisted pt OOB + 2 assist with increased time and support to B LE's until pt able to scoot enough to EOB.  Amb limited distance due to pain.  Positioned in recliner.  Pt plans to D/C ro CIR for Rehab.     Follow Up Recommendations  CIR     Does the patient have the potential to tolerate intense rehabilitation     Barriers to Discharge        Equipment Recommendations  Rolling walker with 5" wheels    Recommendations for Other Services    Frequency 7X/week   Plan Discharge plan remains appropriate    Precautions / Restrictions Precautions Precautions: Knee Precaution Comments: maintained KI on B knees due to pts inability to demonstrate quad control Required Braces or Orthoses: Knee Immobilizer - Right;Knee Immobilizer - Left Knee Immobilizer - Right: Discontinue once straight leg raise with < 10 degree lag Knee Immobilizer - Left: Discontinue once straight leg raise with < 10 degree lag Restrictions Weight Bearing Restrictions: No Other Position/Activity Restrictions: WBAT bilaterally   Pertinent Vitals/Pain C/o 8/10 b knee pain meds requested    Mobility  Bed Mobility Bed Mobility: Sit to Supine Supine to Sit: 1: +2 Total assist;With rails;HOB elevated Supine to Sit: Patient Percentage: 50% Details for Bed Mobility Assistance: Assisted B LE off bed with support and increased time to scoot to EOB.  Transfers Transfers: Sit to Stand;Stand to Sit Sit to Stand: 1: +2 Total assist;From elevated surface;From bed Sit to Stand: Patient Percentage: 40% Stand to Sit: 1: +2 Total assist;With upper extremity assist;With armrests;To chair/3-in-1 Stand  to Sit: Patient Percentage: 40% Details for Transfer Assistance: Assist to rise 2nd B KI's elevated surface.  Increased time to steady self. Extra assist to control stand to sit 2nd B KI's.  Ambulation/Gait Ambulation/Gait Assistance: 1: +2 Total assist Ambulation Distance (Feet): 22 Feet Assistive device: Rolling walker Ambulation/Gait Assistance Details: B KI's and increased time to weight shift demon wide BOS as pt was able to take small steps. 3rd assist following with recliner.  Gait Pattern: Step-to pattern;Wide base of support;Decreased step length - right;Decreased step length - left;Shuffle Gait velocity: decreased     PT Goals                                                    progressing    Visit Information  Last PT Received On: 12/15/12 Assistance Needed: +1    Subjective Data      Cognition       Balance     End of Session PT - End of Session Equipment Utilized During Treatment: Gait belt;Right knee immobilizer;Left knee immobilizer Activity Tolerance: Patient limited by fatigue;Patient limited by pain Patient left: in chair;with call bell/phone within reach   Felecia Shelling  PTA [facility]  Acute  Rehab Pager      [phone]

## 2012-12-15 NOTE — Progress Notes (Signed)
Pt having much continued pain along with nausea & vomiting. PA notified & orders received. Kent Ryan, [employer]

## 2012-12-15 NOTE — Progress Notes (Signed)
Placed pt on CPAP. Pt brought own mask and tubing from home. Setting is 8cmH2O per home setting. Patient tolerating well!

## 2012-12-15 NOTE — Evaluation (Signed)
Occupational Therapy Evaluation Patient Details Name: Rhythm Wigfall MRN: [id number] DOB: Oct 30, 1950 Today's Date: 12/15/2012 Time: 4098-1191 OT Time Calculation (min): 22 min  OT Assessment / Plan / Recommendation Clinical Impression  This 62 year old man is s/p bil TKA.  At time of eval, he was limited by stomach discomfort and nausea.  He was independent with ADLs prior to admission.  Pt will benefit from skilled OT to increase independence with adls and bathroom transfers.    OT Assessment  Patient needs continued OT Services    Follow Up Recommendations  CIR    Barriers to Discharge      Equipment Recommendations  3 in 1 bedside comode    Recommendations for Other Services    Frequency  Min 2X/week    Precautions / Restrictions Precautions Precautions: Knee Precaution Comments: maintained KI on B knees due to pts inability to demonstrate quad control Required Braces or Orthoses: Knee Immobilizer - Right;Knee Immobilizer - Left Knee Immobilizer - Right: Discontinue once straight leg raise with < 10 degree lag Knee Immobilizer - Left: Discontinue once straight leg raise with < 10 degree lag Restrictions Weight Bearing Restrictions: No Other Position/Activity Restrictions: WBAT bilaterally   Pertinent Vitals/Pain Initially just stomach discomfort. With transfer, bil knees 7/10.  RN brought meds and repositioned    ADL  Grooming: Set up Where Assessed - Grooming: Supported sitting Upper Body Bathing: Supervision/safety (guarding assist with lines) Where Assessed - Upper Body Bathing: Supported sitting Lower Body Bathing: +2 Total assistance Lower Body Bathing: Patient Percentage: 20% Where Assessed - Lower Body Bathing: Supported sit to stand Upper Body Dressing: Minimal assistance Where Assessed - Upper Body Dressing: Unsupported sitting Lower Body Dressing: +2 Total assistance Lower Body Dressing: Patient Percentage: 0% Where Assessed - Lower Body Dressing:  Supported sit to stand Toilet Transfer: Simulated;+2 Total assistance Toilet Transfer: Patient Percentage:  (10% to stand, 60% for steps) Toilet Transfer Method: Surveyor, minerals:  (chair to bed) Toileting - Architect and Hygiene: +2 Total assistance Toileting - Clothing Manipulation and Hygiene: Patient Percentage: 10% Equipment Used: Rolling walker Transfers/Ambulation Related to ADLs: Pt performed spt to bed ADL Comments: educated on AE but did not demonstrate today.  Pt uncomfortable and feeling nauseaus.  Will continue education.      OT Diagnosis: Generalized weakness  OT Problem List: Decreased strength;Decreased activity tolerance;Decreased knowledge of use of DME or AE;Pain OT Treatment Interventions: Self-care/ADL training;DME and/or AE instruction;Therapeutic activities;Patient/family education;Therapeutic exercise   OT Goals Acute Rehab OT Goals OT Goal Formulation: With patient Time For Goal Achievement: 12/22/12 Potential to Achieve Goals: Good ADL Goals Pt Will Perform Lower Body Bathing: with 2+ total assist;Sit to stand from chair;with adaptive equipment (pt 70% for bathing and 50% for sit to stand) ADL Goal: Lower Body Bathing - Progress: Goal set today Pt Will Transfer to Toilet: with 2+ total assist;3-in-1;Stand pivot transfer (pt 70% for transfer and 60% for sit to stand) ADL Goal: Toilet Transfer - Progress: Goal set today Miscellaneous OT Goals Miscellaneous OT Goal #1: Pt will be independent with theraband exercises to increase strength for transfers OT Goal: Miscellaneous Goal #1 - Progress: Goal set today  Visit Information  Last OT Received On: 12/15/12 Assistance Needed: +2 PT/OT Co-Evaluation/Treatment: Yes    Subjective Data  Subjective: I just feel nauseaus now Patient Stated Goal: none stated.  Agreeable to OT/PT   Prior Functioning     Home Living Lives With: Spouse Additional Comments: Pt planning on going  to rehab following hospital stay Prior Function Level of Independence: Independent Communication Communication: No difficulties         Vision/Perception     Cognition  Cognition Arousal/Alertness: Awake/alert Behavior During Therapy: WFL for tasks assessed/performed Overall Cognitive Status: Within Functional Limits for tasks assessed    Extremity/Trunk Assessment Right Upper Extremity Assessment RUE ROM/Strength/Tone: Within functional levels Left Upper Extremity Assessment LUE ROM/Strength/Tone: Within functional levels (strength grossly 4-/5)     Mobility Bed Mobility Bed Mobility: Sit to Supine  Sit to Supine: 1: +2 Total assist Sit to Supine: Patient Percentage: 20% Details for Bed Mobility Assistance: increased assist back to bed 2nd c/o nausea Transfers Sit to Stand: 1: +2 Total assist;From bed;From chair/3-in-1 Sit to Stand: Patient Percentage: 10% Details for Transfer Assistance: increased assist off lower level recliner using a bed sheet to pull trunk forward due to B LE KI's and c/o nausea.     Exercise     Balance     End of Session OT - End of Session Activity Tolerance: Patient limited by pain Patient left: in bed;with call bell/phone within reach;with family/visitor present  GO     Tonio Seider 12/15/2012, 2:24 PM Marica Otter, OTR/L [phone] 12/15/2012

## 2012-12-15 NOTE — Progress Notes (Signed)
Kent Ryan is POD#2 after bilateral knee arthroplasties.  He has been up walking. Pain has been difficult to control. No back pain.  INR 1.32  Back: epidural was pulled out without difficulty. Tip of the epidural is intact. Site is clear and non-tender. Band-aid was applied. Ladona Ridgel, RN, and pharmacist have been notified.

## 2012-12-16 ENCOUNTER — Inpatient Hospital Stay (HOSPITAL_COMMUNITY): Payer: BC Managed Care – PPO

## 2012-12-16 DIAGNOSIS — E871 Hypo-osmolality and hyponatremia: Secondary | ICD-10-CM

## 2012-12-16 DIAGNOSIS — D62 Acute posthemorrhagic anemia: Secondary | ICD-10-CM

## 2012-12-16 DIAGNOSIS — K9189 Other postprocedural complications and disorders of digestive system: Secondary | ICD-10-CM

## 2012-12-16 LAB — BASIC METABOLIC PANEL
BUN: 14 mg/dL (ref 6–23)
CO2: 26 mEq/L (ref 19–32)
Chloride: 102 mEq/L (ref 96–112)
Creatinine, Ser: 0.9 mg/dL (ref 0.50–1.35)
GFR calc Af Amer: >90 mL/min (ref >90)

## 2012-12-16 LAB — CBC
Hemoglobin: 9.5 g/dL — ABNORMAL LOW (ref 13.0–17.0)
MCH: 27.5 pg (ref 26.0–34.0)
MCHC: 32.9 g/dL (ref 30.0–36.0)
RDW: 12.9 % (ref 11.5–15.5)

## 2012-12-16 LAB — PROTIME-INR: Prothrombin Time: 14.4 seconds (ref 11.6–15.2)

## 2012-12-16 MED ORDER — CHLORPROMAZINE HCL 10 MG PO TABS
10.0000 mg | ORAL_TABLET | Freq: Three times a day (TID) | ORAL | Status: DC | PRN
  Administered 2012-12-16 – 2012-12-21 (×5): 10 mg via ORAL
  Filled 2012-12-16 (×5): qty 1

## 2012-12-16 MED ORDER — ENOXAPARIN SODIUM 30 MG/0.3ML ~~LOC~~ SOLN
30.0000 mg | Freq: Two times a day (BID) | SUBCUTANEOUS | Status: DC
  Administered 2012-12-16 – 2012-12-21 (×11): 30 mg via SUBCUTANEOUS
  Filled 2012-12-16 (×13): qty 0.3

## 2012-12-16 MED ORDER — METOCLOPRAMIDE HCL 5 MG/ML IJ SOLN
10.0000 mg | Freq: Four times a day (QID) | INTRAMUSCULAR | Status: AC
  Administered 2012-12-16 – 2012-12-17 (×3): 10 mg via INTRAVENOUS
  Filled 2012-12-16 (×3): qty 2

## 2012-12-16 MED ORDER — WARFARIN SODIUM 5 MG PO TABS
5.0000 mg | ORAL_TABLET | Freq: Once | ORAL | Status: AC
  Administered 2012-12-16: 5 mg via ORAL
  Filled 2012-12-16: qty 1

## 2012-12-16 MED ORDER — POLYETHYLENE GLYCOL 3350 17 G PO PACK
17.0000 g | PACK | Freq: Every day | ORAL | Status: DC
  Administered 2012-12-16 – 2012-12-21 (×5): 17 g via ORAL
  Filled 2012-12-16 (×4): qty 1

## 2012-12-16 MED ORDER — FLEET ENEMA 7-19 GM/118ML RE ENEM: 1.0000 | ENEMA | Freq: Every day | RECTAL | Status: DC | PRN

## 2012-12-16 MED ORDER — FLEET ENEMA 7-19 GM/118ML RE ENEM
1.0000 | ENEMA | Freq: Once | RECTAL | Status: AC
  Administered 2012-12-16: 09:00:00 via RECTAL
  Filled 2012-12-16: qty 1

## 2012-12-16 NOTE — Progress Notes (Addendum)
Subjective: 3 Days Post-Op Procedure(s) (LRB): TOTAL KNEE BILATERAL (Bilateral) Patient reports pain as moderate.   Patient seen in rounds by Dr. Lequita Halt. Complaints of belly pain.  No bowel movements. Patient is having problems with constipation and pain in the knees, requiring pain medications Epidural came out yesterday.  Problems with pain control yesterday.  Due to nausea and poor pain control, was given IV Ofirmev yesterday.  Pain still moderate but a little better today. Plan is to go Rehab after hospital stay.  Objective: Vital signs in last 24 hours: Temp:  [98.1 F (36.7 C)-98.7 F (37.1 C)] 98.1 F (36.7 C) (06/19 0500) Pulse Rate:  [108-123] 108 (06/19 0500) Resp:  [20] 20 (06/19 0500) BP: (151-161)/(74-86) 156/79 mmHg (06/19 0500) SpO2:  [86 %-99 %] 94 % (06/19 0500)  Intake/Output from previous day:  Intake/Output Summary (Last 24 hours) at 12/16/12 0800 Last data filed at 12/16/12 0437  Gross per 24 hour  Intake 1620.83 ml  Output   1401 ml  Net 219.83 ml    Intake/Output this shift: UOP 500 since MN  Labs:  Recent Labs  12/14/12 0451 12/15/12 0450 12/16/12 0550  HGB 12.5* 11.0* 9.5*    Recent Labs  12/14/12 0451 12/15/12 0450 12/16/12 0550  WBC 11.2* 18.1* 15.4*  RBC 4.55 4.07* 3.46*  HCT 38.4* 34.2* 28.9*  PLT 213 202 191    Recent Labs  12/14/12 0451 12/15/12 0450 12/16/12 0550  NA 133* 135 135  K 3.8 4.4 4.5  CL 103 102 102  BUN 13 11 14   CREATININE 0.95 0.93 0.90  GLUCOSE 153* 142* 125*  CALCIUM 9.7 10.4 10.1    Recent Labs  12/14/12 0451 12/15/12 0450 12/16/12 0550  INR 1.11 1.32 1.14    EXAM General - Patient is Alert, Appropriate and Oriented Extremity - Neurovascular intact Sensation intact distally Dorsiflexion/Plantar flexion intact No cellulitis present Dressing/Incision - clean, dry, no drainage, healing Motor Function - intact, moving feet and toes well on exam.  Abdomen - distended this  morning.  KUB Clinical Data: Abdominal pain and distention. Nausea and vomiting.  ABDOMEN - 1 VIEW  Comparison: None.  Findings: There is extensive air throughout the slightly distended  colon. There is no small bowel dilatation but there is air in the  small bowel and stomach.  No acute osseous abnormality.  IMPRESSION:  Ileus.   Past Medical History  Diagnosis Date  . Hypertension   . Sleep apnea     cpap - does not know settings   . Arthritis     Assessment/Plan: 3 Days Post-Op Procedure(s) (LRB): TOTAL KNEE BILATERAL (Bilateral) Principal Problem:   OA (osteoarthritis) of knee Active Problems:   Ileus, postoperative   Postoperative anemia due to acute blood loss   Hyponatremia  Estimated body mass index is 32.99 kg/(m^2) as calculated from the following:   Height as of this encounter: 6\' 1"  (1.854 m).   Weight as of this encounter: 113.399 kg (250 lb). Up with therapy Foley out this morning.  Ileus - NPO, Enema this morning, continue Reglan, schedule Miralax.  No vomiting so hold hold on NGT tube for now. Follow BMETs for the next two days.  Repeat KUB tomorrow AM.  DVT Prophylaxis - Lovenox and Coumadin, Lovenox to start this morning for coverage until coumadin therapeutic.  Weight-Bearing as tolerated to both legs  Take Coumadin for four weeks and then discontinue.  The dose may need to be adjusted based upon the INR.  Please  follow the INR and titrate Coumadin dose for a therapeutic range between 2.0 and 3.0 INR.  After completing the four weeks of Coumadin, the patient may stop the Coumadin and resume their 81 mg Aspirin daily.  Lovenox injections to start today and continue until the INR is therapeutic at or greater than 2.0.  When INR reaches the therapeutic level of equal to or greater than 2.0, the patient may discontinue the Lovenox injections.  PERKINS, ALEXZANDREW 12/16/2012, 8:00 AM

## 2012-12-16 NOTE — Progress Notes (Signed)
ANTICOAGULATION CONSULT NOTE - follow up  Pharmacy Consult for warfarin Indication: VTE prophylaxis  No Known Allergies  Patient Measurements: Height: 6\' 1"  (185.4 cm) Weight: 250 lb (113.399 kg) IBW/kg (Calculated) : 79.9   Vital Signs: Temp: 98.1 F (36.7 C) (06/19 0500) Temp src: Oral (06/19 0500) BP: 156/79 mmHg (06/19 0500) Pulse Rate: 108 (06/19 0500)  Labs:  Recent Labs  12/14/12 0451 12/15/12 0450 12/16/12 0550  HGB 12.5* 11.0* 9.5*  HCT 38.4* 34.2* 28.9*  PLT 213 202 191  LABPROT 14.2 16.1* 14.4  INR 1.11 1.32 1.14  CREATININE 0.95 0.93 0.90    Estimated Creatinine Clearance: 112.3 ml/min (by C-G formula based on Cr of 0.9).   Medications:  Scheduled:  . atorvastatin  40 mg Oral Daily  . docusate sodium  100 mg Oral BID  . enoxaparin (LOVENOX) injection  30 mg Subcutaneous BID  . latanoprost  1 drop Both Eyes QHS  . scopolamine  1 patch Transdermal Once  . sodium phosphate  1 enema Rectal Once  . warfarin  5 mg Oral ONCE-1800  . Warfarin - Pharmacist Dosing Inpatient   Does not apply q1800   Assessment: 61yo M s/p bilat TKA with EPIDURAL in place. Pharmacy was asked to start warfarin on 6/17.   Epidural was removed (without complication, tip intact) 6/18 at 3:15pm.  Lovenox 30mg  SQ BID was started 6/19 at 0645  INR (1.14) remains subtherapeutic.  Low dose warfarin given 6/17 d/t epidural, warfarin dose 6/18 was ordered, but not given.  Hgb (9.5) decreased, Plt wnl.  No bleeding is reported  Warfarin education started 6/17.  Goal of Therapy:  INR 2-3    Plan:   Warfarin 5mg  PO today at 1800.  Continue Lovenox dosing per MD.  Check PT/INR daily.  F/U warfarin education.  Lynann Beaver PharmD, BCPS Pager [phone] 12/16/2012 8:35 AM

## 2012-12-16 NOTE — Progress Notes (Signed)
Physical Therapy Treatment Patient Details Name: Kent Ryan MRN: [id number] DOB: 1951-03-02 Today's Date: 12/16/2012 Time: 1914-7829 PT Time Calculation (min): 32 min  PT Assessment / Plan / Recommendation Comments on Treatment Session  POD # 3 B TKR with Post Op ileus currently NPO.  Assisted pt off BSC then amb in hallway.  Performed B LE TE's then applied ICE. Pt plans to D/C to CIR.    Follow Up Recommendations  CIR     Does the patient have the potential to tolerate intense rehabilitation     Barriers to Discharge        Equipment Recommendations  Rolling walker with 5" wheels    Recommendations for Other Services    Frequency 7X/week   Plan Discharge plan remains appropriate    Precautions / Restrictions Precautions Precautions: Knee Precaution Comments: maintained KI on B knees due to pts inability to demonstrate quad control Required Braces or Orthoses: Knee Immobilizer - Right;Knee Immobilizer - Left Knee Immobilizer - Right: Discontinue once straight leg raise with < 10 degree lag Knee Immobilizer - Left: Discontinue once straight leg raise with < 10 degree lag Restrictions Weight Bearing Restrictions: No Other Position/Activity Restrictions: WBAT bilaterally   Pertinent Vitals/Pain C/o 8/10 B knee pain ICE applied    Mobility  Bed Mobility Bed Mobility: Not assessed Details for Bed Mobility Assistance: Pt on BSC on arrival Transfers Transfers: Sit to Stand;Stand to Sit Sit to Stand: 1: +2 Total assist;From elevated surface;From toilet Sit to Stand: Patient Percentage: 30% Stand to Sit: 1: +2 Total assist;With upper extremity assist;With armrests;To chair/3-in-1 Details for Transfer Assistance: extra assist off [facility] Ambulation/Gait Ambulation/Gait Assistance: 1: +2 Total assist Ambulation Distance (Feet): 65 Feet Assistive device: Rolling walker Ambulation/Gait Assistance Details: increased time with mild c/o nausea but better than yesterday Gait  Pattern: Step-to pattern;Wide base of support;Decreased step length - right;Decreased step length - left;Shuffle Gait velocity: decreased    Exercises   B LE's Total Knee Replacement TE's 10 reps B LE ankle pumps 10 reps knee presses 10 reps heel slides  5 reps SAQ's 10 reps SLR's 10 reps ABD Followed by ICE   PT Goals                                                       progressing    Visit Information  Last PT Received On: 12/16/12 Assistance Needed: +2    Subjective Data      Cognition       Balance     End of Session PT - End of Session Equipment Utilized During Treatment: Gait belt;Right knee immobilizer;Left knee immobilizer Activity Tolerance: Patient limited by fatigue;Patient limited by pain Patient left: with call bell/phone within reach;with family/visitor present;in chair   Felecia Shelling  PTA [facility]  Acute  Rehab Pager      [phone]

## 2012-12-16 NOTE — Progress Notes (Signed)
Physical Therapy Treatment Patient Details Name: Kent Ryan MRN: [id number] DOB: [birth year] Today's Date: 12/16/2012 Time: 7846-9629 PT Time Calculation (min): 22 min  PT Assessment / Plan / Recommendation Comments on Treatment Session  POD # 3 B TKR pm session.  Pt requesting to go back to bed.  Amb second time from recliner to bed.    Follow Up Recommendations  CIR     Does the patient have the potential to tolerate intense rehabilitation     Barriers to Discharge        Equipment Recommendations  Rolling walker with 5" wheels    Recommendations for Other Services    Frequency 7X/week   Plan Discharge plan remains appropriate    Precautions / Restrictions Precautions Precautions: Knee Precaution Comments: maintained KI on B knees due to pts inability to demonstrate quad control Required Braces or Orthoses: Knee Immobilizer - Right;Knee Immobilizer - Left Knee Immobilizer - Right: Discontinue once straight leg raise with < 10 degree lag Knee Immobilizer - Left: Discontinue once straight leg raise with < 10 degree lag Restrictions Weight Bearing Restrictions: No Other Position/Activity Restrictions: WBAT bilaterally   Pertinent Vitals/Pain C/o 4/10 and weakness    Mobility  Bed Mobility Bed Mobility: Sit to Supine Sit to Supine: 1: +2 Total assist Sit to Supine: Patient Percentage: 50% Details for Bed Mobility Assistance: MAX assist to support B LE's up onto bed Transfers Transfers: Sit to Stand;Stand to Sit Sit to Stand: 1: +2 Total assist;From chair/3-in-1;From elevated surface Sit to Stand: Patient Percentage: 30% Stand to Sit: 1: +2 Total assist;With upper extremity assist;With armrests;To bed Details for Transfer Assistance: MAX assist to support B LE's as pt sat Ambulation/Gait Ambulation/Gait Assistance: 1: +2 Total assist Ambulation Distance (Feet): 26 Feet Assistive device: Rolling walker Ambulation/Gait Assistance Details: amb from recliner to  bed Gait Pattern: Step-to pattern;Wide base of support;Decreased step length - right;Decreased step length - left;Shuffle Gait velocity: decreased     PT Goals                                                   progressing    Visit Information  Last PT Received On: 12/16/12 Assistance Needed: +2    Subjective Data      Cognition       Balance     End of Session PT - End of Session Equipment Utilized During Treatment: Gait belt;Right knee immobilizer;Left knee immobilizer Activity Tolerance: Patient limited by fatigue;Patient limited by pain Patient left: with call bell/phone within reach;with family/visitor present;in chair   Felecia Shelling  PTA [facility]  Acute  Rehab Pager      [phone]

## 2012-12-16 NOTE — Progress Notes (Signed)
Rehab admissions - Evaluated for possible admission.  I spoke with patient.  He is very nauseated this am.  Each time he speaks, he feels he needs to vomit.  I gave him rehab booklets and I called his wife for information.  Wife is disabled and manages from a wheelchair.  Neighbors are bringing wife food.  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  Wife concerned that patient will need help with meals when he returns home.  Patient has [employer].  I have called and faxed information to them requesting inpatient rehab admission.  Patient not medically ready for rehab today due to ileus, nausea and vomiting.  Will check back tomorrow for medical readiness.  Call me for questions.  #[phone]

## 2012-12-16 NOTE — Care Management Note (Signed)
    Page 1 of 1   12/16/2012     11:14:09 AM   CARE MANAGEMENT NOTE 12/16/2012  Patient:  Kent Ryan, Kent Ryan   Account Number:  [id number]  Date Initiated:  12/16/2012  Documentation initiated by:  Colleen Can  Subjective/Objective Assessment:   DX OSTEOARTHRITIS BOTH KNEES; BILATERA; KNEE REPLACEMENT     Action/Plan:   CM SPOKE WITH PATIENT. 1ST CHOICE IS FOR INPATIENT REHAB, 2ND CHOICE SNF   Anticipated DC Date:  12/18/2012   Anticipated DC Plan:  IP REHAB FACILITY  In-house referral  Clinical Social Worker      DC Planning Services  CM consult      PAC Choice  IP REHAB   Choice offered to / List presented to:  C-1 Patient           Status of service:  Completed, signed off Medicare Important Message given?   (If response is "NO", the following Medicare IM given date fields will be blank) Date Medicare IM given:   Date Additional Medicare IM given:    Discharge Disposition:    Per UR Regulation:    If discussed at Long Length of Stay Meetings, dates discussed:    Comments:

## 2012-12-16 NOTE — PMR Pre-admission (Signed)
PMR Admission Coordinator Pre-Admission Assessment  Patient: Kent Ryan is an 62 y.o., male MRN: [id number] DOB: 11-15-1950 Height: 6\' 1"  (185.4 cm) Weight: 113.399 kg (250 lb)              Insurance Information HMO:      PPO: Yes     PCP:       IPA:       80/20:       OTHER:   PRIMARY: [employer] [place]      Policy#: [id number]      Subscriber: Gearldine Bienenstock  CM Name: Colin Broach.      Phone#: [phone]     Fax#: [phone] Pre-Cert#: [id number]    Cert for 7 days  Employer: self emlployed. Policy is Obamacare Benefits:  Phone #: [phone]     Name: Gilmer Mor. Date: 06/30/12     Deduct: $500 (met$231.84)      Out of Pocket Max: $700 (met all)      Life Max: None CIR: 70% w/auth      SNF: 70% w/auth  60 days max Outpatient: 70%  30 visit max     Co-Pay: 30% Home Health: 70% w/auth      Co-Pay: 30% DME: 70%     Co-Pay: 30% Providers: in network  Emergency Contact Information Contact Information   Name Relation Home Work Mobile   [place] Spouse [phone]     [place], Nofziger   [phone]     Current Medical History  Patient Admitting Diagnosis:  B TKR  History of Present Illness:  A 62 y.o. right-handed male admitted 12/12/2012 with end-stage degenerative changes of both knees. No change with conservative care. Patient independent prior to admission. Underwent bilateral total knee replacements 12/13/2012 per Dr. Despina Hick. Placed on Coumadin for DVT prophylaxis and advised weightbearing as tolerated. Postoperative pain management.   Ileus postoperative and on full liquid diet 12/21/12. Had enema on 6/23 with good results.  6/25 passing flatus and moving bowels better. Abdomen decompressed. To begin cardiac diet.  Past Medical History  Past Medical History  Diagnosis Date  . Hypertension   . Sleep apnea     cpap - does not know settings   . Arthritis     Family History  family history is not on file.  Prior Rehab/Hospitalizations: Had in hospital  rehab after back surgery 30 yrs ago.   Current Medications  Current facility-administered medications:0.9 %  sodium chloride infusion, , Intravenous, Continuous, Loanne Drilling, MD, Last Rate: 100 mL/hr at 12/17/12 0910, 100 mL/hr at 12/17/12 0910;  alum & mag hydroxide-simeth (MAALOX/MYLANTA) 200-200-20 MG/5ML suspension 30 mL, 30 mL, Oral, Q4H PRN, Thea Gist, PA-C, 30 mL at 12/15/12 2145;  atorvastatin (LIPITOR) tablet 40 mg, 40 mg, Oral, Daily, Loanne Drilling, MD, 40 mg at 12/17/12 0845 bisacodyl (DULCOLAX) suppository 10 mg, 10 mg, Rectal, Daily PRN, Loanne Drilling, MD;  chlorproMAZINE (THORAZINE) tablet 10 mg, 10 mg, Oral, TID PRN, Aggie Hacker L Stilwell, PA-C, 10 mg at 12/16/12 2019;  diphenhydrAMINE (BENADRYL) 12.5 MG/5ML elixir 12.5-25 mg, 12.5-25 mg, Oral, Q4H PRN, Loanne Drilling, MD;  diphenhydrAMINE (BENADRYL) capsule 25 mg, 25 mg, Oral, Q4H PRN, Phillips Grout, MD diphenhydrAMINE (BENADRYL) injection 12.5 mg, 12.5 mg, Intravenous, Q4H PRN, Phillips Grout, MD;  diphenhydrAMINE (BENADRYL) injection 25 mg, 25 mg, Intramuscular, Q4H PRN, Phillips Grout, MD;  docusate sodium (COLACE) capsule 100 mg, 100 mg, Oral, BID, Loanne Drilling, MD, 100 mg at 12/17/12 0845;  enoxaparin (LOVENOX)  injection 30 mg, 30 mg, Subcutaneous, BID, Loanne Drilling, MD, 30 mg at 12/17/12 0846 fluticasone (FLONASE) 50 MCG/ACT nasal spray 2 spray, 2 spray, Each Nare, Daily PRN, Loanne Drilling, MD;  HYDROmorphone (DILAUDID) injection 0.5-1 mg, 0.5-1 mg, Intravenous, Q1H PRN, Alexzandrew Perkins, PA-C, 1 mg at 12/15/12 1700;  latanoprost (XALATAN) 0.005 % ophthalmic solution 1 drop, 1 drop, Both Eyes, QHS, Loanne Drilling, MD, 1 drop at 12/16/12 2221;  lip balm (BLISTEX) ointment, , , ,  menthol-cetylpyridinium (CEPACOL) lozenge 3 mg, 1 lozenge, Oral, PRN, Loanne Drilling, MD;  methocarbamol (ROBAXIN) 500 mg in dextrose 5 % 50 mL IVPB, 500 mg, Intravenous, Q6H PRN, Loanne Drilling, MD, 500 mg at 12/14/12 2009;   methocarbamol (ROBAXIN) tablet 500 mg, 500 mg, Oral, Q6H PRN, Loanne Drilling, MD, 500 mg at 12/17/12 0845;  metoCLOPramide (REGLAN) injection 10 mg, 10 mg, Intravenous, Q8H PRN, Phillips Grout, MD metoCLOPramide (REGLAN) injection 10 mg, 10 mg, Intravenous, Q6H, Alexzandrew Perkins, PA-C, 10 mg at 12/17/12 0037;  metoCLOPramide (REGLAN) injection 5-10 mg, 5-10 mg, Intravenous, Q8H PRN, Loanne Drilling, MD;  metoCLOPramide (REGLAN) tablet 5-10 mg, 5-10 mg, Oral, Q8H PRN, Loanne Drilling, MD;  morphine 2 MG/ML injection 1-2 mg, 1-2 mg, Intravenous, Q1H PRN, Loanne Drilling, MD, 2 mg at 12/15/12 1606 naloxone (NARCAN) 2 mg in dextrose 5 % 250 mL infusion, 1-4 mcg/kg/hr, Intravenous, Continuous PRN, Phillips Grout, MD;  naloxone [facility]) injection 0.4 mg, 0.4 mg, Intravenous, PRN, Phillips Grout, MD;  ondansetron [facility]) injection 4 mg, 4 mg, Intravenous, Q8H PRN, Phillips Grout, MD;  ondansetron [facility]) injection 4 mg, 4 mg, Intravenous, Q6H PRN, Loanne Drilling, MD, 4 mg at 12/16/12 0444 ondansetron (ZOFRAN) tablet 4 mg, 4 mg, Oral, Q6H PRN, Loanne Drilling, MD;  oxyCODONE (Oxy IR/ROXICODONE) immediate release tablet 5-15 mg, 5-15 mg, Oral, Q3H PRN, Alexzandrew Perkins, PA-C, 10 mg at 12/17/12 0845;  phenol (CHLORASEPTIC) mouth spray 1 spray, 1 spray, Mouth/Throat, PRN, Loanne Drilling, MD;  polyethylene glycol (MIRALAX / GLYCOLAX) packet 17 g, 17 g, Oral, Daily, Alexzandrew Perkins, PA-C, 17 g at 12/16/12 1037 sodium chloride 0.9 % injection 3 mL, 3 mL, Intravenous, PRN, Phillips Grout, MD;  sodium phosphate (FLEET) 7-19 GM/118ML enema 1 enema, 1 enema, Rectal, Daily PRN, Loanne Drilling, MD;  traMADol Janean Sark) tablet 50-100 mg, 50-100 mg, Oral, Q6H PRN, Loanne Drilling, MD;  warfarin (COUMADIN) tablet 5 mg, 5 mg, Oral, ONCE-1800, Randall K Absher, RPH;  Warfarin - Pharmacist Dosing Inpatient, , Does not apply, q1800, Loanne Drilling, MD  Patients Current Diet: full liquid diet 12/21/12. To begin cardiac diet  on 6/25  Precautions / Restrictions Precautions Precautions: Knee Precaution Comments: maintained KI on B knees due to pts inability to demonstrate quad control Restrictions Weight Bearing Restrictions: No Other Position/Activity Restrictions: WBAT bilaterally   Prior Activity Level Community (5-7x/wk): Went out 3-4 X a week.  Worked PT in [employer] until 09/13.  Home Assistive Devices / Equipment Home Assistive Devices/Equipment: Cane (specify quad or straight);Walker (specify type);CPAP;Dentures (specify type) Home Adaptive Equipment: Straight cane  Prior Functional Level Prior Function Level of Independence: Independent Able to Take Stairs?: Yes Driving: No Patient is the caregiver for his wife who is w/c level and uses hoyer lift to get up to w/c daily. Patient had paid assistance a couple hrs per day to help him paid privately.  Current Functional Level Cognition  Arousal/Alertness: Awake/alert Overall Cognitive Status: Within Functional Limits for tasks assessed  Orientation Level: Oriented X4    Extremity Assessment (includes Sensation/Coordination)  RUE ROM/Strength/Tone: Within functional levels  RLE ROM/Strength/Tone: Deficits RLE ROM/Strength/Tone Deficits: ankle motion WFL, unable to perform SLR without assist.  RLE Sensation: WFL - Light Touch    ADLs  Grooming: Set up Where Assessed - Grooming: Supported sitting Upper Body Bathing: Supervision/safety (guarding assist with lines) Where Assessed - Upper Body Bathing: Supported sitting Lower Body Bathing: +2 Total assistance Lower Body Bathing: Patient Percentage: 20% Where Assessed - Lower Body Bathing: Supported sit to stand Upper Body Dressing: Minimal assistance Where Assessed - Upper Body Dressing: Unsupported sitting Lower Body Dressing: +2 Total assistance Lower Body Dressing: Patient Percentage: 0% Where Assessed - Lower Body Dressing: Supported sit to stand Toilet Transfer: Simulated;+2 Total  assistance Toilet Transfer: Patient Percentage:  (10% to stand, 60% for steps) Toilet Transfer Method: Surveyor, minerals:  (chair to bed) Toileting - Architect and Hygiene: +2 Total assistance Toileting - Clothing Manipulation and Hygiene: Patient Percentage: 10% Equipment Used: Rolling walker Transfers/Ambulation Related to ADLs: Pt performed spt to bed ADL Comments: educated on AE but did not demonstrate today.  Pt uncomfortable and feeling nauseaus.  Will continue education.      Mobility  Bed Mobility: Sit to Supine Supine to Sit: 1: +2 Total assist;With rails;HOB elevated Supine to Sit: Patient Percentage: 50% Sit to Supine: 1: +2 Total assist Sit to Supine: Patient Percentage: 50%    Transfers  Transfers: Sit to Stand;Stand to Sit Sit to Stand: 1: +2 Total assist;From chair/3-in-1;From elevated surface Sit to Stand: Patient Percentage: 30% Stand to Sit: 1: +2 Total assist;With upper extremity assist;With armrests;To bed Stand to Sit: Patient Percentage: 20% Stand Pivot Transfers: 1: +2 Total assist Stand Pivot Transfers: Patient Percentage: 40%    Ambulation / Gait / Stairs / Wheelchair Mobility  Ambulation/Gait Ambulation/Gait Assistance: 1: +2 Total assist Ambulation Distance (Feet): 26 Feet Assistive device: Rolling walker Ambulation/Gait Assistance Details: amb from recliner to bed Gait Pattern: Step-to pattern;Wide base of support;Decreased step length - right;Decreased step length - left;Shuffle Gait velocity: decreased Stairs: No Wheelchair Mobility Wheelchair Mobility: No    Posture / Balance      Special needs/care consideration BiPAP/CPAP Yes, CPAP at home for sleep apnea CPM Yes, for B TKR Continuous Drip IV No Dialysis No         Life Vest No Oxygen No Special Bed No Trach Size No Wound Vac (area) No       Skin Has B TKR incisions                               Bowel mgmt: BM 6/23 before and after enema Bladder mgmt:  Voiding WDL Diabetic mgmt No   Previous Home Environment Living Arrangements: Children;Spouse/significant other Lives With: Spouse Available Help at Discharge: Family Type of Home: House Home Care Services: No Additional Comments: Pt planning on going to rehab following hospital stay Patient has son in [place]  And dtr in [place]  Discharge Living Setting Plans for Discharge Living Setting: Patient's home;House;Lives with (comment) (Lives with disabled wife.) Type of Home at Discharge: House Discharge Home Layout: Multi-level;Able to live on main level with bedroom/bathroom Alternate Level Stairs-Number of Steps: 5 steps donw to family room in house Discharge Home Access: Stairs to enter Entrance Stairs-Number of Steps: 5 step entry Do you have any problems obtaining your medications?: No  Social/Family/Support Systems Patient Roles: Spouse;Parent (  Has 69 yo son and 48 yo daughter.) Contact Information: [place] - wife Anticipated Caregiver: patient Anticipated Industrial/product designer Information: [place] - wife [phone] Ability/Limitations of Caregiver: Wife disabled and cannot provide any hands on care.  Children work. Caregiver Availability: Other (Comment) (Patient will need to be as independent as possible.) Discharge Plan Discussed with Primary Caregiver: Yes (Spoke with patient and his wife.) Is Caregiver In Agreement with Plan?: Yes (Patient and wife in agreement to rehab.) Does Caregiver/Family have Issues with Lodging/Transportation while Pt is in Rehab?: Yes (Wife is wheelchair bound.)  Goals/Additional Needs Patient/Family Goal for Rehab: PT/OT mod I goals, no ST needs Expected length of stay: 7-10 days Cultural Considerations: [employer]  Equipment Needs: TBD Special Service Needs: Wife expressed concern that patient will need help with meals when he comes home.  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 Additional Information: Wife is disabled, not  working.  Wife manages from a motorized wheel chair.  Neighbors are providing wife with meals Pt/Family Agrees to Admission and willing to participate: Yes Program Orientation Provided & Reviewed with Pt/Caregiver Including Roles  & Responsibilities: Yes   Decrease burden of Care through IP rehab admission: Not applicable  Possible need for SNF placement upon discharge: Not anticipated  Patient Condition: This patient's medical and functional status has changed since the consult dated: 12/14/12 in which the Rehabilitation Physician determined and documented that the patient's condition is appropriate for intensive rehabilitative care in an inpatient rehabilitation facility. See "History of Present Illness" (above) for medical update. Functional changes [id number] mod assist transfer and then min assist short distances.Patient's medical and functional status update has been discussed with the Rehabilitation physician and patient remains appropriate for inpatient rehabilitation. Will admit to inpatient rehab today.  Preadmission Screen Completed By:  Ottie Glazier, RN at 12/22/12 at 1003. ______________________________________________________________________   Discussed status with Dr. Riley Kill on 12/22/12 at  1004 and received telephone approval for admission today.  Admission Coordinator: Ottie Glazier, RN, time 1004 Date  12/22/12.

## 2012-12-17 ENCOUNTER — Inpatient Hospital Stay (HOSPITAL_COMMUNITY): Payer: BC Managed Care – PPO

## 2012-12-17 LAB — CBC
HCT: 26 % — ABNORMAL LOW (ref 39.0–52.0)
Hemoglobin: 8.7 g/dL — ABNORMAL LOW (ref 13.0–17.0)
MCH: 28.2 pg (ref 26.0–34.0)
MCHC: 33.5 g/dL (ref 30.0–36.0)

## 2012-12-17 LAB — BASIC METABOLIC PANEL
BUN: 16 mg/dL (ref 6–23)
Chloride: 102 mEq/L (ref 96–112)
Glucose, Bld: 96 mg/dL (ref 70–99)
Potassium: 4 mEq/L (ref 3.5–5.1)

## 2012-12-17 MED ORDER — BLISTEX EX OINT
TOPICAL_OINTMENT | CUTANEOUS | Status: AC
  Administered 2012-12-17: 1
  Filled 2012-12-17: qty 10

## 2012-12-17 MED ORDER — WARFARIN SODIUM 5 MG PO TABS
5.0000 mg | ORAL_TABLET | Freq: Once | ORAL | Status: AC
  Administered 2012-12-17: 5 mg via ORAL
  Filled 2012-12-17: qty 1

## 2012-12-17 MED ORDER — BISACODYL 10 MG RE SUPP
10.0000 mg | Freq: Once | RECTAL | Status: AC
  Administered 2012-12-17: 10 mg via RECTAL
  Filled 2012-12-17: qty 1

## 2012-12-17 NOTE — Progress Notes (Signed)
Rehab admissions - Noted patient with ileus and likely not ready for inpatient rehab until Monday.  My partner, Britta Mccreedy, will follow up on Monday.  I have been in touch with BCBS case manager regarding possible acute inpatient rehab admission potential for Monday.  Call for questions.  #[phone]

## 2012-12-17 NOTE — Progress Notes (Signed)
ANTICOAGULATION CONSULT NOTE - Follow up  Pharmacy Consult for warfarin Indication: VTE prophylaxis  No Known Allergies  Patient Measurements: Height: 6\' 1"  (185.4 cm) Weight: 250 lb (113.399 kg) IBW/kg (Calculated) : 79.9   Vital Signs: Temp: 98.9 F (37.2 C) (06/20 0534) BP: 136/80 mmHg (06/20 0534) Pulse Rate: 105 (06/20 0534)  Labs:  Recent Labs  12/15/12 0450 12/16/12 0550 12/17/12 0508  HGB 11.0* 9.5* 8.7*  HCT 34.2* 28.9* 26.0*  PLT 202 191 218  LABPROT 16.1* 14.4 14.2  INR 1.32 1.14 1.11  CREATININE 0.93 0.90 0.92    Estimated Creatinine Clearance: 109.9 ml/min (by C-G formula based on Cr of 0.92).   Medications:  Scheduled:  . atorvastatin  40 mg Oral Daily  . bisacodyl  10 mg Rectal Once  . docusate sodium  100 mg Oral BID  . enoxaparin (LOVENOX) injection  30 mg Subcutaneous BID  . latanoprost  1 drop Both Eyes QHS  . metoCLOPramide (REGLAN) injection  10 mg Intravenous Q6H  . polyethylene glycol  17 g Oral Daily  . Warfarin - Pharmacist Dosing Inpatient   Does not apply q1800   Assessment: 61yo M s/p bilat TKA 6/16, on continuous epidural infusion postop. Pharmacy was asked to start warfarin on 6/17.   Epidural was removed (without complication, tip intact) 6/18 at 3:15pm.  Lovenox 30mg  SQ BID was started 6/19 at 0645  Low dose warfarin (2.5mg ) given 6/17 due to epidural. Warfarin dose 6/18 was ordered but not given.  Warfarin 5mg  given 6/19.   No INR response yet.  Diet currently NPO secondary to post-operative ileus.  If oral warfarin is absorbed, dosage requirements could be lower than expected until patient is able to tolerate a regular diet and normal vitamin K intake.  Goal of Therapy:  INR 2-3   Plan:   Warfarin 5mg  PO today at 1800.  Continue prophylactic-dose Lovenox as per ortho.  Check PT/INR daily.   Elie Goody, PharmD, BCPS Pager: [phone] 12/17/2012  8:26 AM

## 2012-12-17 NOTE — Progress Notes (Signed)
Physical Therapy Treatment Patient Details Name: Kent Ryan MRN: [id number] DOB: 1951-01-29 Today's Date: 12/17/2012 Time: 4098-1191 PT Time Calculation (min): 43 min  PT Assessment / Plan / Recommendation Comments on Treatment Session  POD # 4 B TKR plus post op ileus.  Assisted pt OOB to [facility] for small BM.  Assisted with hygiene then amb in hallway. Positioned in recliner.  Pt progressing slowly.  More c/o fatigue due to his "bathroom issue" pt says.    Follow Up Recommendations  CIR     Does the patient have the potential to tolerate intense rehabilitation     Barriers to Discharge        Equipment Recommendations  Rolling walker with 5" wheels    Recommendations for Other Services    Frequency 7X/week   Plan Discharge plan remains appropriate    Precautions / Restrictions Precautions Precautions: Knee Precaution Comments: maintained KI on B knees due to pts inability to demonstrate quad control Required Braces or Orthoses: Knee Immobilizer - Right;Knee Immobilizer - Left Knee Immobilizer - Right: Discontinue once straight leg raise with < 10 degree lag Knee Immobilizer - Left: Discontinue once straight leg raise with < 10 degree lag Restrictions Other Position/Activity Restrictions: WBAT bilaterally   Pertinent Vitals/Pain C/o 4/10 B knee pain    Mobility  Bed Mobility Bed Mobility: Supine to Sit Supine to Sit: 1: +2 Total assist;With rails;HOB elevated Supine to Sit: Patient Percentage: 60% Details for Bed Mobility Assistance: Max assist to support b LE's off bed and extra assist with upper body due to distended stomach Transfers Transfers: Sit to Stand;Stand to Sit Sit to Stand: 1: +2 Total assist;From elevated surface;From toilet;From bed Sit to Stand: Patient Percentage: 40% Stand to Sit: 1: +2 Total assist;With upper extremity assist;With armrests;To chair/3-in-1;To toilet Details for Transfer Assistance: MAX assist to stand due to B KI's and MAX assist to  support B LE's as pt sat Ambulation/Gait Ambulation/Gait Assistance: 1: +2 Total assist Ambulation Distance (Feet): 38 Feet Assistive device: Rolling walker Ambulation/Gait Assistance Details: increased time Gait Pattern: Step-to pattern;Wide base of support;Decreased step length - right;Decreased step length - left;Shuffle Gait velocity: decreased     PT Goals                                                    progressing    Visit Information  Last PT Received On: 12/17/12 Assistance Needed: +2    Subjective Data  Subjective: My stomach is still tight   Cognition       Balance     End of Session PT - End of Session Equipment Utilized During Treatment: Gait belt;Right knee immobilizer;Left knee immobilizer Activity Tolerance: Patient tolerated treatment well Patient left: in chair;with call bell/phone within reach;with family/visitor present   Felecia Shelling  PTA [facility]  Acute  Rehab Pager      [phone]

## 2012-12-17 NOTE — Progress Notes (Signed)
Subjective: 4 Days Post-Op Procedure(s) (LRB): TOTAL KNEE BILATERAL (Bilateral) Patient reports pain as mild and moderate.   Patient seen in rounds for Dr. Lequita Halt.  He states that he had a small movement last night with the enema and felt better temporarily.  His belly is tight again this morning.  He had some nausea last night also.  No nausea at this time.  Supp this am to help decompress and the enema again if needed.  Repeat KUB is pending at this time.  If developed nausea/vomiting, then NGT and GI consult.  If improves this morning, encourage mobility and repeat KUB in the AM. Plan is for CIR which will likely be Monday at this point. Patient is having problems with constipation and pain in the knees, requiring pain medications Epidural to come out today.  Anticipate possible increase in pain temporarily after the epidural has been removed. Plan is to go Rehab after hospital stay.  Objective: Vital signs in last 24 hours: Temp:  [98.3 F (36.8 C)-98.9 F (37.2 C)] 98.9 F (37.2 C) (06/20 0534) Pulse Rate:  [100-114] 105 (06/20 0534) Resp:  [16-20] 18 (06/20 0534) BP: (136-160)/(73-88) 136/80 mmHg (06/20 0534) SpO2:  [91 %-100 %] 100 % (06/20 0534)  Intake/Output from previous day:  Intake/Output Summary (Last 24 hours) at 12/17/12 0725 Last data filed at 12/17/12 0600  Gross per 24 hour  Intake 2434.17 ml  Output   2125 ml  Net 309.17 ml    Intake/Output this shift:    Labs:  Recent Labs  12/15/12 0450 12/16/12 0550 12/17/12 0508  HGB 11.0* 9.5* 8.7*    Recent Labs  12/15/12 0450 12/16/12 0550 12/17/12 0508  WBC 18.1* 15.4* 10.2  RBC 4.07* 3.46* 3.09*  HCT 34.2* 28.9* 26.0*  PLT 202 191 218    Recent Labs  12/15/12 0450 12/16/12 0550 12/17/12 0508  NA 135 135 133*  K 4.4 4.5 4.0  CL 102 102 102  BUN 11 14 16   CREATININE 0.93 0.90 0.92  GLUCOSE 142* 125* 96  CALCIUM 10.4 10.1 9.8    Recent Labs  12/15/12 0450 12/16/12 0550 12/17/12 0508   INR 1.32 1.14 1.11    EXAM General - Patient is Alert, Appropriate and Oriented Extremity - Neurovascular intact Sensation intact distally Dorsiflexion/Plantar flexion intact No cellulitis present Dressing/Incision - clean, dry, no drainage, healing Motor Function - intact, moving feet and toes well on exam.  Abdomen - distended, tympanic, tender to palpation in the left lower quad   Past Medical History  Diagnosis Date  . Hypertension   . Sleep apnea     cpap - does not know settings   . Arthritis     Assessment/Plan: 4 Days Post-Op Procedure(s) (LRB): TOTAL KNEE BILATERAL (Bilateral) Principal Problem:   OA (osteoarthritis) of knee Active Problems:   Ileus, postoperative   Postoperative anemia due to acute blood loss   Hyponatremia  Estimated body mass index is 32.99 kg/(m^2) as calculated from the following:   Height as of this encounter: 6\' 1"  (1.854 m).   Weight as of this encounter: 113.399 kg (250 lb). Up with therapy Suppository, then Enema Repeat KUB today and then again tomorrow  Plan for CIR, probably Monday if improves over weekend. DVT Prophylaxis - Lovenox and Coumadin  Weight-Bearing as tolerated to both legs  Take Coumadin for four weeks and then discontinue.  The dose may need to be adjusted based upon the INR.  Please follow the INR and titrate Coumadin  dose for a therapeutic range between 2.0 and 3.0 INR.  After completing the four weeks of Coumadin, the patient may stop the Coumadin and resume their 81 mg Aspirin daily.  Lovenox injections will start tomorrow evening after the epidural has been removed and continue until the INR is therapeutic at or greater than 2.0.  When INR reaches the therapeutic level of equal to or greater than 2.0, the patient may discontinue the Lovenox injections.  Aayush Gelpi 12/17/2012, 7:25 AM

## 2012-12-17 NOTE — Progress Notes (Signed)
12/17/2012 Colleen Can BSN RN CCM [phone] Conc review completed. Pt postive for ileus. -NPO, iv flds 100cc/hr, NPO. Plans for CIR when medically stable if approved by insurance. 2nd choice SNF.

## 2012-12-17 NOTE — Progress Notes (Signed)
Physical Therapy Treatment Patient Details Name: Kent Ryan MRN: [id number] DOB: 1951-05-11 Today's Date: 12/17/2012 Time: 7829-5621 PT Time Calculation (min): 46 min  PT Assessment / Plan / Recommendation Comments on Treatment Session  POD # 4 B TKR w/ post op ileus.  Assisted pt out of recliner + 2 assist 2nd B KI's to amb in hallway then assisted to [facility].  Pt still having BM issues and ABD distaention.  Assisted back to bed to perform TE's B LE's.  Applied ICE.    Follow Up Recommendations  CIR     Does the patient have the potential to tolerate intense rehabilitation     Barriers to Discharge        Equipment Recommendations  Rolling walker with 5" wheels    Recommendations for Other Services    Frequency 7X/week   Plan Discharge plan remains appropriate    Precautions / Restrictions Precautions Precautions: Knee Precaution Comments: maintained KI on B knees due to pts inability to demonstrate quad control Required Braces or Orthoses: Knee Immobilizer - Right;Knee Immobilizer - Left Knee Immobilizer - Right: Discontinue once straight leg raise with < 10 degree lag Knee Immobilizer - Left: Discontinue once straight leg raise with < 10 degree lag Restrictions Other Position/Activity Restrictions: WBAT bilaterally   Pertinent Vitals/Pain C/o 7/10 B knee pain with TE's ICE applied    Mobility  Bed Mobility Bed Mobility: Sit to Supine Supine to Sit: 1: +2 Total assist;With rails;HOB elevated Supine to Sit: Patient Percentage: 60% Sit to Supine: 1: +2 Total assist Sit to Supine: Patient Percentage: 60% Details for Bed Mobility Assistance: MAX assist to support B LE's up onto bed Transfers Transfers: Sit to Stand;Stand to Sit Sit to Stand: 1: +2 Total assist;From elevated surface;From toilet;From chair/3-in-1 Sit to Stand: Patient Percentage: 40% Stand to Sit: 1: +2 Total assist;With upper extremity assist;With armrests;To toilet;To bed Details for Transfer  Assistance: MAX assist to stand due to B KI's and MAX assist to support B LE's as pt sat Ambulation/Gait Ambulation/Gait Assistance: 1: +2 Total assist Ambulation Distance (Feet): 82 Feet Assistive device: Rolling walker Ambulation/Gait Assistance Details: increased time Gait Pattern: Step-to pattern;Wide base of support;Decreased step length - right;Decreased step length - left;Shuffle Gait velocity: decreased   Total Knee Replacement TE's 10 reps B LE ankle pumps 10 reps knee presses 10 reps heel slides  10 reps SAQ's 10 reps SLR's 10 reps ABD Followed by ICE   PT Goals                                                      progressing    Visit Information  Last PT Received On: 12/17/12 Assistance Needed: +2    Subjective Data  Subjective: My stomach is still tight   Cognition       Balance   fair  End of Session PT - End of Session Equipment Utilized During Treatment: Gait belt;Right knee immobilizer;Left knee immobilizer Activity Tolerance: Patient tolerated treatment well Patient left: in bed;with call bell/phone within reach   Felecia Shelling  PTA [facility]  Acute  Rehab Pager      [phone]

## 2012-12-17 NOTE — Progress Notes (Signed)
CSW available to assist with d/c planning as needed. Pt hoping to have CIR placement when stable for d/c. CSW will continue to follow until d/c plans have been finalized.  Cori Razor LCSW [phone]

## 2012-12-18 LAB — CBC
HCT: 24.4 % — ABNORMAL LOW (ref 39.0–52.0)
Hemoglobin: 8 g/dL — ABNORMAL LOW (ref 13.0–17.0)
MCHC: 32.8 g/dL (ref 30.0–36.0)
MCV: 84.1 fL (ref 78.0–100.0)
RDW: 13.1 % (ref 11.5–15.5)

## 2012-12-18 LAB — BASIC METABOLIC PANEL
BUN: 14 mg/dL (ref 6–23)
CO2: 26 mEq/L (ref 19–32)
Chloride: 102 mEq/L (ref 96–112)
Creatinine, Ser: 0.85 mg/dL (ref 0.50–1.35)
GFR calc Af Amer: >90 mL/min (ref >90)
Potassium: 3.7 mEq/L (ref 3.5–5.1)

## 2012-12-18 MED ORDER — WARFARIN SODIUM 5 MG PO TABS
5.0000 mg | ORAL_TABLET | Freq: Once | ORAL | Status: AC
  Administered 2012-12-18: 5 mg via ORAL
  Filled 2012-12-18: qty 1

## 2012-12-18 NOTE — Progress Notes (Signed)
Subjective: 5 Days Post-Op Procedure(s) (LRB): TOTAL KNEE BILATERAL (Bilateral) Patient reports pain as mild.  Knee pain well controlled. Post-op ileus with continued abdominal pressure although does report flatus. Seen by myself and Dr. Shelle Iron this AM.  Objective: Vital signs in last 24 hours: Temp:  [99 F (37.2 C)] 99 F (37.2 C) (06/21 0629) Pulse Rate:  [100-108] 100 (06/21 0629) Resp:  [16] 16 (06/21 0629) BP: (130-132)/(73-74) 130/74 mmHg (06/21 0629) SpO2:  [94 %-95 %] 94 % (06/21 0629)  Intake/Output from previous day: 06/20 0701 - 06/21 0700 In: 1273.3 [P.O.:560; I.V.:713.3] Out: 900 [Urine:900] Intake/Output this shift: Total I/O In: 546.7 [I.V.:546.7] Out: 400 [Urine:400]   Recent Labs  12/16/12 0550 12/17/12 0508 12/18/12 0514  HGB 9.5* 8.7* 8.0*    Recent Labs  12/17/12 0508 12/18/12 0514  WBC 10.2 8.9  RBC 3.09* 2.90*  HCT 26.0* 24.4*  PLT 218 217    Recent Labs  12/17/12 0508 12/18/12 0514  NA 133* 134*  K 4.0 3.7  CL 102 102  CO2 25 26  BUN 16 14  CREATININE 0.92 0.85  GLUCOSE 96 102*  CALCIUM 9.8 9.6    Recent Labs  12/17/12 0508 12/18/12 0514  INR 1.11 1.29    Neurologically intact ABD soft Neurovascular intact Sensation intact distally Intact pulses distally Dorsiflexion/Plantar flexion intact Incision: dressing C/D/I and no drainage No cellulitis present Compartment soft No calf pain or sign of DVT  Assessment/Plan: 5 Days Post-Op Procedure(s) (LRB): TOTAL KNEE BILATERAL (Bilateral) Advance diet Up with therapy Plan D/C Monday SNF Bowel protocol  Mry Lamia M. 12/18/2012, 8:11 AM

## 2012-12-18 NOTE — Progress Notes (Signed)
ANTICOAGULATION CONSULT NOTE - Follow up  Pharmacy Consult for warfarin Indication: VTE prophylaxis  No Known Allergies  Patient Measurements: Height: 6\' 1"  (185.4 cm) Weight: 250 lb (113.399 kg) IBW/kg (Calculated) : 79.9   Vital Signs: Temp: 99 F (37.2 C) (06/21 0629) Temp src: Oral (06/21 0629) BP: 130/74 mmHg (06/21 0629) Pulse Rate: 100 (06/21 0629)  Labs:  Recent Labs  12/16/12 0550 12/17/12 0508 12/18/12 0514  HGB 9.5* 8.7* 8.0*  HCT 28.9* 26.0* 24.4*  PLT 191 218 217  LABPROT 14.4 14.2 15.8*  INR 1.14 1.11 1.29  CREATININE 0.90 0.92 0.85    Estimated Creatinine Clearance: 118.9 ml/min (by C-G formula based on Cr of 0.85).   Medications:  Scheduled:  . atorvastatin  40 mg Oral Daily  . docusate sodium  100 mg Oral BID  . enoxaparin (LOVENOX) injection  30 mg Subcutaneous BID  . latanoprost  1 drop Both Eyes QHS  . polyethylene glycol  17 g Oral Daily  . Warfarin - Pharmacist Dosing Inpatient   Does not apply q1800   Assessment: 62yo M s/p bilat TKA 6/16, on continuous epidural infusion postop. Pharmacy was asked to start warfarin on 6/17.   Epidural was removed (without complication, tip intact) 6/18 at 3:15pm.  Lovenox 30mg  SQ BID was started 6/19 at 0645  Low dose warfarin (2.5mg ) given 6/17 due to epidural. Warfarin dose 6/18 was ordered but not given.  Warfarin 5mg  given 6/19 and 6/20, INR = 1.39 showing signs of trending up  Diet advanced 6/20 but does still c/o abd discomfort secondary to post-operative ileus.  If oral warfarin is absorbed, dosage requirements could be lower than expected until patient is able to tolerate a regular diet and normal vitamin K intake.  + BM documented 6/20  Goal of Therapy:  INR 2-3   Plan:   Repeat Warfarin 5mg  PO today at 1800 as INR showing signs of trending up  Continue prophylactic-dose Lovenox as per ortho.  Check PT/INR daily.   Juliette Alcide, PharmD, BCPS.   Pager: [phone] 12/18/2012  8:16  AM

## 2012-12-18 NOTE — Progress Notes (Signed)
Occupational Therapy Treatment Patient Details Name: Kent Ryan MRN: [id number] DOB: Sep 22, 1950 Today's Date: 12/18/2012 Time: 1478-2956 OT Time Calculation (min): 42 min  OT Assessment / Plan / Recommendation Comments on Treatment Session  Pt with improvement this OT visit    Follow Up Recommendations  CIR             Frequency Min 2X/week   Plan Discharge plan remains appropriate    Precautions / Restrictions Precautions Precautions: Knee Precaution Comments: maintained KI on B knees due to pts inability to demonstrate quad control Required Braces or Orthoses: Knee Immobilizer - Right;Knee Immobilizer - Left Knee Immobilizer - Right: Discontinue once straight leg raise with < 10 degree lag Knee Immobilizer - Left: Discontinue once straight leg raise with < 10 degree lag Restrictions Weight Bearing Restrictions: No Other Position/Activity Restrictions: WBAT bilaterally       ADL  Toilet Transfer: +1 Total assistance Toilet Transfer: Patient Percentage: 50% Toilet Transfer Method: Sit to stand;Stand pivot Acupuncturist: Extra wide bedside commode Toileting - Clothing Manipulation and Hygiene: Simulated;+2 Total assistance Toileting - Architect and Hygiene: Patient Percentage: 50% Where Assessed - Toileting Clothing Manipulation and Hygiene: Standing;Sit to stand from 3-in-1 or toilet ADL Comments: see PT note also        Visit Information  Last OT Received On: 12/18/12 Assistance Needed: +2 PT/OT Co-Evaluation/Treatment: Yes    Subjective Data  Subjective: I do want to try to sit on the [facility]      Cognition  Cognition Arousal/Alertness: Awake/alert Behavior During Therapy: WFL for tasks assessed/performed Overall Cognitive Status: Within Functional Limits for tasks assessed    Mobility  Bed Mobility Bed Mobility: Supine to Sit Supine to Sit: 1: +2 Total assist;With rails;HOB elevated Supine to Sit: Patient Percentage: 60% Details  for Bed Mobility Assistance: Continues to require assist for BLEs out of bed, however is doing somewhat better with hand placement with cues to self assist.  Transfers Sit to Stand: 1: +2 Total assist;From elevated surface;From toilet;From chair/3-in-1;From bed Sit to Stand: Patient Percentage: 50% Stand to Sit: 1: +2 Total assist;With upper extremity assist;With armrests;To toilet;To bed Stand to Sit: Patient Percentage: 40% Details for Transfer Assistance: Did somewhat better with LLE knee immobilizer off, therefore could allow more knee flex to self assist.  Continues to require increased assist for LEs when sitting.           End of Session OT - End of Session Equipment Utilized During Treatment: Gait belt Activity Tolerance: Patient tolerated treatment well Patient left: in chair;with call bell/phone within reach;with nursing in room  GO     Kent Ryan, Kent Ryan 12/18/2012, 10:52 AM

## 2012-12-18 NOTE — Progress Notes (Signed)
Physical Therapy Treatment Patient Details Name: Generoso Cropper MRN: [id number] DOB: [birth year] Today's Date: 12/18/2012 Time: 4098-1191 PT Time Calculation (min): 26 min  PT Assessment / Plan / Recommendation Comments on Treatment Session  Pt making slow progress due to increased pain in abdomen.      Follow Up Recommendations  CIR     Does the patient have the potential to tolerate intense rehabilitation     Barriers to Discharge        Equipment Recommendations  Rolling walker with 5" wheels    Recommendations for Other Services    Frequency 7X/week   Plan Discharge plan remains appropriate    Precautions / Restrictions Precautions Precautions: Knee Precaution Comments: performed session without KI on LLE.   Required Braces or Orthoses: Knee Immobilizer - Right;Knee Immobilizer - Left Knee Immobilizer - Right: Discontinue once straight leg raise with < 10 degree lag Restrictions Weight Bearing Restrictions: No Other Position/Activity Restrictions: WBAT bilaterally   Pertinent Vitals/Pain 8/10 pain, ice packs applied    Mobility  Bed Mobility Bed Mobility: Sit to Supine Sit to Supine: 1: +2 Total assist Sit to Supine: Patient Percentage: 50% Details for Bed Mobility Assistance: Assist for BLEs into bed with some assist for trunk as pt was with increased pain this afternoon.  Transfers Transfers: Sit to Stand;Stand to Sit Sit to Stand: 1: +2 Total assist;From elevated surface;From toilet;From chair/3-in-1;From bed Sit to Stand: Patient Percentage: 40% Stand to Sit: 1: +2 Total assist;With upper extremity assist;With armrests;To toilet;To bed Stand to Sit: Patient Percentage: 40% Stand Pivot Transfers: 1: +2 Total assist Stand Pivot Transfers: Patient Percentage: 50% Details for Transfer Assistance: Required increased assist to stand from 3in1 this afternoon with continued cues for hand placement and LE management.     Exercises Total Joint Exercises Ankle  Circles/Pumps: AROM;Both;20 reps Quad Sets: AROM;Both;10 reps Heel Slides: AAROM;Both;10 reps Straight Leg Raises: AAROM;Both;10 reps   PT Diagnosis:    PT Problem List:   PT Treatment Interventions:     PT Goals Acute Rehab PT Goals PT Goal Formulation: With patient Time For Goal Achievement: 12/21/12 Potential to Achieve Goals: Good Pt will go Sit to Supine/Side: with min assist PT Goal: Sit to Supine/Side - Progress: Progressing toward goal Pt will go Sit to Stand: with mod assist PT Goal: Sit to Stand - Progress: Progressing toward goal Pt will go Stand to Sit: with mod assist PT Goal: Stand to Sit - Progress: Progressing toward goal Pt will Perform Home Exercise Program: with supervision, verbal cues required/provided PT Goal: Perform Home Exercise Program - Progress: Progressing toward goal  Visit Information  Last PT Received On: 12/18/12 Assistance Needed: +2    Subjective Data  Subjective: If I could get this stomach think worked out, I'd be ok.  Patient Stated Goal: to go to rehab   Cognition  Cognition Arousal/Alertness: Awake/alert Behavior During Therapy: WFL for tasks assessed/performed Overall Cognitive Status: Within Functional Limits for tasks assessed    Balance     End of Session PT - End of Session Equipment Utilized During Treatment: Gait belt;Right knee immobilizer Activity Tolerance: Patient limited by fatigue;Patient tolerated treatment well Patient left: in chair;with call bell/phone within reach Nurse Communication: Mobility status   GP     Vista Deck 12/18/2012, 5:19 PM

## 2012-12-18 NOTE — Plan of Care (Signed)
Problem: Discharge Progression Outcomes Goal: Barriers To Progression Addressed/Resolved Outcome: Progressing ileus

## 2012-12-18 NOTE — Progress Notes (Signed)
Transferred to 1533 via bed. No changes in assessment. Leeana Creer, [employer]

## 2012-12-18 NOTE — Progress Notes (Signed)
Physical Therapy Treatment Patient Details Name: Kent Ryan MRN: [id number] DOB: [birth year] Today's Date: 12/18/2012 Time: 4098-1191 PT Time Calculation (min): 42 min  PT Assessment / Plan / Recommendation Comments on Treatment Session  Pt making progress with ambulation and mobility, however continues to have distended stomach.      Follow Up Recommendations  CIR     Does the patient have the potential to tolerate intense rehabilitation     Barriers to Discharge        Equipment Recommendations  Rolling walker with 5" wheels    Recommendations for Other Services    Frequency 7X/week   Plan Discharge plan remains appropriate    Precautions / Restrictions Precautions Precautions: Knee Precaution Comments: maintained KI on B knees due to pts inability to demonstrate quad control Required Braces or Orthoses: Knee Immobilizer - Right;Knee Immobilizer - Left Knee Immobilizer - Right: Discontinue once straight leg raise with < 10 degree lag Knee Immobilizer - Left: Discontinue once straight leg raise with < 10 degree lag Restrictions Weight Bearing Restrictions: No Other Position/Activity Restrictions: WBAT bilaterally   Pertinent Vitals/Pain 8/10    Mobility  Bed Mobility Bed Mobility: Supine to Sit Supine to Sit: 1: +2 Total assist;With rails;HOB elevated Supine to Sit: Patient Percentage: 60% Details for Bed Mobility Assistance: Continues to require assist for BLEs out of bed, however is doing somewhat better with hand placement with cues to self assist.  Transfers Transfers: Sit to Stand;Stand to Sit Sit to Stand: 1: +2 Total assist;From elevated surface;From toilet;From chair/3-in-1;From bed Sit to Stand: Patient Percentage: 50% Stand to Sit: 1: +2 Total assist;With upper extremity assist;With armrests;To toilet;To bed Stand to Sit: Patient Percentage: 40% Stand Pivot Transfers: 1: +2 Total assist Stand Pivot Transfers: Patient Percentage: 50% Details for  Transfer Assistance: Did somewhat better with LLE knee immobilizer off, therefore could allow more knee flex to self assist.  Continues to require increased assist for LEs when sitting.  Ambulation/Gait Ambulation/Gait Assistance: 1: +2 Total assist Ambulation/Gait: Patient Percentage: 60% Ambulation Distance (Feet): 70 Feet Assistive device: Rolling walker Ambulation/Gait Assistance Details: Cues for sequencing/technique with RW as pt tends to push RW too far ahead of him.  Gait Pattern: Step-to pattern;Wide base of support;Decreased step length - right;Decreased step length - left;Shuffle Gait velocity: decreased    Exercises     PT Diagnosis:    PT Problem List:   PT Treatment Interventions:     PT Goals Acute Rehab PT Goals PT Goal Formulation: With patient Time For Goal Achievement: 12/21/12 Potential to Achieve Goals: Good Pt will go Supine/Side to Sit: with min assist PT Goal: Supine/Side to Sit - Progress: Progressing toward goal Pt will go Sit to Stand: with mod assist PT Goal: Sit to Stand - Progress: Progressing toward goal Pt will go Stand to Sit: with mod assist PT Goal: Stand to Sit - Progress: Progressing toward goal Pt will Ambulate: 16 - 50 feet;with mod assist;with rolling walker PT Goal: Ambulate - Progress: Progressing toward goal  Visit Information  Last PT Received On: 12/18/12 Assistance Needed: +2    Subjective Data  Subjective: My stomach still hurts.  Patient Stated Goal: to go to rehab   Cognition  Cognition Arousal/Alertness: Awake/alert Behavior During Therapy: WFL for tasks assessed/performed Overall Cognitive Status: Within Functional Limits for tasks assessed    Balance     End of Session PT - End of Session Equipment Utilized During Treatment: Gait belt;Right knee immobilizer Activity Tolerance: Patient limited by fatigue;Patient  tolerated treatment well Patient left: in chair;with call bell/phone within reach Nurse Communication:  Mobility status   GP     Vista Deck 12/18/2012, 11:02 AM

## 2012-12-19 LAB — CBC WITH DIFFERENTIAL/PLATELET
Basophils Absolute: 0 10*3/uL (ref 0.0–0.1)
Basophils Relative: 0 % (ref 0–1)
Eosinophils Absolute: 0.3 10*3/uL (ref 0.0–0.7)
Eosinophils Relative: 3 % (ref 0–5)
HCT: 24.6 % — ABNORMAL LOW (ref 39.0–52.0)
Hemoglobin: 8.2 g/dL — ABNORMAL LOW (ref 13.0–17.0)
Lymphocytes Relative: 9 % — ABNORMAL LOW (ref 12–46)
Lymphs Abs: 0.8 10*3/uL (ref 0.7–4.0)
MCH: 28.1 pg (ref 26.0–34.0)
MCHC: 33.3 g/dL (ref 30.0–36.0)
MCV: 84.2 fL (ref 78.0–100.0)
Monocytes Absolute: 1 10*3/uL (ref 0.1–1.0)
Monocytes Relative: 11 % (ref 3–12)
Neutro Abs: 6.6 10*3/uL (ref 1.7–7.7)
Neutrophils Relative %: 76 % (ref 43–77)
Platelets: 252 10*3/uL (ref 150–400)
RBC: 2.92 MIL/uL — ABNORMAL LOW (ref 4.22–5.81)
RDW: 13.2 % (ref 11.5–15.5)
WBC: 8.7 10*3/uL (ref 4.0–10.5)

## 2012-12-19 LAB — PROTIME-INR: INR: 1.58 — ABNORMAL HIGH (ref 0.00–1.49)

## 2012-12-19 MED ORDER — WARFARIN SODIUM 5 MG PO TABS
5.0000 mg | ORAL_TABLET | Freq: Once | ORAL | Status: AC
  Administered 2012-12-19: 5 mg via ORAL
  Filled 2012-12-19 (×2): qty 1

## 2012-12-19 NOTE — Progress Notes (Addendum)
Physical Therapy Treatment Patient Details Name: Kent Ryan MRN: [id number] DOB: 1951-05-11 Today's Date: 12/19/2012 Time: 4098-1191 PT Time Calculation (min): 51 min  PT Assessment / Plan / Recommendation Comments on Treatment Session  Pt making marked improvement this afternoon as pt states his stomach is feeling much better.     Follow Up Recommendations  CIR     Does the patient have the potential to tolerate intense rehabilitation     Barriers to Discharge        Equipment Recommendations  Rolling walker with 5" wheels    Recommendations for Other Services    Frequency 7X/week   Plan Discharge plan remains appropriate    Precautions / Restrictions Precautions Precautions: Knee Precaution Comments: performed session without KI on LLE.   Required Braces or Orthoses: Knee Immobilizer - Right;Knee Immobilizer - Left Knee Immobilizer - Right: Discontinue once straight leg raise with < 10 degree lag Restrictions Weight Bearing Restrictions: No Other Position/Activity Restrictions: WBAT bilaterally   Pertinent Vitals/Pain 6/10, ice packs applied    Mobility  Bed Mobility Bed Mobility: Sit to Supine Sit to Supine: 3: Mod assist;HOB flat Details for Bed Mobility Assistance: Assist for BLEs into bed with min cues for adjusting hips once in bed.  Pt doing much better today with self assisting back into bed.  Transfers Transfers: Sit to Stand;Stand to Sit;Stand Pivot Transfers Sit to Stand: 1: +2 Total assist;From elevated surface;From chair/3-in-1 Sit to Stand: Patient Percentage: 50% Stand to Sit: 1: +2 Total assist;With upper extremity assist;With armrests;To bed;To chair/3-in-1 Stand to Sit: Patient Percentage: 50% Details for Transfer Assistance: Pt with marked improvement on bringing LEs under him as he stands this afternoon.   Ambulation/Gait Ambulation/Gait Assistance: 1: +2 Total assist Ambulation/Gait: Patient Percentage: 70% Ambulation Distance (Feet): 85  Feet Assistive device: Rolling walker Ambulation/Gait Assistance Details: Continue to provide cues for more upright posture, equal step lengths and keeping RW closer to him with forward progression.  Gait Pattern: Step-to pattern;Wide base of support;Decreased step length - right;Decreased step length - left;Shuffle Gait velocity: decreased    Exercises Total Joint Exercises Ankle Circles/Pumps: AROM;Both;20 reps Quad Sets: AROM;Both;10 reps Heel Slides: AAROM;Both;10 reps Straight Leg Raises: AAROM;Both;10 reps  AAROM knee flex: L is approx 55 deg, R is approx 45 deg   PT Diagnosis:    PT Problem List:   PT Treatment Interventions:     PT Goals Acute Rehab PT Goals PT Goal Formulation: With patient Time For Goal Achievement: 12/21/12 Potential to Achieve Goals: Good Pt will go Sit to Supine/Side: with min assist PT Goal: Sit to Supine/Side - Progress: Progressing toward goal Pt will go Sit to Stand: with mod assist PT Goal: Sit to Stand - Progress: Progressing toward goal Pt will go Stand to Sit: with mod assist PT Goal: Stand to Sit - Progress: Progressing toward goal Pt will Ambulate: 16 - 50 feet;with mod assist;with rolling walker PT Goal: Ambulate - Progress: Progressing toward goal Pt will Perform Home Exercise Program: with supervision, verbal cues required/provided PT Goal: Perform Home Exercise Program - Progress: Progressing toward goal  Visit Information  Last PT Received On: 12/19/12 Assistance Needed: +2    Subjective Data  Subjective: I'm feeling much better.  Patient Stated Goal: to go to rehab   Cognition  Cognition Arousal/Alertness: Awake/alert Behavior During Therapy: WFL for tasks assessed/performed Overall Cognitive Status: Within Functional Limits for tasks assessed    Balance     End of Session PT - End of Session  Equipment Utilized During Treatment: Gait belt;Right knee immobilizer Activity Tolerance: Patient tolerated treatment  well Patient left: in bed;with call bell/phone within reach;with family/visitor present Nurse Communication: Mobility status   GP     Vista Deck 12/19/2012, 4:08 PM

## 2012-12-19 NOTE — Progress Notes (Signed)
ANTICOAGULATION CONSULT NOTE - Follow up  Pharmacy Consult for warfarin Indication: VTE prophylaxis  No Known Allergies  Patient Measurements: Height: 6\' 1"  (185.4 cm) Weight: 250 lb (113.399 kg) IBW/kg (Calculated) : 79.9  Vital Signs: Temp: 97.5 F (36.4 C) (06/22 0500) Temp src: Oral (06/22 0500) BP: 148/79 mmHg (06/22 0500) Pulse Rate: 101 (06/22 0500)  Labs:  Recent Labs  12/17/12 0508 12/18/12 0514 12/19/12 0431 12/19/12 0857  HGB 8.7* 8.0*  --  8.2*  HCT 26.0* 24.4*  --  24.6*  PLT 218 217  --  252  LABPROT 14.2 15.8* 18.4*  --   INR 1.11 1.29 1.58*  --   CREATININE 0.92 0.85  --   --     Estimated Creatinine Clearance: 118.9 ml/min (by C-G formula based on Cr of 0.85).   Medications:  Scheduled:  . atorvastatin  40 mg Oral Daily  . docusate sodium  100 mg Oral BID  . enoxaparin (LOVENOX) injection  30 mg Subcutaneous BID  . latanoprost  1 drop Both Eyes QHS  . polyethylene glycol  17 g Oral Daily  . Warfarin - Pharmacist Dosing Inpatient   Does not apply q1800   Assessment: 62yo M s/p bilat TKA 6/16, on continuous epidural infusion postop. Pharmacy was asked to start warfarin on 6/17.   Epidural was removed (without complication, tip intact) 6/18 at 3:15pm.  Lovenox 30mg  SQ BID was started 6/19 at 0645  Low dose warfarin (2.5mg ) given 6/17 due to epidural. Warfarin dose 6/18 was ordered but not given.  Warfarin 5mg  given 6/19 - 6/21, INR = 1.58: trending up  Diet advanced 6/20, still c/o mild abd discomfort secondary to post-operative ileus, flatus and small BM this morning.  If oral warfarin is absorbed, dosage requirements could be lower than expected until patient is able to tolerate a regular diet and normal vitamin K intake.  Goal of Therapy:  INR 2-3   Plan:   Repeat Warfarin 5mg  PO today at 1800 as INR showing signs of trending up  Continue prophylactic-dose Lovenox as per ortho.  Check PT/INR daily.  Gwen Her PharmD   [phone] 12/19/2012 9:59 AM

## 2012-12-19 NOTE — Progress Notes (Signed)
Physical Therapy Treatment Patient Details Name: Kent Ryan MRN: [id number] DOB: 24-Feb-1951 Today's Date: 12/19/2012 Time: 1308-6578 PT Time Calculation (min): 28 min  PT Assessment / Plan / Recommendation Comments on Treatment Session  Pts stomach slowly feeling better, however did have increased pain in knees today and declined amb for first session.     Follow Up Recommendations  CIR     Does the patient have the potential to tolerate intense rehabilitation     Barriers to Discharge        Equipment Recommendations  Rolling walker with 5" wheels    Recommendations for Other Services    Frequency 7X/week   Plan Discharge plan remains appropriate    Precautions / Restrictions Precautions Precautions: Knee Precaution Comments: performed session without KI on LLE.   Required Braces or Orthoses: Knee Immobilizer - Right;Knee Immobilizer - Left Knee Immobilizer - Right: Discontinue once straight leg raise with < 10 degree lag Restrictions Weight Bearing Restrictions: No Other Position/Activity Restrictions: WBAT bilaterally   Pertinent Vitals/Pain Pt with 8/10 pain, ice packs applied, RN aware    Mobility  Bed Mobility Bed Mobility: Supine to Sit Supine to Sit: 1: +2 Total assist;With rails;HOB elevated Supine to Sit: Patient Percentage: 60% Details for Bed Mobility Assistance: Assist for BLEs out of bed with some assist for trunk, however pt did better today with following commands for hand placement on bed instead of rails to self assist.   Transfers Transfers: Sit to Stand;Stand to Sit;Stand Pivot Transfers Sit to Stand: 1: +2 Total assist;From elevated surface;From toilet;From chair/3-in-1;From bed Sit to Stand: Patient Percentage: 40% Stand to Sit: 1: +2 Total assist;With upper extremity assist;With armrests;To toilet;To bed Stand to Sit: Patient Percentage: 40% Stand Pivot Transfers: 1: +2 Total assist Stand Pivot Transfers: Patient Percentage: 60% Details for  Transfer Assistance: Continues to require increased assist from lower surface.  Performed x 2 from bed and 3in1.  Cues for hand placement and LE management. Declined ambulation due to increased pain and fatigue, but able to get to recliner with cues for technique.     Exercises     PT Diagnosis:    PT Problem List:   PT Treatment Interventions:     PT Goals Acute Rehab PT Goals PT Goal Formulation: With patient Time For Goal Achievement: 12/21/12 Potential to Achieve Goals: Good Pt will go Supine/Side to Sit: with min assist PT Goal: Supine/Side to Sit - Progress: Progressing toward goal Pt will go Sit to Stand: with mod assist PT Goal: Sit to Stand - Progress: Progressing toward goal Pt will go Stand to Sit: with mod assist PT Goal: Stand to Sit - Progress: Progressing toward goal Pt will Ambulate: 16 - 50 feet;with mod assist;with rolling walker PT Goal: Ambulate - Progress: Progressing toward goal  Visit Information  Last PT Received On: 12/19/12 Assistance Needed: +2    Subjective Data  Subjective: If I could get this stomach think worked out, I'd be ok.  Patient Stated Goal: to go to rehab   Cognition  Cognition Arousal/Alertness: Awake/alert Behavior During Therapy: WFL for tasks assessed/performed Overall Cognitive Status: Within Functional Limits for tasks assessed    Balance     End of Session PT - End of Session Equipment Utilized During Treatment: Gait belt;Right knee immobilizer Activity Tolerance: Patient limited by fatigue;Patient tolerated treatment well Patient left: in chair;with call bell/phone within reach Nurse Communication: Mobility status CPM Left Knee CPM Left Knee: Off CPM Right Knee CPM Right Knee: Off  GP     Vista Deck 12/19/2012, 11:37 AM

## 2012-12-19 NOTE — Progress Notes (Signed)
   Subjective: 6 Days Post-Op Procedure(s) (LRB): TOTAL KNEE BILATERAL (Bilateral) Patient reports pain as mild.   Patient seen in rounds with Dr. Darrelyn Hillock. Patient is feeling somewhat better than yesterday. Positive flatus.  He reports that he did have a small BM this morning. Denies chest pain and shortness of breath. No issues overnight. Continues to have some mild abdominal discomfort.  Plan is to go Rehab after hospital stay.  Objective: Vital signs in last 24 hours: Temp:  [97.5 F (36.4 C)-100.3 F (37.9 C)] 97.5 F (36.4 C) (06/22 0500) Pulse Rate:  [101-108] 101 (06/22 0500) Resp:  [16-20] 20 (06/22 0500) BP: (127-148)/(76-79) 148/79 mmHg (06/22 0500) SpO2:  [94 %-96 %] 94 % (06/22 0500)  Intake/Output from previous day:  Intake/Output Summary (Last 24 hours) at 12/19/12 0749 Last data filed at 12/19/12 0710  Gross per 24 hour  Intake 1581.67 ml  Output   2200 ml  Net -618.33 ml    Intake/Output this shift: Total I/O In: -  Out: 500 [Urine:500]  Labs:  Recent Labs  12/17/12 0508 12/18/12 0514  HGB 8.7* 8.0*    Recent Labs  12/17/12 0508 12/18/12 0514  WBC 10.2 8.9  RBC 3.09* 2.90*  HCT 26.0* 24.4*  PLT 218 217    Recent Labs  12/17/12 0508 12/18/12 0514  NA 133* 134*  K 4.0 3.7  CL 102 102  CO2 25 26  BUN 16 14  CREATININE 0.92 0.85  GLUCOSE 96 102*  CALCIUM 9.8 9.6    Recent Labs  12/18/12 0514 12/19/12 0431  INR 1.29 1.58*    EXAM General - Patient is Alert and Oriented Extremity - Neurologically intact Neurovascular intact Dorsiflexion/Plantar flexion intact No cellulitis present Compartment soft Dressing/Incision - clean, dry, no drainage Motor Function - intact, moving foot and toes well on exam.   Past Medical History  Diagnosis Date  . Hypertension   . Sleep apnea     cpap - does not know settings   . Arthritis     Assessment/Plan: 6 Days Post-Op Procedure(s) (LRB): TOTAL KNEE BILATERAL (Bilateral) Principal  Problem:   OA (osteoarthritis) of knee Active Problems:   Ileus, postoperative   Postoperative anemia due to acute blood loss   Hyponatremia  Estimated body mass index is 32.99 kg/(m^2) as calculated from the following:   Height as of this encounter: 6\' 1"  (1.854 m).   Weight as of this encounter: 113.399 kg (250 lb). Advance diet Up with therapy Plan for discharge tomorrow  DVT Prophylaxis - Coumadin Weight-Bearing as tolerated   Trang Bouse LAUREN 12/19/2012, 7:49 AM

## 2012-12-20 ENCOUNTER — Inpatient Hospital Stay (HOSPITAL_COMMUNITY): Payer: BC Managed Care – PPO

## 2012-12-20 LAB — BASIC METABOLIC PANEL
BUN: 10 mg/dL (ref 6–23)
CO2: 26 mEq/L (ref 19–32)
Chloride: 102 mEq/L (ref 96–112)
Glucose, Bld: 100 mg/dL — ABNORMAL HIGH (ref 70–99)
Potassium: 3.3 mEq/L — ABNORMAL LOW (ref 3.5–5.1)
Sodium: 134 mEq/L — ABNORMAL LOW (ref 135–145)

## 2012-12-20 LAB — CBC
HCT: 24 % — ABNORMAL LOW (ref 39.0–52.0)
Hemoglobin: 8 g/dL — ABNORMAL LOW (ref 13.0–17.0)
MCH: 28.1 pg (ref 26.0–34.0)
MCHC: 33.3 g/dL (ref 30.0–36.0)
MCV: 84.2 fL (ref 78.0–100.0)
RBC: 2.85 MIL/uL — ABNORMAL LOW (ref 4.22–5.81)

## 2012-12-20 MED ORDER — WARFARIN SODIUM 5 MG PO TABS
5.0000 mg | ORAL_TABLET | Freq: Once | ORAL | Status: AC
  Administered 2012-12-20: 5 mg via ORAL
  Filled 2012-12-20: qty 1

## 2012-12-20 MED ORDER — DEXTROSE-NACL 5-0.9 % IV SOLN
INTRAVENOUS | Status: DC
  Administered 2012-12-20 – 2012-12-21 (×4): via INTRAVENOUS

## 2012-12-20 MED ORDER — HYDROMORPHONE HCL 2 MG PO TABS
2.0000 mg | ORAL_TABLET | ORAL | Status: DC | PRN
  Administered 2012-12-20 – 2012-12-22 (×6): 2 mg via ORAL
  Filled 2012-12-20 (×6): qty 1

## 2012-12-20 NOTE — Progress Notes (Signed)
Spoke with pt in regards for readiness of nighttime CPAP, filled humidification chamber to appropriate level. Pt will self administer when ready, understands RT is available for any further assistance.

## 2012-12-20 NOTE — Progress Notes (Signed)
We will hold admission to inpt rehab today with continued bowel issues. [phone]

## 2012-12-20 NOTE — Progress Notes (Signed)
Subjective: 7 Days Post-Op Procedure(s) (LRB): TOTAL KNEE BILATERAL (Bilateral) Patient reports pain as mild and moderate.   Patient seen in rounds with Dr. Lequita Halt.  His knees are doing better, unfortunately, the abdomen continues to be a a problem.  He had a small BM on Friday, and then again on Saturday.  Yesterday, he states that he had two movements, one in the morning and again in the afternoon.  He felt better following the BMs.  He still feels tight in the belly despite the BMs.  He has been getting up with therapy and walking, 70 feet on Saturday and 85 feet on Sunday.  Despite a little progression with therapy and having movements, he does not look that much improved clinically. Will recheck labs and KUB again this morning.  The patient states that his IV started swelling over the weekend and the fluids were stopped.  Will restart IV fluids now.  He got an episode of lightheadedness yesterday when transferring so will check orthostatic pressures this morning. Patient is having problems with constipation Epidural to come out today.  Anticipate possible increase in pain temporarily after the epidural has been removed. Plan is to go Rehab after hospital stay.  Objective: Vital signs in last 24 hours: Temp:  [97.8 F (36.6 C)-100 F (37.8 C)] 99.6 F (37.6 C) (06/23 0520) Pulse Rate:  [100-104] 100 (06/23 0520) Resp:  [18-20] 18 (06/23 0520) BP: (112-139)/(71-80) 112/71 mmHg (06/23 0520) SpO2:  [92 %-95 %] 92 % (06/23 0520)  Intake/Output from previous day:  Intake/Output Summary (Last 24 hours) at 12/20/12 0659 Last data filed at 12/19/12 2200  Gross per 24 hour  Intake    480 ml  Output   1360 ml  Net   -880 ml    Intake/Output this shift: Total I/O In: 120 [P.O.:120] Out: 560 [Urine:500; Emesis/NG output:60]  Labs:  Recent Labs  12/18/12 0514 12/19/12 0857  HGB 8.0* 8.2*    Recent Labs  12/18/12 0514 12/19/12 0857  WBC 8.9 8.7  RBC 2.90* 2.92*  HCT 24.4*  24.6*  PLT 217 252    Recent Labs  12/18/12 0514  NA 134*  K 3.7  CL 102  BUN 14  CREATININE 0.85  GLUCOSE 102*  CALCIUM 9.6    Recent Labs  12/18/12 0514 12/19/12 0431 12/20/12 0455  INR 1.29 1.58* 1.76*    EXAM General - Patient is Alert, Appropriate and Oriented Extremity - Neurovascular intact Sensation intact distally Dorsiflexion/Plantar flexion intact No cellulitis present Dressing/Incision - clean, dry, no drainage, healing Motor Function - intact, moving feet and toes well on exam.  Abdomen - remains distended and sore to palpation.   Past Medical History  Diagnosis Date  . Hypertension   . Sleep apnea     cpap - does not know settings   . Arthritis     Assessment/Plan: 7 Days Post-Op Procedure(s) (LRB): TOTAL KNEE BILATERAL (Bilateral) Principal Problem:   OA (osteoarthritis) of knee Active Problems:   Ileus, postoperative   Postoperative anemia due to acute blood loss   Hyponatremia  Estimated body mass index is 32.99 kg/(m^2) as calculated from the following:   Height as of this encounter: 6\' 1"  (1.854 m).   Weight as of this encounter: 113.399 kg (250 lb). Up with therapy Recheck KUB and labs this morning. Check orthostatics Restart IV fluds Enema again this morning.  DVT Prophylaxis - Lovenox and Coumadin  Take Coumadin for four weeks and then discontinue.  The dose  may need to be adjusted based upon the INR.  Please follow the INR and titrate Coumadin dose for a therapeutic range between 2.0 and 3.0 INR.  After completing the four weeks of Coumadin, the patient may stop the Coumadin and resume their 81 mg Aspirin daily.  Lovenox injections will start tomorrow evening after the epidural has been removed and continue until the INR is therapeutic at or greater than 2.0.  When INR reaches the therapeutic level of equal to or greater than 2.0, the patient may discontinue the Lovenox injections.  PERKINS, ALEXZANDREW 12/20/2012, 6:59 AM

## 2012-12-20 NOTE — Progress Notes (Signed)
Occupational Therapy Treatment Patient Details Name: Ramell Wacha MRN: [id number] DOB: [birth year] Today's Date: 12/20/2012 Time: 7846-9629 OT Time Calculation (min): 27 min  OT Assessment / Plan / Recommendation Comments on Treatment Session  pt improving daily                   Plan Discharge plan remains appropriate    Precautions / Restrictions Precautions Precautions: Knee Precaution Comments: performed session without KI on LLE.   Required Braces or Orthoses: Knee Immobilizer - Right;Knee Immobilizer - Left Knee Immobilizer - Right: Discontinue once straight leg raise with < 10 degree lag Restrictions Weight Bearing Restrictions: No Other Position/Activity Restrictions: WBAT bilaterally       ADL  Upper Body Bathing: Performed;Set up Where Assessed - Upper Body Bathing: Unsupported sitting Lower Body Bathing: Performed;+2 Total assistance Lower Body Bathing: Patient Percentage: 70% Where Assessed - Lower Body Bathing: Supported sit to stand Upper Body Dressing: Set up Where Assessed - Upper Body Dressing: Unsupported sitting Where Assessed - Lower Body Dressing: Supported sit to stand Toilet Transfer: Performed;+2 Total assistance Toilet Transfer: Patient Percentage: 70% Statistician Method: Sit to stand;Stand pivot Acupuncturist: Extra wide bedside commode Toileting - Clothing Manipulation and Hygiene: Performed;+2 Total assistance Toileting - Architect and Hygiene: Patient Percentage: 70%      OT Goals ADL Goals Pt Will Perform Grooming: with supervision;Standing at sink ADL Goal: Grooming - Progress: Goal set today Pt Will Perform Lower Body Bathing: with supervision;Sit to stand from bed ADL Goal: Lower Body Bathing - Progress: Progressing toward goals Pt Will Transfer to Toilet: with min assist ADL Goal: Toilet Transfer - Progress: Goal set today  Visit Information  Last OT Received On: 12/20/12 Assistance Needed: +2     Subjective Data  Subjective: i would like to go a sponge bath- although I am use to a shower      Cognition  Cognition Arousal/Alertness: Awake/alert Behavior During Therapy: WFL for tasks assessed/performed Overall Cognitive Status: Within Functional Limits for tasks assessed    Mobility  Bed Mobility Bed Mobility: Sit to Supine Supine to Sit: 4: Min assist;HOB flat Sit to Supine: 3: Mod assist;HOB flat Details for Bed Mobility Assistance: Assist for BLEs into bed with cues for using UEs to scoot hips into bed.  Transfers Sit to Stand: 2: Max assist;With upper extremity assist;From chair/3-in-1 Sit to Stand: Patient Percentage: 60% Stand to Sit: 3: Mod assist;To elevated surface;To bed Stand to Sit: Patient Percentage: 60% Details for Transfer Assistance: Assist to rise and steady as pt stands and some assist for LEs when sitting with min cues for hand placement.        Balance Balance Balance Assessed: Yes Dynamic Standing Balance Dynamic Standing - Balance Support: Left upper extremity supported;During functional activity Dynamic Standing - Level of Assistance: 5: Stand by assistance Dynamic Standing - Balance Activities: Forward lean/weight shifting Dynamic Standing - Comments: Able to stand at sink to perform hygiene at stand by assist x approx 5 mins.    End of Session OT - End of Session Activity Tolerance: Patient tolerated treatment well Patient left: Other (comment);with call bell/phone within reach (on [facility])  GO     Danicka Hourihan, [name] 12/20/2012, 10:30 AM

## 2012-12-20 NOTE — Progress Notes (Signed)
ANTICOAGULATION CONSULT NOTE - Follow up  Pharmacy Consult for warfarin Indication: VTE prophylaxis  No Known Allergies  Patient Measurements: Height: 6\' 1"  (185.4 cm) Weight: 250 lb (113.399 kg) IBW/kg (Calculated) : 79.9  Vital Signs: Temp: 99.6 F (37.6 C) (06/23 0520) Temp src: Oral (06/23 0520) BP: 112/71 mmHg (06/23 0520) Pulse Rate: 100 (06/23 0520)  Labs:  Recent Labs  12/18/12 0514 12/19/12 0431 12/19/12 0857 12/20/12 0455 12/20/12 0728  HGB 8.0*  --  8.2*  --  8.0*  HCT 24.4*  --  24.6*  --  24.0*  PLT 217  --  252  --  287  LABPROT 15.8* 18.4*  --  19.9*  --   INR 1.29 1.58*  --  1.76*  --   CREATININE 0.85  --   --   --  0.79    Estimated Creatinine Clearance: 126.3 ml/min (by C-G formula based on Cr of 0.79).   Medications:  Scheduled:  . atorvastatin  40 mg Oral Daily  . docusate sodium  100 mg Oral BID  . enoxaparin (LOVENOX) injection  30 mg Subcutaneous BID  . latanoprost  1 drop Both Eyes QHS  . polyethylene glycol  17 g Oral Daily  . Warfarin - Pharmacist Dosing Inpatient   Does not apply q1800   Assessment: 61yo M s/p bilat TKA 6/16, on continuous epidural infusion postop. Pharmacy was asked to start warfarin on 6/17.   Epidural was removed (without complication, tip intact) 6/18 at 3:15pm.  Lovenox 30mg  SQ BID was started 6/19 at 0645  Low dose warfarin (2.5mg ) given 6/17 due to epidural. Warfarin dose 6/18 was ordered but not given.  Warfarin 5mg  given 6/19 - 6/22, INR = 1.76, trending up appropriately.   CBC: Hgb low, stable.  Plts ok. No bleeding documented.    Diet advanced to FLD 6/20, BMs documented last 3 days, however pt still c/o abdominal pain. KUB planned for later this AM.    Goal of Therapy:  INR 2-3   Plan:   Repeat Warfarin 5mg  PO today at 1800  Continue prophylactic-dose Lovenox as per ortho.  Check PT/INR daily.  Haynes Hoehn, PharmD 12/20/2012 8:15 AM  Pager: [phone]

## 2012-12-20 NOTE — Progress Notes (Signed)
Physical Therapy Treatment Patient Details Name: Kent Ryan MRN: [id number] DOB: 11/17/1950 Today's Date: 12/20/2012 Time: 4098-1191 PT Time Calculation (min): 23 min  PT Assessment / Plan / Recommendation Comments on Treatment Session  Continues to progress with mobility.  Decreased amb distance this morning due to increased pain.  RN notified.     Follow Up Recommendations  CIR     Does the patient have the potential to tolerate intense rehabilitation     Barriers to Discharge        Equipment Recommendations  Rolling walker with 5" wheels    Recommendations for Other Services    Frequency 7X/week   Plan Discharge plan remains appropriate    Precautions / Restrictions Precautions Precautions: Knee Precaution Comments: performed session without KI on LLE.   Required Braces or Orthoses: Knee Immobilizer - Right;Knee Immobilizer - Left Knee Immobilizer - Right: Discontinue once straight leg raise with < 10 degree lag Restrictions Weight Bearing Restrictions: No Other Position/Activity Restrictions: WBAT bilaterally   Pertinent Vitals/Pain 8/10, RN made aware.     Mobility  Bed Mobility Bed Mobility: Sit to Supine Supine to Sit: 4: Min assist;HOB flat Sit to Supine: 3: Mod assist;HOB flat Details for Bed Mobility Assistance: Assist for BLEs into bed with cues for using UEs to scoot hips into bed.  Transfers Transfers: Sit to Stand;Stand to Sit Sit to Stand: 2: Max assist;With upper extremity assist;From chair/3-in-1 Sit to Stand: Patient Percentage: 60% Stand to Sit: 3: Mod assist;To elevated surface;To bed Stand to Sit: Patient Percentage: 60% Stand Pivot Transfers: 1: +2 Total assist Stand Pivot Transfers: Patient Percentage: 70% Details for Transfer Assistance: Assist to rise and steady as pt stands and some assist for LEs when sitting with min cues for hand placement.  Ambulation/Gait Ambulation/Gait Assistance: 4: Min assist Ambulation Distance (Feet): 78  Feet Assistive device: Rolling walker Ambulation/Gait Assistance Details: Continue to provide verbal and manual cuing for more erect posture and for correct sequencing with RW.  Gait Pattern: Step-to pattern;Wide base of support;Decreased step length - right;Decreased step length - left;Shuffle Gait velocity: decreased    Exercises     PT Diagnosis:    PT Problem List:   PT Treatment Interventions:     PT Goals Acute Rehab PT Goals PT Goal Formulation: With patient Time For Goal Achievement: 12/21/12 Potential to Achieve Goals: Good Pt will go Supine/Side to Sit: with supervision PT Goal: Supine/Side to Sit - Progress: Updated due to goal met Pt will go Sit to Supine/Side: with min assist PT Goal: Sit to Supine/Side - Progress: Progressing toward goal Pt will go Sit to Stand: with mod assist PT Goal: Sit to Stand - Progress: Progressing toward goal Pt will go Stand to Sit: with min assist PT Goal: Stand to Sit - Progress: Updated due to goals met Pt will Ambulate: 51 - 150 feet;with least restrictive assistive device;with supervision PT Goal: Ambulate - Progress: Updated due to goal met  Visit Information  Last PT Received On: 12/20/12 Assistance Needed: +2    Subjective Data  Subjective: I feel a little better but my stomach still feels crampy.  Patient Stated Goal: to go to rehab   Cognition  Cognition Arousal/Alertness: Awake/alert Behavior During Therapy: WFL for tasks assessed/performed Overall Cognitive Status: Within Functional Limits for tasks assessed    Balance  Balance Balance Assessed: Yes Dynamic Standing Balance Dynamic Standing - Balance Support: Left upper extremity supported;During functional activity Dynamic Standing - Level of Assistance: 5: Stand by assistance  Dynamic Standing - Balance Activities: Forward lean/weight shifting Dynamic Standing - Comments: Able to stand at sink to perform hygiene at stand by assist x approx 5 mins.   End of Session  PT - End of Session Equipment Utilized During Treatment: Gait belt;Right knee immobilizer Activity Tolerance: Patient limited by pain Patient left: with call bell/phone within reach;in bed (in bed for xray) Nurse Communication: Mobility status   GP     Vista Deck 12/20/2012, 10:09 AM

## 2012-12-20 NOTE — Progress Notes (Signed)
I met with patient at bedside. Patient up with therapy this morning as well as up to [facility] with large semi formed BM observed. Portable abd xray completed, results pending. I await medical clearance as well as insurance approval to admit pt to inpt rehab possibly today or tomorrow. I will contact Bernestine Amass. To discuss. Patient is in agreement. [phone]

## 2012-12-20 NOTE — Progress Notes (Signed)
Pt and pt's sister stated to nurse that he has a family history with certain pain medications that causes the stomach to bloat.  These medications include oxycodone, hydrocodone, and ultram.  Pt and family stated that dilaudid is the only pain medication that does not do this. PA notified and new order for dilaudid po was given.

## 2012-12-20 NOTE — Progress Notes (Signed)
Physical Therapy Treatment Patient Details Name: Kent Ryan MRN: [id number] DOB: [birth year] Today's Date: 12/20/2012 Time: 9147-8295 PT Time Calculation (min): 27 min  PT Assessment / Plan / Recommendation Comments on Treatment Session  Pt continues to make progress with mobility and was assisted to 3in1 prior to amb this morning.     Follow Up Recommendations  CIR     Does the patient have the potential to tolerate intense rehabilitation     Barriers to Discharge        Equipment Recommendations  Rolling walker with 5" wheels    Recommendations for Other Services    Frequency 7X/week   Plan Discharge plan remains appropriate    Precautions / Restrictions Precautions Precautions: Knee Precaution Comments: performed session without KI on LLE.   Required Braces or Orthoses: Knee Immobilizer - Right;Knee Immobilizer - Left Knee Immobilizer - Right: Discontinue once straight leg raise with < 10 degree lag Restrictions Weight Bearing Restrictions: No Other Position/Activity Restrictions: WBAT bilaterally   Pertinent Vitals/Pain 8/10 pain, RN notified.     Mobility  Bed Mobility Bed Mobility: Supine to Sit Supine to Sit: 4: Min assist;HOB flat Details for Bed Mobility Assistance: Some assist for LEs (more guiding assist) out of bed with cues for hand placement on bed.  Pt continues to improve with bed mobility and relies less on handrails today.  Transfers Transfers: Sit to Stand;Stand to Sit;Stand Pivot Transfers Sit to Stand: 1: +2 Total assist;From elevated surface;From chair/3-in-1 Sit to Stand: Patient Percentage: 60% Stand to Sit: 1: +2 Total assist;With upper extremity assist;With armrests;To bed;To chair/3-in-1 Stand to Sit: Patient Percentage: 60% Stand Pivot Transfers: 1: +2 Total assist Stand Pivot Transfers: Patient Percentage: 70% Details for Transfer Assistance: Continues to show improvement in bringing LEs under him when he stands.  Min cues for hand  placement and safety.     Exercises     PT Diagnosis:    PT Problem List:   PT Treatment Interventions:     PT Goals Acute Rehab PT Goals PT Goal Formulation: With patient Time For Goal Achievement: 12/21/12 Potential to Achieve Goals: Good Pt will go Supine/Side to Sit: with supervision PT Goal: Supine/Side to Sit - Progress: Updated due to goal met Pt will go Sit to Stand: with mod assist PT Goal: Sit to Stand - Progress: Progressing toward goal Pt will go Stand to Sit: with mod assist PT Goal: Stand to Sit - Progress: Progressing toward goal  Visit Information  Last PT Received On: 12/20/12 Assistance Needed: +2    Subjective Data  Subjective: I'm feeling much better.  Patient Stated Goal: to go to rehab   Cognition  Cognition Arousal/Alertness: Awake/alert Behavior During Therapy: WFL for tasks assessed/performed Overall Cognitive Status: Within Functional Limits for tasks assessed    Balance  Balance Balance Assessed: Yes Dynamic Standing Balance Dynamic Standing - Balance Support: Left upper extremity supported;During functional activity Dynamic Standing - Level of Assistance: 5: Stand by assistance Dynamic Standing - Balance Activities: Lateral lean/weight shifting;Forward lean/weight shifting Dynamic Standing - Comments: Pt able to stand at RW to perform self care with single UE support x approx 10 mins.   End of Session PT - End of Session Equipment Utilized During Treatment: Gait belt;Right knee immobilizer Activity Tolerance: Patient tolerated treatment well Patient left: in chair;with call bell/phone within reach Nurse Communication: Mobility status   GP     Vista Deck 12/20/2012, 10:01 AM

## 2012-12-21 ENCOUNTER — Inpatient Hospital Stay (HOSPITAL_COMMUNITY): Payer: BC Managed Care – PPO

## 2012-12-21 LAB — PROTIME-INR: Prothrombin Time: 24.6 seconds — ABNORMAL HIGH (ref 11.6–15.2)

## 2012-12-21 LAB — BASIC METABOLIC PANEL
BUN: 8 mg/dL (ref 6–23)
CO2: 26 mEq/L (ref 19–32)
Chloride: 102 mEq/L (ref 96–112)
GFR calc Af Amer: >90 mL/min (ref >90)
Glucose, Bld: 117 mg/dL — ABNORMAL HIGH (ref 70–99)
Potassium: 3.1 mEq/L — ABNORMAL LOW (ref 3.5–5.1)

## 2012-12-21 LAB — CBC
MCH: 27.5 pg (ref 26.0–34.0)
MCHC: 32.9 g/dL (ref 30.0–36.0)
Platelets: 301 10*3/uL (ref 150–400)
RBC: 2.84 MIL/uL — ABNORMAL LOW (ref 4.22–5.81)

## 2012-12-21 MED ORDER — POTASSIUM CHLORIDE 10 MEQ/100ML IV SOLN
10.0000 meq | INTRAVENOUS | Status: AC
  Administered 2012-12-21 (×3): 10 meq via INTRAVENOUS
  Filled 2012-12-21 (×3): qty 100

## 2012-12-21 MED ORDER — WARFARIN SODIUM 5 MG PO TABS
5.0000 mg | ORAL_TABLET | Freq: Once | ORAL | Status: DC
  Administered 2012-12-21: 5 mg via ORAL
  Filled 2012-12-21 (×2): qty 1

## 2012-12-21 NOTE — Progress Notes (Signed)
   Subjective: 8 Days Post-Op Procedure(s) (LRB): TOTAL KNEE BILATERAL (Bilateral) Patient reports pain as mild and moderate.   Patient seen in rounds with Dr. Lequita Halt.  He states that he is doing better today.  Still had some nausea yesterday but belly is softer today. Patient is having problems with constipation Plan is to go Rehab after hospital stay.  Objective: Vital signs in last 24 hours: Temp:  [98.4 F (36.9 C)-99.9 F (37.7 C)] 98.4 F (36.9 C) (06/24 5366) Pulse Rate:  [99-111] 99 (06/24 0614) Resp:  [18-20] 20 (06/24 0614) BP: (130-162)/(70-93) 132/71 mmHg (06/24 0614) SpO2:  [93 %-97 %] 97 % (06/24 0614)  Intake/Output from previous day:  Intake/Output Summary (Last 24 hours) at 12/21/12 0807 Last data filed at 12/21/12 0640  Gross per 24 hour  Intake 2548.34 ml  Output   1825 ml  Net 723.34 ml    Intake/Output this shift:    Labs:  Recent Labs  12/19/12 0857 12/20/12 0728 12/21/12 0402  HGB 8.2* 8.0* 7.8*    Recent Labs  12/19/12 0857 12/20/12 0728 12/21/12 0402  WBC 8.7 8.7 8.4  RBC 2.92* 2.85* 2.84*  HCT 24.6* 24.0* 23.7*  PLT 252 287 301    Recent Labs  12/20/12 0728 12/21/12 0402  NA 134* 134*  K 3.3* 3.1*  CL 102 102  BUN 10 8  CREATININE 0.79 0.74  GLUCOSE 100* 117*  CALCIUM 9.6 9.5    Recent Labs  12/19/12 0431 12/20/12 0455 12/21/12 0402  INR 1.58* 1.76* 2.34*    EXAM General - Patient is Alert, Appropriate and Oriented Extremity - Neurovascular intact Sensation intact distally Dorsiflexion/Plantar flexion intact No cellulitis present Dressing/Incision - clean, dry, no drainage, healing Motor Function - intact, moving feet and toes well on exam.  Abdomen - softer to palpation, less distention.   Past Medical History  Diagnosis Date  . Hypertension   . Sleep apnea     cpap - does not know settings   . Arthritis     Assessment/Plan: 8 Days Post-Op Procedure(s) (LRB): TOTAL KNEE BILATERAL  (Bilateral) Principal Problem:   OA (osteoarthritis) of knee Active Problems:   Ileus, postoperative   Postoperative anemia due to acute blood loss   Hyponatremia  Estimated body mass index is 32.99 kg/(m^2) as calculated from the following:   Height as of this encounter: 6\' 1"  (1.854 m).   Weight as of this encounter: 113.399 kg (250 lb). Up with therapy IV potassium today. Recheck labs in the AM. Recheck KUB this morning.  X-ray is pending at time of note.  DVT Prophylaxis - Lovenox and Coumadin  Weight-Bearing as tolerated to both legs  Take Coumadin for four weeks and then discontinue.  The dose may need to be adjusted based upon the INR.  Please follow the INR and titrate Coumadin dose for a therapeutic range between 2.0 and 3.0 INR.  After completing the four weeks of Coumadin, the patient may stop the Coumadin and resume their 81 mg Aspirin daily.  Hopefully will be ready for rehab tomorrow.  Ashlee Player 12/21/2012, 8:07 AM

## 2012-12-21 NOTE — Progress Notes (Signed)
Physical Therapy Treatment Patient Details Name: Kent Ryan MRN: [id number] DOB: Sep 21, 1950 Today's Date: 12/21/2012 Time: 1308-6578 PT Time Calculation (min): 25 min  PT Assessment / Plan / Recommendation Comments on Treatment Session  POD # 8 B TKR and post op ileus currently on liquid diet. Assisted pt OOB to amb in hallway + 2 assist for safety/  Positioned in reclioner.  Pt requesting pain meds prior to TE's.  Will return later today to perform TE's.     Follow Up Recommendations  CIR     Does the patient have the potential to tolerate intense rehabilitation     Barriers to Discharge        Equipment Recommendations       Recommendations for Other Services    Frequency 7X/week   Plan Discharge plan remains appropriate    Precautions / Restrictions Precautions Precautions: Knee Precaution Comments: performed session without KI on LLE.   Restrictions Weight Bearing Restrictions: No Other Position/Activity Restrictions: WBAT bilaterally   Pertinent Vitals/Pain C/o 7/10 B knee pain with act meds requested    Mobility  Bed Mobility Bed Mobility: Sit to Supine Supine to Sit: 4: Min assist;HOB flat Details for Bed Mobility Assistance: min assist to support B LE's OOB and to floor with VC's to bend B knees Transfers Transfers: Sit to Stand;Stand to Sit Sit to Stand: 1: +2 Total assist Sit to Stand: Patient Percentage: 70% Stand to Sit: 1: +2 Total assist Stand to Sit: Patient Percentage: 70% Details for Transfer Assistance: Assist to rise and steady as pt stands and some assist for LEs when sitting with min cues for hand placement.  Ambulation/Gait Ambulation/Gait Assistance: 1: +2 Total assist Ambulation/Gait: Patient Percentage: 80% Ambulation Distance (Feet): 72 Feet Assistive device: Rolling walker Ambulation/Gait Assistance Details: + 2 assist for safety and chair to follow.  Amb w/o KI's due to POD # 8.  VC's to increase heell strike/knee flexion. Gait  Pattern: Step-to pattern;Wide base of support;Decreased step length - right;Decreased step length - left;Shuffle Gait velocity: decreased     PT Goals    Visit Information  Last PT Received On: 12/21/12 Assistance Needed: +2    Subjective Data      Cognition       Balance     End of Session PT - End of Session Equipment Utilized During Treatment: Gait belt Activity Tolerance: Patient limited by pain Patient left: in chair Nurse Communication: Patient requests pain meds     Felecia Shelling  PTA [facility]  Acute  Rehab Pager [phone]

## 2012-12-21 NOTE — Progress Notes (Signed)
ANTICOAGULATION CONSULT NOTE - Follow up  Pharmacy Consult for warfarin Indication: VTE prophylaxis  No Known Allergies  Patient Measurements: Height: 6\' 1"  (185.4 cm) Weight: 250 lb (113.399 kg) IBW/kg (Calculated) : 79.9  Vital Signs: Temp: 98.4 F (36.9 C) (06/24 0614) Temp src: Oral (06/24 0614) BP: 132/71 mmHg (06/24 0614) Pulse Rate: 99 (06/24 0614)  Labs:  Recent Labs  12/19/12 0431  12/19/12 0857 12/20/12 0455 12/20/12 0728 12/21/12 0402  HGB  --   < > 8.2*  --  8.0* 7.8*  HCT  --   --  24.6*  --  24.0* 23.7*  PLT  --   --  252  --  287 301  LABPROT 18.4*  --   --  19.9*  --  24.6*  INR 1.58*  --   --  1.76*  --  2.34*  CREATININE  --   --   --   --  0.79 0.74  < > = values in this interval not displayed.  Estimated Creatinine Clearance: 126.3 ml/min (by C-G formula based on Cr of 0.74). Medications:  Scheduled:  . atorvastatin  40 mg Oral Daily  . docusate sodium  100 mg Oral BID  . enoxaparin (LOVENOX) injection  30 mg Subcutaneous BID  . latanoprost  1 drop Both Eyes QHS  . polyethylene glycol  17 g Oral Daily  . potassium chloride  10 mEq Intravenous Q1 Hr x 3  . Warfarin - Pharmacist Dosing Inpatient   Does not apply q1800   Assessment: 61yo M s/p bilat TKA 6/16, on continuous epidural infusion postop. Pharmacy was asked to start warfarin on 6/17.   Epidural was removed (without complication, tip intact) 6/18 at 3:15pm.  Lovenox 30mg  SQ BID was started 6/19 at 0645  Low dose warfarin (2.5mg ) given 6/17 due to epidural. Warfarin dose 6/18 was ordered but not given.  Warfarin 5mg  given 6/19 - 6/23, INR = 2.34, in therapeutic range   Lovenox can be discontinued  CBC: Hgb low, stable.  Plts ok. No bleeding documented.    Diet advanced to FLD 6/20, BMs documented. Pt c/o abdominal pain, KUB rpt today: slight improvement of ileus    Goal of Therapy:  INR 2-3   Plan:   Repeat Warfarin 5mg  PO today at 1800  Lovenox d/c today per Ortho  Check  PT/INR daily.  To CIR when bowel issue improved  Otho Bellows PharmD Pager [phone] 12/21/2012, 10:06 AM

## 2012-12-21 NOTE — Progress Notes (Signed)
Discussed with pt as well as Julien Girt, [place]. We are holding admit to inpt rehab today. [phone]

## 2012-12-21 NOTE — Progress Notes (Signed)
RT filled H2O chamber for CPAP, Pt stated that he will self administer when ready.  RT to monitor and assess as needed.

## 2012-12-21 NOTE — Progress Notes (Signed)
Physical Therapy Treatment Patient Details Name: Finas Delone MRN: [id number] DOB: [birth year] Today's Date: 12/21/2012 Time: 7829-5621 PT Time Calculation (min): 32 min  PT Assessment / Plan / Recommendation Comments on Treatment Session  POD # 8 B TKR and post op ileus currently on liquid diet.  Tx session focused on TE's esp knee flexion.   Follow Up Recommendations  CIR     Does the patient have the potential to tolerate intense rehabilitation     Barriers to Discharge        Equipment Recommendations       Recommendations for Other Services    Frequency 7X/week   Plan Discharge plan remains appropriate    Precautions / Restrictions Precautions Precautions: Knee Precaution Comments: performed session without KI on LLE.   Restrictions Weight Bearing Restrictions: No Other Position/Activity Restrictions: WBAT bilaterally   Pertinent Vitals/Pain C/o 6/10 with TE's       Exercises   Total Knee Replacement TE's 10 reps B LE ankle pumps 10 reps knee presses 10 reps heel slides  10 reps SAQ's 10 reps SLR's 10 reps ABD Followed by ICE    PT Goals                                                                Progressing     Visit Information  Last PT Received On: 12/21/12 Assistance Needed: +2    Subjective Data      Cognition       Balance     End of Session PT - End of Session Equipment Utilized During Treatment: Gait belt Activity Tolerance: Patient limited by pain Patient left: in chair Nurse Communication: Patient requests pain meds   Felecia Shelling  PTA [facility]  Acute  Rehab Pager      [phone]

## 2012-12-22 ENCOUNTER — Inpatient Hospital Stay (HOSPITAL_COMMUNITY)
Admission: RE | Admit: 2012-12-22 | Discharge: 2013-01-06 | DRG: 462 | Disposition: A | Discharge status: Home Health | Payer: BC Managed Care – PPO | Source: Intra-hospital | Attending: Physical Medicine & Rehabilitation | Admitting: Physical Medicine & Rehabilitation

## 2012-12-22 ENCOUNTER — Encounter (HOSPITAL_COMMUNITY): Payer: Self-pay | Admitting: *Deleted

## 2012-12-22 DIAGNOSIS — Z5189 Encounter for other specified aftercare: Principal | ICD-10-CM

## 2012-12-22 DIAGNOSIS — R112 Nausea with vomiting, unspecified: Secondary | ICD-10-CM | POA: Diagnosis present

## 2012-12-22 DIAGNOSIS — G4733 Obstructive sleep apnea (adult) (pediatric): Secondary | ICD-10-CM | POA: Diagnosis present

## 2012-12-22 DIAGNOSIS — K560 Paralytic ileus: Secondary | ICD-10-CM | POA: Diagnosis present

## 2012-12-22 DIAGNOSIS — M1710 Unilateral primary osteoarthritis, unspecified knee: Secondary | ICD-10-CM | POA: Diagnosis present

## 2012-12-22 DIAGNOSIS — E871 Hypo-osmolality and hyponatremia: Secondary | ICD-10-CM

## 2012-12-22 DIAGNOSIS — Z96659 Presence of unspecified artificial knee joint: Secondary | ICD-10-CM

## 2012-12-22 DIAGNOSIS — E785 Hyperlipidemia, unspecified: Secondary | ICD-10-CM | POA: Diagnosis present

## 2012-12-22 DIAGNOSIS — Z96653 Presence of artificial knee joint, bilateral: Secondary | ICD-10-CM

## 2012-12-22 DIAGNOSIS — D62 Acute posthemorrhagic anemia: Secondary | ICD-10-CM | POA: Diagnosis present

## 2012-12-22 DIAGNOSIS — M7989 Other specified soft tissue disorders: Secondary | ICD-10-CM

## 2012-12-22 DIAGNOSIS — I10 Essential (primary) hypertension: Secondary | ICD-10-CM | POA: Diagnosis present

## 2012-12-22 LAB — PROTIME-INR
INR: 2.52 — ABNORMAL HIGH (ref 0.00–1.49)
Prothrombin Time: 26 seconds — ABNORMAL HIGH (ref 11.6–15.2)

## 2012-12-22 LAB — CBC
MCHC: 31.6 g/dL (ref 30.0–36.0)
Platelets: 334 10*3/uL (ref 150–400)
RDW: 13.1 % (ref 11.5–15.5)
WBC: 9.8 10*3/uL (ref 4.0–10.5)

## 2012-12-22 LAB — BASIC METABOLIC PANEL
BUN: 8 mg/dL (ref 6–23)
Creatinine, Ser: 0.87 mg/dL (ref 0.50–1.35)
GFR calc Af Amer: >90 mL/min (ref >90)
GFR calc non Af Amer: >90 mL/min (ref >90)

## 2012-12-22 MED ORDER — POLYETHYLENE GLYCOL 3350 17 G PO PACK
17.0000 g | PACK | Freq: Every day | ORAL | Status: DC
Start: 2012-12-23 — End: 2013-01-06
  Administered 2012-12-23 – 2013-01-06 (×14): 17 g via ORAL
  Filled 2012-12-22 (×16): qty 1

## 2012-12-22 MED ORDER — SORBITOL 70 % SOLN: 30.0000 mL | Freq: Every day | Status: DC | PRN

## 2012-12-22 MED ORDER — ATORVASTATIN CALCIUM 40 MG PO TABS
40.0000 mg | ORAL_TABLET | Freq: Every day | ORAL | Status: DC
  Administered 2012-12-23 – 2013-01-05 (×14): 40 mg via ORAL
  Filled 2012-12-22 (×15): qty 1

## 2012-12-22 MED ORDER — ATORVASTATIN CALCIUM 40 MG PO TABS
40.0000 mg | ORAL_TABLET | Freq: Every day | ORAL | Status: DC
  Filled 2012-12-22 (×2): qty 1

## 2012-12-22 MED ORDER — WARFARIN - PHARMACIST DOSING INPATIENT
Freq: Every day | Status: DC
  Administered 2012-12-28 – 2012-12-31 (×2)

## 2012-12-22 MED ORDER — CHLORPROMAZINE HCL 10 MG PO TABS
10.0000 mg | ORAL_TABLET | Freq: Three times a day (TID) | ORAL | Status: DC | PRN
  Administered 2012-12-22: 10 mg via ORAL
  Filled 2012-12-22 (×2): qty 1

## 2012-12-22 MED ORDER — POLYETHYLENE GLYCOL 3350 17 G PO PACK: 17.0000 g | PACK | Freq: Every day | ORAL | Status: DC

## 2012-12-22 MED ORDER — METOCLOPRAMIDE HCL 5 MG/ML IJ SOLN
5.0000 mg | Freq: Three times a day (TID) | INTRAMUSCULAR | Status: DC | PRN
  Filled 2012-12-22: qty 2

## 2012-12-22 MED ORDER — METOCLOPRAMIDE HCL 5 MG PO TABS: 5.0000 mg | ORAL_TABLET | Freq: Three times a day (TID) | ORAL | Status: DC | PRN

## 2012-12-22 MED ORDER — ALUM & MAG HYDROXIDE-SIMETH 200-200-20 MG/5ML PO SUSP
30.0000 mL | ORAL | Status: DC | PRN
Start: 2012-12-22 — End: 2013-01-06

## 2012-12-22 MED ORDER — METHOCARBAMOL 500 MG PO TABS: 500.0000 mg | ORAL_TABLET | Freq: Four times a day (QID) | ORAL | Status: DC | PRN

## 2012-12-22 MED ORDER — WARFARIN SODIUM 5 MG PO TABS: 5.0000 mg | ORAL_TABLET | Freq: Every day | ORAL | Status: DC

## 2012-12-22 MED ORDER — LATANOPROST 0.005 % OP SOLN
1.0000 [drp] | Freq: Every day | OPHTHALMIC | Status: DC
  Administered 2012-12-22 – 2013-01-05 (×15): 1 [drp] via OPHTHALMIC
  Filled 2012-12-22 (×2): qty 2.5

## 2012-12-22 MED ORDER — METOCLOPRAMIDE HCL 5 MG PO TABS
5.0000 mg | ORAL_TABLET | Freq: Three times a day (TID) | ORAL | Status: DC | PRN
  Filled 2012-12-22: qty 2

## 2012-12-22 MED ORDER — BISACODYL 10 MG RE SUPP: 10.0000 mg | Freq: Every day | RECTAL | Status: DC | PRN

## 2012-12-22 MED ORDER — ONDANSETRON HCL 4 MG/2ML IJ SOLN: 4.0000 mg | Freq: Four times a day (QID) | INTRAMUSCULAR | Status: DC | PRN

## 2012-12-22 MED ORDER — ONDANSETRON HCL 4 MG PO TABS: 4.0000 mg | ORAL_TABLET | Freq: Four times a day (QID) | ORAL | Status: DC | PRN

## 2012-12-22 MED ORDER — FLUTICASONE PROPIONATE 50 MCG/ACT NA SUSP: 2.0000 | Freq: Every day | NASAL | Status: DC | PRN

## 2012-12-22 MED ORDER — DIPHENHYDRAMINE HCL 25 MG PO CAPS: 25.0000 mg | ORAL_CAPSULE | ORAL | Status: DC | PRN

## 2012-12-22 MED ORDER — HYDROMORPHONE HCL 2 MG PO TABS: 2.0000 mg | ORAL_TABLET | ORAL | Status: DC | PRN

## 2012-12-22 MED ORDER — WARFARIN SODIUM 5 MG PO TABS
5.0000 mg | ORAL_TABLET | Freq: Once | ORAL | Status: AC
  Administered 2012-12-22: 5 mg via ORAL
  Filled 2012-12-22: qty 1

## 2012-12-22 MED ORDER — HYDROMORPHONE HCL 2 MG PO TABS
2.0000 mg | ORAL_TABLET | ORAL | Status: DC | PRN
  Administered 2012-12-22 – 2012-12-29 (×35): 4 mg via ORAL
  Administered 2012-12-29 (×2): 2 mg via ORAL
  Administered 2012-12-29 – 2012-12-31 (×10): 4 mg via ORAL
  Administered 2012-12-31 – 2013-01-01 (×2): 2 mg via ORAL
  Administered 2013-01-01 – 2013-01-05 (×18): 4 mg via ORAL
  Administered 2013-01-05: 2 mg via ORAL
  Administered 2013-01-05 – 2013-01-06 (×5): 4 mg via ORAL
  Filled 2012-12-22 (×12): qty 2
  Filled 2012-12-22: qty 3
  Filled 2012-12-22 (×20): qty 2
  Filled 2012-12-22: qty 1
  Filled 2012-12-22 (×19): qty 2
  Filled 2012-12-22: qty 1
  Filled 2012-12-22 (×21): qty 2

## 2012-12-22 MED ORDER — SENNOSIDES-DOCUSATE SODIUM 8.6-50 MG PO TABS
2.0000 | ORAL_TABLET | Freq: Two times a day (BID) | ORAL | Status: DC
  Filled 2012-12-22: qty 2

## 2012-12-22 MED ORDER — ALUM & MAG HYDROXIDE-SIMETH 200-200-20 MG/5ML PO SUSP: 30.0000 mL | ORAL | Status: DC | PRN

## 2012-12-22 MED ORDER — DSS 100 MG PO CAPS: 100.0000 mg | ORAL_CAPSULE | Freq: Two times a day (BID) | ORAL | Status: DC

## 2012-12-22 MED ORDER — METHOCARBAMOL 500 MG PO TABS
500.0000 mg | ORAL_TABLET | Freq: Four times a day (QID) | ORAL | Status: DC | PRN
  Administered 2012-12-23 – 2013-01-05 (×25): 500 mg via ORAL
  Filled 2012-12-22 (×27): qty 1

## 2012-12-22 MED ORDER — FLEET ENEMA 7-19 GM/118ML RE ENEM: 1.0000 | ENEMA | Freq: Every day | RECTAL | Status: DC | PRN

## 2012-12-22 MED ORDER — ACETAMINOPHEN 325 MG PO TABS
325.0000 mg | ORAL_TABLET | ORAL | Status: DC | PRN
  Administered 2012-12-23 – 2012-12-28 (×4): 650 mg via ORAL
  Filled 2012-12-22 (×4): qty 2

## 2012-12-22 NOTE — Progress Notes (Signed)
Pt d/c to CIR today. SNF placement not required.  Cori Razor LCSW [phone]

## 2012-12-22 NOTE — Progress Notes (Signed)
Bilateral lower extremity venous duplex:  No evidence of DVT, superficial thrombosis, or Baker's Cyst.   

## 2012-12-22 NOTE — Progress Notes (Signed)
Report given to [facility] at [facility] Stanford Breed RN 12-22-2012 10:50am

## 2012-12-22 NOTE — Discharge Summary (Signed)
Physician Discharge Summary   Patient ID: Kent Ryan MRN: [id number] DOB/AGE: March 22, 1951 62 y.o.  Admit date: 12/13/2012 Discharge date: 12/22/2012  Primary Diagnosis:  Osteoarthritis Bilateral knee(s)  Admission Diagnoses:  Past Medical History  Diagnosis Date  . Hypertension   . Sleep apnea     cpap - does not know settings   . Arthritis    Discharge Diagnoses:   Principal Problem:   OA (osteoarthritis) of knee Active Problems:   Ileus, postoperative   Postoperative anemia due to acute blood loss   Hyponatremia  Estimated body mass index is 32.99 kg/(m^2) as calculated from the following:   Height as of this encounter: 6\' 1"  (1.854 m).   Weight as of this encounter: 113.399 kg (250 lb).  Procedure:  Procedure(s) (LRB): TOTAL KNEE BILATERAL (Bilateral)   Consults: [facility]  HPI: Kent Ryan is a 62 y.o. year old male with end stage OA of both knees with progressively worsening pain and dysfunction. He has constant pain, with activity and at rest and significant functional deficits with difficulties even with ADLs. He has had extensive non-op management including analgesics, injections of cortisone and home exercise program, but remains in significant pain with significant dysfunction. We discussed pros and cons of doing both knees at the same setting vs one knee at a time including procedure, risks and potential complications with both and he chose to do both at the same time. Radiographs show bilateral severe tricomparmental OA with bone on bone all compartments.He presents now for bilateral TKA.   Laboratory Data: Admission on 12/22/2012  Component Date Value Range Status  . WBC 12/23/2012 11.1* 4.0 - 10.5 K/uL Final  . RBC 12/23/2012 2.91* 4.22 - 5.81 MIL/uL Final  . Hemoglobin 12/23/2012 8.0* 13.0 - 17.0 g/dL Final  . HCT 40/98/1191 24.6* 39.0 - 52.0 % Final  . MCV 12/23/2012 84.5  78.0 - 100.0 fL Final  . MCH 12/23/2012 27.5  26.0 - 34.0 pg Final    . MCHC 12/23/2012 32.5  30.0 - 36.0 g/dL Final  . RDW 47/82/9562 13.5  11.5 - 15.5 % Final  . Platelets 12/23/2012 400  150 - 400 K/uL Final  . Neutrophils Relative % 12/23/2012 76  43 - 77 % Final  . Lymphocytes Relative 12/23/2012 13  12 - 46 % Final  . Monocytes Relative 12/23/2012 8  3 - 12 % Final  . Eosinophils Relative 12/23/2012 3  0 - 5 % Final  . Basophils Relative 12/23/2012 0  0 - 1 % Final  . Neutro Abs 12/23/2012 8.5* 1.7 - 7.7 K/uL Final  . Lymphs Abs 12/23/2012 1.4  0.7 - 4.0 K/uL Final  . Monocytes Absolute 12/23/2012 0.9  0.1 - 1.0 K/uL Final  . Eosinophils Absolute 12/23/2012 0.3  0.0 - 0.7 K/uL Final  . Basophils Absolute 12/23/2012 0.0  0.0 - 0.1 K/uL Final  . RBC Morphology 12/23/2012 POLYCHROMASIA PRESENT   Final   RARE NRBCs  . WBC Morphology 12/23/2012 MILD LEFT SHIFT (1-5% METAS, OCC MYELO, OCC BANDS)   Final  . Sodium 12/23/2012 137  135 - 145 mEq/L Final  . Potassium 12/23/2012 3.4* 3.5 - 5.1 mEq/L Final  . Chloride 12/23/2012 104  96 - 112 mEq/L Final  . CO2 12/23/2012 27  19 - 32 mEq/L Final  . Glucose, Bld 12/23/2012 96  70 - 99 mg/dL Final  . BUN 13/01/6577 9  6 - 23 mg/dL Final  . Creatinine, Ser 12/23/2012 0.90  0.50 - 1.35  mg/dL Final  . Calcium 16/03/9603 9.6  8.4 - 10.5 mg/dL Final  . Total Protein 12/23/2012 5.3* 6.0 - 8.3 g/dL Final  . Albumin 54/02/8118 2.1* 3.5 - 5.2 g/dL Final  . AST 14/78/2956 57* 0 - 37 U/L Final  . ALT 12/23/2012 90* 0 - 53 U/L Final  . Alkaline Phosphatase 12/23/2012 94  39 - 117 U/L Final  . Total Bilirubin 12/23/2012 0.8  0.3 - 1.2 mg/dL Final  . GFR calc non Af Amer 12/23/2012 89* >90 mL/min Final  . GFR calc Af Amer 12/23/2012 >90  >90 mL/min Final   Comment:                                 The eGFR has been calculated                          using the CKD EPI equation.                          This calculation has not been                          validated in all clinical                          situations.                           eGFR's persistently                          <90 mL/min signify                          possible Chronic Kidney Disease.  Marland Kitchen Prothrombin Time 12/23/2012 22.5* 11.6 - 15.2 seconds Final  . INR 12/23/2012 2.05* 0.00 - 1.49 Final  . Prothrombin Time 12/24/2012 20.6* 11.6 - 15.2 seconds Final  . INR 12/24/2012 1.83* 0.00 - 1.49 Final  . Prothrombin Time 12/25/2012 18.9* 11.6 - 15.2 seconds Final  . INR 12/25/2012 1.63* 0.00 - 1.49 Final  . Prothrombin Time 12/26/2012 20.5* 11.6 - 15.2 seconds Final  . INR 12/26/2012 1.82* 0.00 - 1.49 Final  . WBC 12/26/2012 12.4* 4.0 - 10.5 K/uL Final  . RBC 12/26/2012 3.36* 4.22 - 5.81 MIL/uL Final  . Hemoglobin 12/26/2012 9.1* 13.0 - 17.0 g/dL Final  . HCT 21/30/8657 28.5* 39.0 - 52.0 % Final  . MCV 12/26/2012 84.8  78.0 - 100.0 fL Final  . MCH 12/26/2012 27.1  26.0 - 34.0 pg Final  . MCHC 12/26/2012 31.9  30.0 - 36.0 g/dL Final  . RDW 84/69/6295 13.9  11.5 - 15.5 % Final  . Platelets 12/26/2012 445* 150 - 400 K/uL Final  . Prothrombin Time 12/27/2012 20.9* 11.6 - 15.2 seconds Final  . INR 12/27/2012 1.86* 0.00 - 1.49 Final  . Prothrombin Time 12/28/2012 22.1* 11.6 - 15.2 seconds Final  . INR 12/28/2012 2.00* 0.00 - 1.49 Final  . Prothrombin Time 12/29/2012 24.3* 11.6 - 15.2 seconds Final  . INR 12/29/2012 2.27* 0.00 - 1.49 Final  . Prothrombin Time 12/30/2012 20.5* 11.6 - 15.2 seconds Final  . INR 12/30/2012 1.82* 0.00 - 1.49 Final  Admission on 12/13/2012,  Discharged on 12/22/2012  No results displayed because visit has over 200 results.    Hospital Outpatient Visit on 12/07/2012  Component Date Value Range Status  . aPTT 12/07/2012 32  24 - 37 seconds Final  . Prothrombin Time 12/07/2012 12.9  11.6 - 15.2 seconds Final  . INR 12/07/2012 0.98  0.00 - 1.49 Final  . ABO/RH(D) 12/07/2012 A POS   Final  . Antibody Screen 12/07/2012 NEG   Final  . Sample Expiration 12/07/2012 12/16/2012   Final  . Color, Urine 12/07/2012  YELLOW  YELLOW Final  . APPearance 12/07/2012 CLEAR  CLEAR Final  . Specific Gravity, Urine 12/07/2012 1.021  1.005 - 1.030 Final  . pH 12/07/2012 5.0  5.0 - 8.0 Final  . Glucose, UA 12/07/2012 NEGATIVE  NEGATIVE mg/dL Final  . Hgb urine dipstick 12/07/2012 NEGATIVE  NEGATIVE Final  . Bilirubin Urine 12/07/2012 NEGATIVE  NEGATIVE Final  . Ketones, ur 12/07/2012 NEGATIVE  NEGATIVE mg/dL Final  . Protein, ur 16/03/9603 NEGATIVE  NEGATIVE mg/dL Final  . Urobilinogen, UA 12/07/2012 0.2  0.0 - 1.0 mg/dL Final  . Nitrite 54/02/8118 POSITIVE* NEGATIVE Final  . Leukocytes, UA 12/07/2012 MODERATE* NEGATIVE Final  . MRSA, PCR 12/07/2012 NEGATIVE  NEGATIVE Final  . Staphylococcus aureus 12/07/2012 NEGATIVE  NEGATIVE Final   Comment:                                 The Xpert SA Assay (FDA                          approved for NASAL specimens                          in patients over 39 years of age),                          is one component of                          a comprehensive surveillance                          program.  Test performance has                          been validated by [employer] for patients greater                          than or equal to 51 year old.                          It is not intended                          to diagnose infection nor to                          guide or monitor treatment.  Marland Kitchen Specimen Description 12/07/2012 URINE, CLEAN CATCH   Final  .  Special Requests 12/07/2012 NONE   Final  . Culture  Setup Time 12/07/2012 12/08/2012 00:18   Final  . Colony Count 12/07/2012 >=100,000 COLONIES/ML   Final  . Culture 12/07/2012 ESCHERICHIA COLI   Final  . Report Status 12/07/2012 12/09/2012 FINAL   Final  . Organism ID, Bacteria 12/07/2012 ESCHERICHIA COLI   Final  . Squamous Epithelial / LPF 12/07/2012 RARE  RARE Final  . WBC, UA 12/07/2012 7-10  <3 WBC/hpf Final  . Bacteria, UA 12/07/2012 MANY* RARE Final  . ABO/RH(D)  12/07/2012 A POS   Final     X-Rays:Dg Chest 1 View  12/13/2012   *RADIOLOGY REPORT*  Clinical Data: Possible pulmonary nodule versus prominent nipple shadow.  CHEST - 1 VIEW  Comparison: 12/07/2012 radiographs.  Findings: PA view with nipple markers demonstrates the previously demonstrated nodular density on the left to correspond with the left nipple shadow.  There is no evidence of pulmonary nodule.  The lungs appear clear.  Heart size and mediastinal contours appear normal.  IMPRESSION: Previously demonstrated nodular density appears to represent the left nipple shadow.  No evidence of pulmonary nodule.   Original Report Authenticated By: Carey Bullocks, M.D.   Dg Chest 2 View  12/07/2012   *RADIOLOGY REPORT*  Clinical Data:  Hypertension, preoperative assessment for bilateral knee surgery, former smoker  CHEST - 2 VIEW  Comparison: None  Findings: Normal heart size, mediastinal contours, and pulmonary vascularity. Peribronchial thickening without infiltrate, pleural effusion or pneumothorax. Questionable nodular density versus nipple shadow left lung. Scattered endplate spur formation thoracic spine.  IMPRESSION: Minimal bronchitic changes. Question left nipple shadow versus nodule; repeat PA chest radiograph with nipple markers recommended to exclude pulmonary nodule.   Original Report Authenticated By: Ulyses Southward, M.D.   Dg Abd 1 View  12/21/2012   *RADIOLOGY REPORT*  Clinical Data: Ileus with nausea and vomiting.  ABDOMEN - 1 VIEW  Comparison: 12/20/2012  Findings: Gaseous dilatation of predominately the colon is relatively stable and may be minimally improved.  There is no evidence of small bowel obstruction.  No abnormal calcifications are identified.  IMPRESSION: Stable to minimally improved colonic ileus.   Original Report Authenticated By: Irish Lack, M.D.   Dg Abd 1 View  12/20/2012   *RADIOLOGY REPORT*  Clinical Data: Postop ileus. Compare to previous films.  Distended abdomen.  Bowel  movement this morning.  ABDOMEN - 1 VIEW  Comparison: 12/17/2012  Findings: There is a small amount of free intraperitoneal air. There is gaseous distension of large and small bowel loops, most consistent with ileus.  Degenerative changes are seen in the spine.  IMPRESSION:  1.  Small amount of free intraperitoneal, likely residual postoperative gas. 2.  Ileus, unchanged.   Original Report Authenticated By: Norva Pavlov, M.D.   Dg Abd 1 View  12/17/2012   *RADIOLOGY REPORT*  Clinical Data: Follow-up postop ileus.  Abdominal distention, pain.  ABDOMEN - 1 VIEW  Comparison: 12/16/2012  Findings: Diffuse gaseous distention of bowel, predominately colon. Findings are similar to prior study.  No free air.  No organomegaly or suspicious calcification.  IMPRESSION: Continued ileus pattern, unchanged.   Original Report Authenticated By: Charlett Nose, M.D.   Dg Abd 1 View  12/16/2012   *RADIOLOGY REPORT*  Clinical Data: Abdominal pain and distention.  Nausea and vomiting.  ABDOMEN - 1 VIEW  Comparison: None.  Findings: There is extensive air throughout the slightly distended colon.  There is no small bowel dilatation but there is air in the small  bowel and stomach.  No acute osseous abnormality.  IMPRESSION: Ileus.   Original Report Authenticated By: Francene Boyers, M.D.    EKG:No orders found for this or any previous visit.   Hospital Course: Patient was admitted to [facility] and taken to the OR and underwent the above stated procedure well without complications.  Patient tolerated the procedure well and was later transferred to the recovery room and then to the orthopaedic floor for postoperative care. Anesthesia was consulted postoperatively to place an epidural in for postoperative pain management. The patient was also given PO and IV analgesics for pain control following their surgery.  They were given 24 hours of postoperative antibiotics and started on DVT prophylaxis in the form of Lovenox and  Coumadin after the epidural had been removed.   PT and OT were ordered for total joint protocol.  Discharge planning consulted to help with postop disposition and equipment needs. [facility] was consulted to evaluate the patient for [facility]. Patient had a rough night on the evening of surgery. POD 1 - Patient was seen in rounds for Dr. Lequita Halt. The right leg was hurting more than the left leg. He had some minor complaints of pain in the right knee, requiring pain medications and started to get up OOB with therapy on day one. Hemovac drains were pulled without difficulty on day one.   POD 2 - Continued to work with therapy into day two.  Dressings were changed on day two and both incisions were healing well.  The epidural was removed without difficulty by Anesthesia on day two.   POD 3 - By day three, the patient started to show progress with therapy. Patient was seen in rounds by Dr. Lequita Halt. Complained of belly pain. No bowel movements. Patient was having problems with constipation and pain in the knees, requiring pain medications  Epidural came out day before. Problems with pain control day two. Due to nausea and poor pain control, was given IV Ofirmev on day two. Pain still moderate but a little better day three. KUB was performed. ABDOMEN - 1 VIEW Findings: There is extensive air throughout the slightly distended colon. There is no small bowel dilatation but there is air in the small bowel and stomach. No acute osseous abnormality. IMPRESSION: Ileus. POD 4 - Patient reported pain as mild and moderate. Patient seen in rounds for Dr. Lequita Halt. He state dthat he had a small movement last night with the enema and felt better temporarily. His belly was tight again that morning. He had some nausea last night also. No nausea at that time. Supp that am to help decompress and the enema again if needed. Repeat KUB was pending at that time. If developed nausea/vomiting, then NGT and GI consult. If  improved that morning, encourage mobility and repeat KUB in the AM. Plan was for CIR which will likely be Monday at that point.  Clinical Data: Follow-up postop ileus. Abdominal distention, pain. ABDOMEN - 1 VIEW Comparison: 12/16/2012 Findings: Diffuse gaseous distention of bowel, predominately colon. Findings are similar to prior study. No free air. No organomegaly or suspicious calcification. IMPRESSION: Continued ileus pattern, unchanged POD 5 - Patient reported pain as mild. Knee pain well controlled. Post-op ileus with continued abdominal pressure although did report flatus. Seen by Dr. Shelle Iron that AM. POD 6 - Patient was feeling somewhat better than day before. Positive flatus. He reported that he did have a small BM this morning. Denied chest pain and shortness of breath. No  issues overnight. Continues to have some mild abdominal discomfort. Plan was still to go Rehab after hospital stay. POD 7 - Patient seen in rounds with Dr. Lequita Halt. His knees were doing better, unfortunately, the abdomen continued to be a a problem. He had a small BM on Friday, and then again on Saturday. Sunday, he stated that he had two movements, one in the morning and again in the afternoon. He felt better following the BMs. He still felt tight in the belly despite the BMs. He has been getting up with therapy and walking, 70 feet on Saturday and 85 feet on Sunday. Despite a little progression with therapy and having movements, he did not look that much improved clinically. Rechecked labs and KUB again that morning. The patient stated that his IV started swelling over the weekend and the fluids were stopped. Restarted IV fluids. He got an episode of lightheadedness yesterday when transferring so checked orthostatic pressures that morning.  Clinical Data: Postop ileus. Compare to previous films. Distended abdomen. Bowel movement this morning. ABDOMEN - 1 VIEW Comparison: 12/17/2012 Findings: There is a small amount of  free intraperitoneal air. There is gaseous distension of large and small bowel loops, most consistent with ileus. Degenerative changes are seen in the spine. IMPRESSION: 1. Small amount of free intraperitoneal, likely residual postoperative gas. 2. Ileus, unchanged. POD 8 - Patient seen in rounds with Dr. Lequita Halt. He stated that he was doing better that day. Still had some nausea yesterday but belly was softer that day. Clinical Data: Ileus with nausea and vomiting. ABDOMEN - 1 VIEW Comparison: 12/20/2012 Findings: Gaseous dilatation of predominately the colon is relatively stable and may be minimally improved. There is no evidence of small bowel obstruction. No abnormal calcifications are identified. IMPRESSION: Stable to minimally improved colonic ileus. POD 9 - Patient seen in rounds for Dr. Lequita Halt. He felt MUCH better. Passing flatus more last night and moving bowels better. Belly had decompressed. Looked more comfortable. Ready for rehab.   Discharge Medications: Prior to Admission medications   Medication Sig Start Date End Date Taking? Authorizing Provider  atorvastatin (LIPITOR) 40 MG tablet Take 40 mg by mouth daily.    Yes Historical Provider, MD  diazepam (VALIUM) 5 MG tablet Take 5 mg by mouth every 6 (six) hours as needed (cramping).   Yes Historical Provider, MD  fluticasone (FLONASE) 50 MCG/ACT nasal spray Place 2 sprays into the nose daily as needed for rhinitis.   Yes Historical Provider, MD  latanoprost (XALATAN) 0.005 % ophthalmic solution Place 1 drop into both eyes at bedtime.   Yes Historical Provider, MD  lisinopril-hydrochlorothiazide (PRINZIDE,ZESTORETIC) 20-12.5 MG per tablet Take 1 tablet by mouth every morning.   Yes Historical Provider, MD  Naphazoline-Pheniramine (OPCON-A) 0.027-0.315 % SOLN Place 1 drop into both eyes 2 (two) times daily as needed (dry and itchy eyes).   Yes Historical Provider, MD  alum & mag hydroxide-simeth (MAALOX/MYLANTA) 200-200-20  MG/5ML suspension Take 30 mLs by mouth every 4 (four) hours as needed. 12/22/12   Alexzandrew Julien Girt, PA-C  bisacodyl (DULCOLAX) 10 MG suppository Place 1 suppository (10 mg total) rectally daily as needed. 12/22/12   Alexzandrew Julien Girt, PA-C  diphenhydrAMINE (BENADRYL) 25 mg capsule Take 1 capsule (25 mg total) by mouth every 4 (four) hours as needed for itching. 12/22/12   Alexzandrew Julien Girt, PA-C  docusate sodium 100 MG CAPS Take 100 mg by mouth 2 (two) times daily. 12/22/12   Alexzandrew Perkins, PA-C  HYDROmorphone (DILAUDID) 2 MG tablet Take  1-2 tablets (2-4 mg total) by mouth every 4 (four) hours as needed. 12/22/12   Alexzandrew Julien Girt, PA-C  methocarbamol (ROBAXIN) 500 MG tablet Take 1 tablet (500 mg total) by mouth every 6 (six) hours as needed. 12/22/12   Alexzandrew Julien Girt, PA-C  metoCLOPramide (REGLAN) 5 MG tablet Take 1-2 tablets (5-10 mg total) by mouth every 8 (eight) hours as needed (if ondansetron (ZOFRAN) ineffective.). 12/22/12   Alexzandrew Perkins, PA-C  ondansetron (ZOFRAN) 4 MG tablet Take 1 tablet (4 mg total) by mouth every 6 (six) hours as needed for nausea. 12/22/12   Alexzandrew Perkins, PA-C  polyethylene glycol (MIRALAX / GLYCOLAX) packet Take 17 g by mouth daily. 12/22/12   Alexzandrew Perkins, PA-C  sodium phosphate (FLEET) 7-19 GM/118ML ENEM Place 1 enema rectally daily as needed. 12/22/12   Alexzandrew Julien Girt, PA-C  warfarin (COUMADIN) 5 MG tablet Take 1 tablet (5 mg total) by mouth daily. Take Coumadin for four weeks total.  The dose may need to be adjusted based upon the INR.  Please follow the INR and titrate Coumadin dose for a therapeutic range between 2.0 and 3.0 INR.  After completing the four weeks of Coumadin, the patient may stop the Coumadin and then take an 81 mg Aspirin daily for four more weeks. 12/22/12   Alexzandrew Julien Girt, PA-C    Take Coumadin for 4 weeks and then discontinue.  The dose may need to be adjusted based upon the INR.  Please follow the INR  and titrate Coumadin dose for a therapeutic range between 2.0 and 3.0 INR.  After completing the 4 weeks of Coumadin, the patient may stop the Coumadin and resume their 81 mg Aspirin daily.  Continue Lovenox injections until the INR is therapeutic at or greater than 2.0.  When INR reaches the therapeutic level of equal to or greater than 2.0, the patient may discontinue the Lovenox injections.   Diet: Cardiac diet Activity:WBAT Follow-up:in 2 weeks or following discharge from rehab. Disposition - Rehab Discharged Condition: improving       Discharge Orders   Future Appointments Provider Department Dept Phone   12/31/2012 8:30 AM Agustin Cree, OT [facility]  [address]  [place] [phone]   12/31/2012 11:00 AM Jethro Bastos, PTA [facility]  [address]  [place] [phone]   12/31/2012 1:30 PM Agustin Cree, OT [facility]  [address]  [place] [phone]   12/31/2012 2:00 PM Claretta Fraise Medendorp, PT [facility]  [address]  [place] [phone]   Future Orders Complete By Expires     CPM  As directed     Comments:      Continuous passive motion machine (CPM):      Use the CPM from 0 to 45 degrees for 3-4 hours per day per leg.      You may increase by 5-10 degrees per day.  You may break it up into 2 or 3 sessions per day.      Use CPM for 1-2 more weeks or until you are released from Rehab.    Call MD / Call 911  As directed     Comments:      If you experience chest pain or shortness of breath, CALL 911 and be transported to the hospital emergency room.  If you develope a fever above 101 F, pus (white drainage) or increased drainage or redness at the wound, or calf pain, call your surgeon's office.  Change dressing  As directed     Comments:      Change dressing daily with sterile 4 x 4 inch gauze dressing and apply TED hose. Do not submerge the incision under water.    Constipation  Prevention  As directed     Comments:      Drink plenty of fluids.  Prune juice may be helpful.  You may use a stool softener, such as Colace (over the counter) 100 mg twice a day.  Use MiraLax (over the counter) for constipation as needed.    Diet - low sodium heart healthy  As directed     Discharge instructions  As directed     Comments:      Pick up stool softner and laxative for home. Do not submerge incision under water. May shower. Continue to use ice for pain and swelling from surgery.  Take Coumadin for four weeks total.  The dose may need to be adjusted based upon the INR.  Please follow the INR and titrate Coumadin dose for a therapeutic range between 2.0 and 3.0 INR.  After completing the four weeks of Coumadin, the patient may stop the Coumadin and then take an 81 mg Aspirin daily for four more weeks.    Do not put a pillow under the knee. Place it under the heel.  As directed     Do not sit on low chairs, stoools or toilet seats, as it may be difficult to get up from low surfaces  As directed     Driving restrictions  As directed     Comments:      No driving until released by the physician.    Increase activity slowly as tolerated  As directed     Lifting restrictions  As directed     Comments:      No lifting until released by the physician.    Patient may shower  As directed     Comments:      You may shower without a dressing once there is no drainage.  Do not wash over the wound.  If drainage remains, do not shower until drainage stops.    TED hose  As directed     Comments:      Use stockings (TED hose) for 3 weeks on both leg(s).  You may remove them at night for sleeping.    Weight bearing as tolerated  As directed         Medication List    STOP taking these medications       HYDROcodone-acetaminophen 5-325 MG per tablet  Commonly known as:  NORCO/VICODIN     ibuprofen 800 MG tablet  Commonly known as:  ADVIL,MOTRIN     oxyCODONE-acetaminophen 5-325 MG  per tablet  Commonly known as:  PERCOCET/ROXICET     Super B Complex Tabs      TAKE these medications       alum & mag hydroxide-simeth 200-200-20 MG/5ML suspension  Commonly known as:  MAALOX/MYLANTA  Take 30 mLs by mouth every 4 (four) hours as needed.     atorvastatin 40 MG tablet  Commonly known as:  LIPITOR  Take 40 mg by mouth daily.     bisacodyl 10 MG suppository  Commonly known as:  DULCOLAX  Place 1 suppository (10 mg total) rectally daily as needed.     diazepam 5 MG tablet  Commonly known as:  VALIUM  Take 5 mg by mouth every 6 (six) hours as  needed (cramping).     diphenhydrAMINE 25 mg capsule  Commonly known as:  BENADRYL  Take 1 capsule (25 mg total) by mouth every 4 (four) hours as needed for itching.     DSS 100 MG Caps  Take 100 mg by mouth 2 (two) times daily.     fluticasone 50 MCG/ACT nasal spray  Commonly known as:  FLONASE  Place 2 sprays into the nose daily as needed for rhinitis.     HYDROmorphone 2 MG tablet  Commonly known as:  DILAUDID  Take 1-2 tablets (2-4 mg total) by mouth every 4 (four) hours as needed.     latanoprost 0.005 % ophthalmic solution  Commonly known as:  XALATAN  Place 1 drop into both eyes at bedtime.     lisinopril-hydrochlorothiazide 20-12.5 MG per tablet  Commonly known as:  PRINZIDE,ZESTORETIC  Take 1 tablet by mouth every morning.     methocarbamol 500 MG tablet  Commonly known as:  ROBAXIN  Take 1 tablet (500 mg total) by mouth every 6 (six) hours as needed.     metoCLOPramide 5 MG tablet  Commonly known as:  REGLAN  Take 1-2 tablets (5-10 mg total) by mouth every 8 (eight) hours as needed (if ondansetron (ZOFRAN) ineffective.).     ondansetron 4 MG tablet  Commonly known as:  ZOFRAN  Take 1 tablet (4 mg total) by mouth every 6 (six) hours as needed for nausea.     OPCON-A 0.027-0.315 % Soln  Generic drug:  Naphazoline-Pheniramine  Place 1 drop into both eyes 2 (two) times daily as needed (dry and itchy  eyes).     polyethylene glycol packet  Commonly known as:  MIRALAX / GLYCOLAX  Take 17 g by mouth daily.     sodium phosphate 7-19 GM/118ML Enem  Place 1 enema rectally daily as needed.     warfarin 5 MG tablet  Commonly known as:  COUMADIN  Take 1 tablet (5 mg total) by mouth daily. Take Coumadin for four weeks total.  The dose may need to be adjusted based upon the INR.  Please follow the INR and titrate Coumadin dose for a therapeutic range between 2.0 and 3.0 INR.  After completing the four weeks of Coumadin, the patient may stop the Coumadin and then take an 81 mg Aspirin daily for four more weeks.       Follow-up Information   Follow up with Loanne Drilling, MD. Schedule an appointment as soon as possible for a visit in 2 weeks. (Call for appointment and time to follow release from Rehab.)    Contact information:   [address], [address] [place] [address] [phone]       Signed: Patrica Duel 12/30/2012, 10:31 PM

## 2012-12-22 NOTE — Progress Notes (Signed)
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  Sleep apnea      cpap - does not know settings   .  Arthritis     Past Surgical History   Procedure  Laterality  Date   .  Back surgery   1980s     L4-5      Functional Problem List  Patient demonstrates impairments in the following areas: Balance, Bowel, Endurance, Medication Management, Motor, Pain, Safety and Skin Integrity  Basic ADL's: bathing, dressing and toileting Advanced ADL's: simple meal preparation and light housekeeping  Transfers:  bed mobility, bed to chair, toilet, tub/shower, car and furniture Locomotion:  ambulation, wheelchair mobility and stairs  Additional Impairments:  Discharge Disposition  Anticipated Outcomes Item Anticipated Outcome  Eating/Swallowing    Basic self-care  Mod I  Tolieting  Min assist  Bowel/Bladder  Remain continent of bowel/bladder  Transfers  Mod I LRAD  Locomotion  150' Mod I with RW  Communication    Cognition    Pain  3 or less on scale 0-10  Safety/Judgment    Other  Healing of knees incision without infection while in rehab   Therapy Plan: PT Intensity: Minimum of 1-2 x/day ,45 to 90 minutes PT Frequency: 5 out of 7 days PT Duration Estimated Length of Stay: 10-14 days OT Intensity: Minimum of 1-2 x/day, 45 to 90 minutes OT Frequency: 5 out of 7 days OT Duration/Estimated Length of Stay: 10-14 days      Team Interventions: Item RN PT OT SLP SW TR Other  Self Care/Advanced ADL Retraining   x      Neuromuscular Re-Education  x x      Therapeutic Activities  x x      UE/LE Strength Training/ROM  x x      UE/LE Coordination Activities  x       Visual/Perceptual Remediation/Compensation         DME/Adaptive Equipment Instruction  x x      Therapeutic Exercise  x x      Balance/Vestibular Training  x x       Patient/Family Education x x x      Cognitive Remediation/Compensation         Functional Mobility Training  x x      Ambulation/Gait Training  x       Museum/gallery curator  x       Wheelchair Propulsion/Positioning  x       Functional Statistician  x       Community Reintegration  x x      Dysphagia/Aspiration Film/video editor         Bladder Management x        Bowel Management x        Disease Management/Prevention         Pain Management x x x      Medication Management x        Skin Care/Wound Management x        Splinting/Orthotics         Discharge Planning x x x      Psychosocial Support x x x  Team Discharge Planning: Destination: PT-Home ,OT- Home , SLP-  Projected Follow-up: PT-Outpatient PT;Home health PT, OT-  None, SLP-  Projected Equipment Needs: PT-Rolling walker with 5" wheels, OT- Tub/shower seat, SLP-  Patient/family involved in discharge planning: PT- Patient,  OT-Patient, SLP-   MD ELOS: 7 days Medical Rehab Prognosis:  Excellent Assessment: 62 yo male admitted for bilat TKR, had post op ileus, Now requiring 24/7 Rehab RN,MD, as well as CIR level PT, OT .  Treatment team will focus on ADLs and mobility as well as knee ROM and Strengtheningwith goals set at mod I   See Team Conference Notes for weekly updates to the plan of care

## 2012-12-22 NOTE — Progress Notes (Signed)
Discussed with Julien Girt, [place] and pt is medically ready to d/c to inpt rehab today. I will arrange. [phone]

## 2012-12-22 NOTE — Progress Notes (Signed)
   Subjective: 9 Days Post-Op Procedure(s) (LRB): TOTAL KNEE BILATERAL (Bilateral) Patient reports pain as mild.   Patient seen in rounds for Dr. Lequita Halt.  He feels MUCH better today.  Passing flatus more last night and moving bowels better.  Belly has decompressed.  Looks more comfortable.  Ready for rehab. Patient is well, and has had no acute complaints or problems Patient is ready to go CIR.  Objective: Vital signs in last 24 hours: Temp:  [99.2 F (37.3 C)-99.8 F (37.7 C)] 99.8 F (37.7 C) (06/25 0547) Pulse Rate:  [96-98] 98 (06/25 0547) Resp:  [16-18] 16 (06/25 0547) BP: (133-149)/(67-91) 133/67 mmHg (06/25 0547) SpO2:  [97 %-98 %] 97 % (06/25 0547)  Intake/Output from previous day:  Intake/Output Summary (Last 24 hours) at 12/22/12 0919 Last data filed at 12/22/12 0602  Gross per 24 hour  Intake 2676.66 ml  Output   2525 ml  Net 151.66 ml    Intake/Output this shift:    Labs:  Recent Labs  12/20/12 0728 12/21/12 0402 12/22/12 0405  HGB 8.0* 7.8* 7.4*    Recent Labs  12/21/12 0402 12/22/12 0405  WBC 8.4 9.8  RBC 2.84* 2.78*  HCT 23.7* 23.4*  PLT 301 334    Recent Labs  12/21/12 0402 12/22/12 0405  NA 134* 135  K 3.1* 3.5  CL 102 105  CO2 26 28  BUN 8 8  CREATININE 0.74 0.87  GLUCOSE 117* 99  CALCIUM 9.5 9.7    Recent Labs  12/21/12 0402 12/22/12 0405  INR 2.34* 2.52*    EXAM: General - Patient is Alert, Appropriate and Oriented Extremity - Neurovascular intact Sensation intact distally Dorsiflexion/Plantar flexion intact No cellulitis present Incision - clean, dry, no drainage, healing to both knees Motor Function - intact, moving feet and toes well on exam.  Abdomen - much softer today, nontender.  Assessment/Plan: 9 Days Post-Op Procedure(s) (LRB): TOTAL KNEE BILATERAL (Bilateral) Procedure(s) (LRB): TOTAL KNEE BILATERAL (Bilateral) Past Medical History  Diagnosis Date  . Hypertension   . Sleep apnea     cpap - does  not know settings   . Arthritis    Principal Problem:   OA (osteoarthritis) of knee Active Problems:   Ileus, postoperative   Postoperative anemia due to acute blood loss   Hyponatremia  Estimated body mass index is 32.99 kg/(m^2) as calculated from the following:   Height as of this encounter: 6\' 1"  (1.854 m).   Weight as of this encounter: 113.399 kg (250 lb). Advance diet Up with therapy Discharge to CIR Diet - Cardiac diet Follow up - in 1 weeks Activity - WBAT Disposition - Rehab Condition Upon Discharge - Improved D/C Meds - See DC Summary DVT Prophylaxis - Coumadin  Take Coumadin for three weeks and then discontinue.  The dose may need to be adjusted based upon the INR.  Please follow the INR and titrate Coumadin dose for a therapeutic range between 2.0 and 3.0 INR.  After completing the three weeks of Coumadin, the patient may stop the Coumadin and then take an 81 mg Aspirin daily for four more weeks.   Kapil Petropoulos 12/22/2012, 9:19 AM

## 2012-12-22 NOTE — H&P (Signed)
Physical Medicine and Rehabilitation Admission H&P  No chief complaint on file.  :  HPI: Kent Ryan is a 62 y.o. right-handed male admitted 12/12/2012 with end-stage degenerative changes of both knees. No change with conservative care. Patient independent prior to admission. Underwent bilateral total knee replacements 12/13/2012 per Dr. Despina Hick. Placed on Coumadin for DVT prophylaxis and advised weightbearing as tolerated. Postoperative pain management with epidural removed 12/15/2012. Acute blood loss anemia with latest hemoglobin 7.4 and monitored. Bouts of nausea vomiting with abdominal films 12/16/2012 showing ileus and placed on Reglan when necessary and repeat abdominal films 12/21/2012 with minimal improvement of colonic ileus . Patient's bowels have been regulated. Diet has been advanced to regular with no more nausea or vomiting. Physical and occupational therapy evaluations completed 12/14/2012 with recommendations for physical medicine rehabilitation consult to consider inpatient rehabilitation services. Patient was felt to be a good candidate for inpatient rehabilitation services and was admitted for comprehensive rehabilitation program.    Review of Systems  Gastrointestinal: Positive for constipation.  Musculoskeletal: Positive for myalgias, back pain and joint pain.  All other systems reviewed and are negative  Past Medical History   Diagnosis  Date   .  Hypertension    .  Sleep apnea      cpap - does not know settings   .  Arthritis     Past Surgical History   Procedure  Laterality  Date   .  Back surgery   1980s     L4-5   .  Torn cartilage- bilateral knees surgery     .  Total knee arthroplasty  Bilateral  12/13/2012     Procedure: TOTAL KNEE BILATERAL; Surgeon: Loanne Drilling, MD; Location: [facility] ORS; Service: Orthopedics; Laterality: Bilateral; Right Knee first    History reviewed. No pertinent family history.  Social History: reports that he has quit smoking. He has  never used smokeless tobacco. He reports that drinks alcohol. He reports that he does not use illicit drugs.  Allergies: No Known Allergies  Medications Prior to Admission   Medication  Sig  Dispense  Refill   .  atorvastatin (LIPITOR) 40 MG tablet  Take 40 mg by mouth daily.     .  B Complex-C (SUPER B COMPLEX) TABS  Take 1 tablet by mouth daily.     .  diazepam (VALIUM) 5 MG tablet  Take 5 mg by mouth every 6 (six) hours as needed (cramping).     .  fluticasone (FLONASE) 50 MCG/ACT nasal spray  Place 2 sprays into the nose daily as needed for rhinitis.     Marland Kitchen  ibuprofen (ADVIL,MOTRIN) 800 MG tablet  Take 800 mg by mouth every 8 (eight) hours as needed for pain.     Marland Kitchen  latanoprost (XALATAN) 0.005 % ophthalmic solution  Place 1 drop into both eyes at bedtime.     Marland Kitchen  lisinopril-hydrochlorothiazide (PRINZIDE,ZESTORETIC) 20-12.5 MG per tablet  Take 1 tablet by mouth every morning.     .  Naphazoline-Pheniramine (OPCON-A) 0.027-0.315 % SOLN  Place 1 drop into both eyes 2 (two) times daily as needed (dry and itchy eyes).     Marland Kitchen  oxyCODONE-acetaminophen (PERCOCET/ROXICET) 5-325 MG per tablet  Take 1 tablet by mouth every 4 (four) hours as needed for pain.     Marland Kitchen  HYDROcodone-acetaminophen (NORCO/VICODIN) 5-325 MG per tablet  Take 1 tablet by mouth every 6 (six) hours as needed for pain.      Home:  Home Living  Lives  With: Spouse  Available Help at Discharge: Family  Type of Home: House  Home Adaptive Equipment: Straight cane  Additional Comments: Pt planning on going to rehab following hospital stay  Functional History:  Prior Function  Able to Take Stairs?: Yes  Driving: No  Functional Status:  Mobility:  Bed Mobility  Bed Mobility: Sit to Supine  Supine to Sit: 1: +2 Total assist;With rails;HOB elevated  Supine to Sit: Patient Percentage: 50%  Sit to Supine: 1: +2 Total assist  Sit to Supine: Patient Percentage: 20%  Transfers  Transfers: Sit to Stand;Stand to Sit  Sit to Stand: 1: +2  Total assist;From bed;From chair/3-in-1  Sit to Stand: Patient Percentage: 10%  Stand to Sit: 1: +2 Total assist;With upper extremity assist;With armrests;To bed  Stand to Sit: Patient Percentage: 20%  Stand Pivot Transfers: 1: +2 Total assist  Stand Pivot Transfers: Patient Percentage: 40%  Ambulation/Gait  Ambulation/Gait Assistance: 1: +2 Total assist  Ambulation Distance (Feet): 3 Feet  Assistive device: Rolling walker  Ambulation/Gait Assistance Details: increased difficulty weight shifting and increased time ti pivot 1/4 from recliner to bed with 50% VC's on safety.  Gait Pattern: Step-to pattern;Wide base of support;Decreased step length - right;Decreased step length - left;Shuffle  Gait velocity: decreased  Stairs: No  Wheelchair Mobility  Wheelchair Mobility: No  ADL:  ADL  Grooming: Set up  Where Assessed - Grooming: Supported sitting  Upper Body Bathing: Supervision/safety (guarding assist with lines)  Where Assessed - Upper Body Bathing: Supported sitting  Lower Body Bathing: +2 Total assistance  Where Assessed - Lower Body Bathing: Supported sit to stand  Upper Body Dressing: Minimal assistance  Where Assessed - Upper Body Dressing: Unsupported sitting  Lower Body Dressing: +2 Total assistance  Where Assessed - Lower Body Dressing: Supported sit to Scientist, research (life sciences): Simulated;+2 Total assistance  Toilet Transfer Method: Archivist: (chair to bed)  Equipment Used: Rolling walker  Transfers/Ambulation Related to ADLs: Pt performed spt to bed  ADL Comments: educated on AE but did not demonstrate today. Pt uncomfortable and feeling nauseaus. Will continue education.  Cognition:  Cognition  Overall Cognitive Status: Within Functional Limits for tasks assessed  Arousal/Alertness: Awake/alert  Orientation Level: Oriented X4  Cognition  Arousal/Alertness: Awake/alert  Behavior During Therapy: WFL for tasks assessed/performed  Overall  Cognitive Status: Within Functional Limits for tasks assessed  Physical Exam:  Blood pressure 156/79, pulse 108, temperature 98.1 F (36.7 C), temperature source Oral, resp. rate 20, height 6\' 1"  (1.854 m), weight 113.399 kg (250 lb), SpO2 94.00%.  Physical Exam  Vitals reviewed.  Constitutional: He is oriented to person, place, and time. He appears well-developed.  HENT:  Head: Normocephalic.  Eyes: EOM are normal.  Neck: Normal range of motion. Neck supple. No thyromegaly present.  Cardiovascular: Normal rate and regular rhythm.  Pulmonary/Chest: Breath sounds normal. No respiratory distress.  Abdominal: Soft. Bowel sounds are hyperactive. Decreased distention. No pain Musculoskeletal:  Both knees dressed, no drainage, decreased swelling, appropriately tender.  Neurological: He is alert and oriented to person, place, and time.  UE 5/5 LE 1+ to 2/5 HF, 1+ to 2 KE, 4/5 ankles. No sensory abnl  Skin:  Bilateral total knee replacements with dressing in place  Psychiatric: He has a normal mood and affect  Results for orders placed during the hospital encounter of 12/13/12 (from the past 48 hour(s))   CBC Status: Abnormal    Collection Time    12/15/12 4:50 AM  Result  Value  Range    WBC  18.1 (*)  4.0 - 10.5 K/uL    RBC  4.07 (*)  4.22 - 5.81 MIL/uL    Hemoglobin  11.0 (*)  13.0 - 17.0 g/dL    HCT  98.1 (*)  19.1 - 52.0 %    MCV  84.0  78.0 - 100.0 fL    MCH  27.0  26.0 - 34.0 pg    MCHC  32.2  30.0 - 36.0 g/dL    RDW  47.8  29.5 - 62.1 %    Platelets  202  150 - 400 K/uL   BASIC METABOLIC PANEL Status: Abnormal    Collection Time    12/15/12 4:50 AM   Result  Value  Range    Sodium  135  135 - 145 mEq/L    Potassium  4.4  3.5 - 5.1 mEq/L    Chloride  102  96 - 112 mEq/L    CO2  26  19 - 32 mEq/L    Glucose, Bld  142 (*)  70 - 99 mg/dL    BUN  11  6 - 23 mg/dL    Creatinine, Ser  3.08  0.50 - 1.35 mg/dL    Calcium  65.7  8.4 - 10.5 mg/dL    GFR calc non Af Amer  88 (*)   >90 mL/min    GFR calc Af Amer  >90  >90 mL/min    Comment:      The eGFR has been calculated     using the CKD EPI equation.     This calculation has not been     validated in all clinical     situations.     eGFR's persistently     <90 mL/min signify     possible Chronic Kidney Disease.   PROTIME-INR Status: Abnormal    Collection Time    12/15/12 4:50 AM   Result  Value  Range    Prothrombin Time  16.1 (*)  11.6 - 15.2 seconds    INR  1.32  0.00 - 1.49   CBC Status: Abnormal    Collection Time    12/16/12 5:50 AM   Result  Value  Range    WBC  15.4 (*)  4.0 - 10.5 K/uL    RBC  3.46 (*)  4.22 - 5.81 MIL/uL    Hemoglobin  9.5 (*)  13.0 - 17.0 g/dL    HCT  84.6 (*)  96.2 - 52.0 %    MCV  83.5  78.0 - 100.0 fL    MCH  27.5  26.0 - 34.0 pg    MCHC  32.9  30.0 - 36.0 g/dL    RDW  95.2  84.1 - 32.4 %    Platelets  191  150 - 400 K/uL   PROTIME-INR Status: None    Collection Time    12/16/12 5:50 AM   Result  Value  Range    Prothrombin Time  14.4  11.6 - 15.2 seconds    INR  1.14  0.00 - 1.49   BASIC METABOLIC PANEL Status: Abnormal    Collection Time    12/16/12 5:50 AM   Result  Value  Range    Sodium  135  135 - 145 mEq/L    Potassium  4.5  3.5 - 5.1 mEq/L    Chloride  102  96 - 112 mEq/L    CO2  26  19 -  32 mEq/L    Glucose, Bld  125 (*)  70 - 99 mg/dL    BUN  14  6 - 23 mg/dL    Creatinine, Ser  2.95  0.50 - 1.35 mg/dL    Calcium  62.1  8.4 - 10.5 mg/dL    GFR calc non Af Amer  89 (*)  >90 mL/min    GFR calc Af Amer  >90  >90 mL/min    Comment:      The eGFR has been calculated     using the CKD EPI equation.     This calculation has not been     validated in all clinical     situations.     eGFR's persistently     <90 mL/min signify     possible Chronic Kidney Disease.    No results found.  Post Admission Physician Evaluation:  1. Functional deficits secondary to OA of bilateral knees s/p bilateral TKA's. 2. Patient is admitted to receive  collaborative, interdisciplinary care between the physiatrist, rehab nursing staff, and therapy team. 3. Patient's level of medical complexity and substantial therapy needs in context of that medical necessity cannot be provided at a lesser intensity of care such as a SNF. 4. Patient has experienced substantial functional loss from his/her baseline which was documented above under the "Functional History" and "Functional Status" headings. Judging by the patient's diagnosis, physical exam, and functional history, the patient has potential for functional progress which will result in measurable gains while on inpatient rehab. These gains will be of substantial and practical use upon discharge in facilitating mobility and self-care at the household level. 5. Physiatrist will provide 24 hour management of medical needs as well as oversight of the therapy plan/treatment and provide guidance as appropriate regarding the interaction of the two. 6. 24 hour rehab nursing will assist with bladder management, bowel management, safety, skin/wound care, disease management, medication administration, pain management and patient education and help integrate therapy concepts, techniques,education, etc. 7. PT will assess and treat for/with: Lower extremity strength, range of motion, stamina, balance, functional mobility, safety, adaptive techniques and equipment, pain mgt, knee ROM, education. Goals are: mod I. 8. OT will assess and treat for/with: ADL's, functional mobility, safety, upper extremity strength, adaptive techniques and equipment, knee ROM, pain mgt. Goals are: mod I. 9. SLP will assess and treat for/with: n/a. Goals are: n/a. 10. Case Management and Social Worker will assess and treat for psychological issues and discharge planning. 11. Team conference will be held weekly to assess progress toward goals and to determine barriers to discharge. 12. Patient will receive at least 3 hours of therapy per day at  least 5 days per week. 13. ELOS: 7-10 days Prognosis: excellent   Medical Problem List and Plan:  1. 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 DVT Prophylaxis/Anticoagulation: Coumadin for DVT prophylaxis. Monitor for any bleeding episodes Check vascular study  3. Pain Management: Robaxin and Dilaudid as needed.  4. Neuropsych: This patient is capable of making decisions on his own behalf.  5. Acute blood loss anemia. Followup CBC/transfuse if symptomatic  6.Ileus. Hold reglan, establish softener, laxative program once stooling decreases 7. Obstructive sleep apnea. Continue CPAP           Ranelle Oyster, MD, [facility] Physical Medicine & Rehabilitation

## 2012-12-22 NOTE — Progress Notes (Addendum)
ANTICOAGULATION CONSULT NOTE - Follow Up Consult  Pharmacy Consult for coumadin Indication: VTE prophylaxis  No Known Allergies  Patient Measurements: Height: 6\' 1"  (185.4 cm) Weight: 255 lb 1.6 oz (115.713 kg) IBW/kg (Calculated) : 79.9 Heparin Dosing Weight:   Vital Signs: Temp: 99.7 F (37.6 C) (06/25 1300) Temp src: Oral (06/25 1300) BP: 122/70 mmHg (06/25 1300) Pulse Rate: 105 (06/25 1300)  Labs:  Recent Labs  12/20/12 0455  12/20/12 0728 12/21/12 0402 12/22/12 0405  HGB  --   < > 8.0* 7.8* 7.4*  HCT  --   --  24.0* 23.7* 23.4*  PLT  --   --  287 301 334  LABPROT 19.9*  --   --  24.6* 26.0*  INR 1.76*  --   --  2.34* 2.52*  CREATININE  --   --  0.79 0.74 0.87  < > = values in this interval not displayed.  Estimated Creatinine Clearance: 117.3 ml/min (by C-G formula based on Cr of 0.87).   Medications:  Scheduled:  . atorvastatin  40 mg Oral Daily  . latanoprost  1 drop Both Eyes QHS  . [START ON 12/23/2012] polyethylene glycol  17 g Oral Daily  . senna-docusate  2 tablet Oral BID  . warfarin  5 mg Oral ONCE-1800  . Warfarin - Pharmacist Dosing Inpatient   Does not apply q1800   Infusions:    Assessment: 62yo M s/p bilat TKA 6/16 will be continued on coumadin therapy. Patient was transferred from [facility]. Pharmacy was asked to start warfarin on 6/17.  Patient was on continuous epidural infusion postop. Epidural was removed (without complication, tip intact) 6/18 at 3:15pm.  Lovenox 30mg  SQ BID was started 6/19 at 0645 and now d/c'ed. Low dose warfarin (2.5mg ) given 6/17 due to epidural. Warfarin dose 6/18 was ordered but not given. Warfarin 5mg  given 6/19 - 6/24, INR = 2.52, in therapeutic range  CBC: Hgb low, stable. Plts ok. No bleeding documented.  Per [facility], patient was educated coumadin on 12/16/12    Goal of Therapy:  INR 2-3 Monitor platelets by anticoagulation protocol: Yes   Plan:  1) Continue coumadin 5mg  po x1 2) Daily PT/INR 3) f/u resume of  home BP meds   Mathea Frieling, [name] 12/22/2012,1:31 PM

## 2012-12-23 ENCOUNTER — Inpatient Hospital Stay (HOSPITAL_COMMUNITY): Payer: BC Managed Care – PPO | Admitting: Occupational Therapy

## 2012-12-23 ENCOUNTER — Inpatient Hospital Stay (HOSPITAL_COMMUNITY): Payer: BC Managed Care – PPO | Admitting: Physical Therapy

## 2012-12-23 ENCOUNTER — Inpatient Hospital Stay (HOSPITAL_COMMUNITY): Payer: BC Managed Care – PPO | Admitting: *Deleted

## 2012-12-23 DIAGNOSIS — Z96659 Presence of unspecified artificial knee joint: Secondary | ICD-10-CM

## 2012-12-23 DIAGNOSIS — M1710 Unilateral primary osteoarthritis, unspecified knee: Secondary | ICD-10-CM

## 2012-12-23 LAB — COMPREHENSIVE METABOLIC PANEL
ALT: 90 U/L — ABNORMAL HIGH (ref 0–53)
AST: 57 U/L — ABNORMAL HIGH (ref 0–37)
Albumin: 2.1 g/dL — ABNORMAL LOW (ref 3.5–5.2)
Calcium: 9.6 mg/dL (ref 8.4–10.5)
Chloride: 104 mEq/L (ref 96–112)
Creatinine, Ser: 0.9 mg/dL (ref 0.50–1.35)
Sodium: 137 mEq/L (ref 135–145)

## 2012-12-23 LAB — CBC WITH DIFFERENTIAL/PLATELET
Eosinophils Absolute: 0.3 10*3/uL (ref 0.0–0.7)
Eosinophils Relative: 3 % (ref 0–5)
Hemoglobin: 8 g/dL — ABNORMAL LOW (ref 13.0–17.0)
Lymphocytes Relative: 13 % (ref 12–46)
MCH: 27.5 pg (ref 26.0–34.0)
Monocytes Absolute: 0.9 10*3/uL (ref 0.1–1.0)
Neutrophils Relative %: 76 % (ref 43–77)
Platelets: 400 10*3/uL (ref 150–400)
RBC: 2.91 MIL/uL — ABNORMAL LOW (ref 4.22–5.81)
WBC: 11.1 10*3/uL — ABNORMAL HIGH (ref 4.0–10.5)

## 2012-12-23 LAB — PROTIME-INR: INR: 2.05 — ABNORMAL HIGH (ref 0.00–1.49)

## 2012-12-23 MED ORDER — ENSURE COMPLETE PO LIQD
237.0000 mL | Freq: Two times a day (BID) | ORAL | Status: DC
  Administered 2012-12-23 – 2013-01-06 (×22): 237 mL via ORAL

## 2012-12-23 MED ORDER — WARFARIN SODIUM 5 MG PO TABS
5.0000 mg | ORAL_TABLET | Freq: Once | ORAL | Status: AC
  Administered 2012-12-23: 5 mg via ORAL
  Filled 2012-12-23: qty 1

## 2012-12-23 MED ORDER — POTASSIUM CHLORIDE CRYS ER 20 MEQ PO TBCR
20.0000 meq | EXTENDED_RELEASE_TABLET | Freq: Two times a day (BID) | ORAL | Status: AC
  Administered 2012-12-23 (×2): 20 meq via ORAL
  Filled 2012-12-23 (×2): qty 1

## 2012-12-23 NOTE — Care Management Note (Signed)
[facility] Individual Statement of Services  Patient Name:  Kent Ryan  Date:  12/23/2012  Welcome to the [facility].  Our goal is to provide you with an individualized program based on your diagnosis and situation, designed to meet your specific needs.  With this comprehensive rehabilitation program, you will be expected to participate in at least 3 hours of rehabilitation therapies Monday-Friday, with modified therapy programming on the weekends.  Your rehabilitation program will include the following services:  Physical Therapy (PT), Occupational Therapy (OT), 24 hour per day rehabilitation nursing, Case Management ( Social Worker), Rehabilitation Medicine, Nutrition Services and Pharmacy Services  Weekly team conferences will be held on Wendesday to discuss your progress.  Your Social Worker will talk with you frequently to get your input and to update you on team discussions.  Team conferences with you and your family in attendance may also be held.  Expected length of stay: 10-12 days  Overall anticipated outcome: mod/i level  Depending on your progress and recovery, your program may change. Your Social Worker will coordinate services and will keep you informed of any changes. Your Child psychotherapist names and contact numbers are listed  below.  The following services may also be recommended but are not provided by the [facility]:   Driving Evaluations  Home Health Rehabiltiation Services  Outpatient Rehabilitatation [facility]  Vocational Rehabilitation   Arrangements will be made to provide these services after discharge if needed.  Arrangements include referral to agencies that provide these services.  Your insurance has been verified to be:  [employer] Your primary doctor is:  Dr Marlan Palau  Pertinent information will be shared with your doctor and your insurance company.  Social Worker:  Dossie Der, [place]  [phone]  Information discussed with and copy given to patient by: Lucy Chris, 12/23/2012, 10:49 AM

## 2012-12-23 NOTE — Progress Notes (Signed)
Social Work Assessment and Plan Social Work Assessment and Plan  Patient Details  Name: Kent Ryan MRN: [id number] Date of Birth: 12-Feb-1951  Today's Date: 12/23/2012  Problem List:  Patient Active Problem List   Diagnosis Date Noted  . Status post bilateral knee replacements 12/22/2012  . Ileus, postoperative 12/16/2012  . Postoperative anemia due to acute blood loss 12/16/2012  . Hyponatremia 12/16/2012  . OA (osteoarthritis) of knee 12/13/2012   Past Medical History:  Past Medical History  Diagnosis Date  . Hypertension   . Sleep apnea     cpap - does not know settings   . Arthritis    Past Surgical History:  Past Surgical History  Procedure Laterality Date  . Back surgery  1980s    L4-5   . Torn cartilage- bilateral knees surgery     . Total knee arthroplasty Bilateral 12/13/2012    Procedure: TOTAL KNEE BILATERAL;  Surgeon: Loanne Drilling, MD;  Location: [facility];  Service: Orthopedics;  Laterality: Bilateral;  Right Knee first   Social History:  reports that he has quit smoking. He has never used smokeless tobacco. He reports that  drinks alcohol. He reports that he does not use illicit drugs.  Family / Support Systems Marital Status: Married Patient Roles: Spouse;Parent;Caregiver Spouse/Significant Other: [phone]-home Children: Taelon-son [phone] Other Supports: Pt's sister';s Anticipated Caregiver: Self Ability/Limitations of Caregiver: Pt is the caregiver for his wife, currently hs sister;'s are assistin her at home while he is here.  Will need to conitnue once pt returns home. Caregiver Availability: Other (Comment) (Sister's to assist wife at home not 24 hr) Family Dynamics: Close knit family who try to assist with what they can.  Difficulty is everybody works and can only do so much.  Pt states; '"We do the best we can."  Social History Preferred language: English Religion: Baptist Cultural Background: No issues Education: [employer] Read: Yes Write: Yes Employment Status: Employed Name of Employer: [employer] Return to Work Plans: Will try to resume once healed Legal Hisotry/Current Legal Issues: No issues Guardian/Conservator: None-according to MD pt is capable of making his own decisions   Abuse/Neglect Physical Abuse: Denies Verbal Abuse: Denies Sexual Abuse: Denies Exploitation of patient/patient's resources: Denies Self-Neglect: Denies  Emotional Status Pt's affect, behavior adn adjustment status: Pt is motivated to get better and become independent again.  He states: " Let's get on with it."  He is working in his bed exercising and lifting his legs.  He is optimistic and positive regarding his recovery.   Recent Psychosocial Issues: Caregiving issues for his wife-patching together care Pyschiatric History: No issues-deferred depression screen due felt not needed at this time.  Pt is very upbeat and shows no signs of depression.  WIll continue to monitor his coping while here. Substance Abuse History: No issues  Patient / Family Perceptions, Expectations & Goals Pt/Family understanding of illness & functional limitations: Pt has a good understanding of his surgery and condition.  He realizes it will be painful but is prepared for this and plans to work through it.  He is ready to work hard in his therapies. Premorbid pt/family roles/activities: Husband, Father, Employee, Home owner, brotherer, etc Anticipated changes in roles/activities/participation: resume Pt/family expectations/goals: Pt states: " I need to be completely independent befor eI leave here, I have no help."  " I will do whatever it takes to get independent."  [employer]: None Premorbid Home Care/DME Agencies: None Transportation available at discharge: Family Resource referrals recommended:  Support group (specify) (Caregiver Support group)  Discharge Planning Living Arrangements: Spouse/significant  other Support Systems: Spouse/significant other;Children;Other relatives;Friends/neighbors Type of Residence: Private residence Insurance Resources: Media planner (specify) Herbalist) Financial Resources: Employment;Family Support Financial Screen Referred: No Living Expenses: Lives with family Money Management: Patient Do you have any problems obtaining your medications?: No Home Management: Patient Patient/Family Preliminary Plans: Return home with wife and his sister's assisting her.  He has been told it would be 6-8 weeks before he can assist wife for his own healing process.  He realizes he needs to still have sister's and others helping her.   Social Work Anticipated Follow Up Needs: HH/OP;Support Group  Clinical Impression Very pleasant gentleman who is extremely motivated to improve and become completely independent prior to discharge.  He is the primary caregiver of his wife who is wheelchair bound and this  Is a stressor for him worrying about her and making sure her needs are met while he is here.  Pt should reach mod/i prior to discharge.  Will see if  other community resources to assist them or wife at home. Work on the discharge plan and provide support while here.  Lucy Chris 12/23/2012, 12:02 PM

## 2012-12-23 NOTE — Progress Notes (Signed)
Occupational Therapy Session Note  Patient Details  Name: Kent Ryan MRN: [id number] Date of Birth: [birth year]  Today's Date: 12/23/2012 Time: 2536-6440 Time Calculation (min): 45 min  Short Term Goals: Week 1:  OT Short Term Goal 1 (Week 1): Pt will complete stand pivot transfer to toilet with min assist with necessary AD OT Short Term Goal 2 (Week 1): Pt will complete LB bathing with min assist with necessary AE OT Short Term Goal 3 (Week 1): Pt will complete LB dressing wiht min assist with necessary AE OT Short Term Goal 4 (Week 1): Pt will complete shower transfer with min assist with necessary AD OT Short Term Goal 5 (Week 1): Pt will tolerate standing for 5 mins to complete grooming tasks  Skilled Therapeutic Interventions/Progress Updates:    Pt seen for 1:1 OT with focus on functional mobility, sit <> stand, transfers, and education on AE to assist with LB bathing and dressing.  Educated pt on use of reacher for doffing socks and donning pants.  Pt reports understanding and plans to practice with equipment during next bathing and dressing session.  Pt reported needing to toilet.  Engaged in sit to stand from recliner with 3 attempts prior to complete sit to stand, tactile and verbal cues provided to increase forward weight shift and transition from support on recliner to RW.  Once standing pt min assist ambulation to toilet and min assist transfer.  Provided pt with heavy duty BSC to provide more surface area/support when seated on toilet.  Pt returned to recliner with ice placed on knees and all necessary items in reach.  Therapy Documentation Precautions:  Precautions Precautions: Knee;Fall Precaution Comments: No KI on L LE, able to perform SLR with <10 degree lag on 12/23/12 Required Braces or Orthoses: Knee Immobilizer - Right Knee Immobilizer - Right: Discontinue once straight leg raise with < 10 degree lag Knee Immobilizer - Left: Discontinue once straight leg raise with <  10 degree lag Restrictions Weight Bearing Restrictions: No Other Position/Activity Restrictions: WBAT bilaterally General:   Vital Signs: Therapy Vitals Temp: 99.2 F (37.3 C) Temp src: Oral BP: 169/87 mmHg Pain: Pain Assessment Pain Assessment: 0-10 Pain Score: 10-Worst pain ever Pain Type: Acute pain;Surgical pain Pain Location: Knee Pain Orientation: Right;Left Pain Descriptors / Indicators: Aching;Stabbing Pain Frequency: Constant Pain Onset: Progressive Patients Stated Pain Goal: 4 Pain Intervention(s): Repositioned;Cold applied;Distraction;RN made aware;Relaxation Multiple Pain Sites: No  See FIM for current functional status  Therapy/Group: Individual Therapy  Leonette Monarch 12/23/2012, 2:54 PM

## 2012-12-23 NOTE — Progress Notes (Signed)
Patient ID: Kent Ryan, male   DOB: 23-Mar-1951, 62 y.o.   MRN: [id number]  Subjective/Complaints: 62 y.o. right-handed male admitted 12/12/2012 with end-stage degenerative changes of both knees. No change with conservative care. Patient independent prior to admission. Underwent bilateral total knee replacements 12/13/2012 per Dr. Despina Hick. Placed on Coumadin for DVT prophylaxis and advised weightbearing as tolerated. Postoperative pain management with epidural removed 12/15/2012. Acute blood loss anemia with latest hemoglobin 7.4 and monitored. Bouts of nausea vomiting with abdominal films 12/16/2012 showing ileus and placed on Reglan when necessary and repeat abdominal films 12/21/2012 with minimal improvement of colonic ileus . Patient's bowels have been regulated. Diet has been advanced to regular with no more nausea or vomiting  Knee pain ok Did not use CPM yesterday, last reading 65 degrees   Review of Systems  Gastrointestinal: Positive for diarrhea.  Musculoskeletal: Positive for joint pain.  Psychiatric/Behavioral: The patient does not have insomnia.   All other systems reviewed and are negative.    Objective: Vital Signs: Blood pressure 120/75, pulse 77, temperature 98.8 F (37.1 C), temperature source Oral, resp. rate 18, height 6\' 1"  (1.854 m), weight 115.713 kg (255 lb 1.6 oz), SpO2 99.00%. Dg Abd 1 View  12/21/2012   *RADIOLOGY REPORT*  Clinical Data: Ileus with nausea and vomiting.  ABDOMEN - 1 VIEW  Comparison: 12/20/2012  Findings: Gaseous dilatation of predominately the colon is relatively stable and may be minimally improved.  There is no evidence of small bowel obstruction.  No abnormal calcifications are identified.  IMPRESSION: Stable to minimally improved colonic ileus.   Original Report Authenticated By: Irish Lack, M.D.   Results for orders placed during the hospital encounter of 12/22/12 (from the past 72 hour(s))  CBC WITH DIFFERENTIAL     Status: Abnormal   Collection Time    12/23/12  5:30 AM      Result Value Range   WBC 11.1 (*) 4.0 - 10.5 K/uL   RBC 2.91 (*) 4.22 - 5.81 MIL/uL   Hemoglobin 8.0 (*) 13.0 - 17.0 g/dL   HCT 40.9 (*) 81.1 - 91.4 %   MCV 84.5  78.0 - 100.0 fL   MCH 27.5  26.0 - 34.0 pg   MCHC 32.5  30.0 - 36.0 g/dL   RDW 78.2  95.6 - 21.3 %   Platelets 400  150 - 400 K/uL   Neutrophils Relative % 76  43 - 77 %   Lymphocytes Relative 13  12 - 46 %   Monocytes Relative 8  3 - 12 %   Eosinophils Relative 3  0 - 5 %   Basophils Relative 0  0 - 1 %   Neutro Abs 8.5 (*) 1.7 - 7.7 K/uL   Lymphs Abs 1.4  0.7 - 4.0 K/uL   Monocytes Absolute 0.9  0.1 - 1.0 K/uL   Eosinophils Absolute 0.3  0.0 - 0.7 K/uL   Basophils Absolute 0.0  0.0 - 0.1 K/uL   RBC Morphology POLYCHROMASIA PRESENT     Comment: RARE NRBCs   WBC Morphology MILD LEFT SHIFT (1-5% METAS, OCC MYELO, OCC BANDS)    COMPREHENSIVE METABOLIC PANEL     Status: Abnormal   Collection Time    12/23/12  5:30 AM      Result Value Range   Sodium 137  135 - 145 mEq/L   Potassium 3.4 (*) 3.5 - 5.1 mEq/L   Chloride 104  96 - 112 mEq/L   CO2 27  19 - 32 mEq/L  Glucose, Bld 96  70 - 99 mg/dL   BUN 9  6 - 23 mg/dL   Creatinine, Ser 0.45  0.50 - 1.35 mg/dL   Calcium 9.6  8.4 - 40.9 mg/dL   Total Protein 5.3 (*) 6.0 - 8.3 g/dL   Albumin 2.1 (*) 3.5 - 5.2 g/dL   AST 57 (*) 0 - 37 U/L   ALT 90 (*) 0 - 53 U/L   Alkaline Phosphatase 94  39 - 117 U/L   Total Bilirubin 0.8  0.3 - 1.2 mg/dL   GFR calc non Af Amer 89 (*) >90 mL/min   GFR calc Af Amer >90  >90 mL/min   Comment:            The eGFR has been calculated     using the CKD EPI equation.     This calculation has not been     validated in all clinical     situations.     eGFR's persistently     <90 mL/min signify     possible Chronic Kidney Disease.  PROTIME-INR     Status: Abnormal   Collection Time    12/23/12  5:30 AM      Result Value Range   Prothrombin Time 22.5 (*) 11.6 - 15.2 seconds   INR 2.05 (*) 0.00  - 1.49     HEENT: normal Cardio: RRR and no murmurs Resp: CTA B/L and unlabored, decreased BS at bases GI: BS positive and distended in epigastrium, nontender Extremity:  Edema bilateral pedal Skin:   Wound C/D/I and bilateral knees with steristrips Neuro: Alert/Oriented, Abnormal Sensory tingling in toes with LT and Abnormal Motor 5/5 in BUE, 3-/5 L HF and KE, 2-/5 R HF and KE, 4+/5 B ADF,APF Musc/Skel:  Extremity tender bilateral knees GenNAD   Assessment/Plan: 1. Functional deficits secondary to bilat TKR which require 3+ hours per day of interdisciplinary therapy in a comprehensive inpatient rehab setting. Physiatrist is providing close team supervision and 24 hour management of active medical problems listed below. Physiatrist and rehab team continue to assess barriers to discharge/monitor patient progress toward functional and medical goals. FIM:                   Comprehension Comprehension Mode: Auditory Comprehension: 7-Follows complex conversation/direction: With no assist  Expression Expression Mode: Verbal Expression: 7-Expresses complex ideas: With no assist  Social Interaction Social Interaction: 7-Interacts appropriately with others - No medications needed.  Problem Solving Problem Solving: 7-Solves complex problems: Recognizes & self-corrects  Memory Memory: 7-Complete Independence: No helper Medical Problem List and Plan:  1. 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 DVT Prophylaxis/Anticoagulation: Coumadin for DVT prophylaxis. Monitor for any bleeding episodes Check vascular study  3. Pain Management: Robaxin and Dilaudid as needed.  4. Neuropsych: This patient is capable of making decisions on his own behalf.  5. Acute blood loss anemia. Followup CBC/transfuse if symptomatic  6.Ileus. Hold reglan, establish softener, laxative program once stooling decreases  7. Obstructive sleep apnea.  Continue CPAP   LOS (Days) 1 A FACE TO FACE EVALUATION WAS PERFORMED  Dalan Cowger E 12/23/2012, 8:28 AM

## 2012-12-23 NOTE — Progress Notes (Signed)
Physical Therapy Session Note  Patient Details  Name: Kent Ryan MRN: [id number] Date of Birth: 1950-08-28  Today's Date: 12/23/2012 Time: 4098-1191 Time Calculation (min): 30 min  Short Term Goals: Week 1:  PT Short Term Goal 1 (Week 1): Patient will perform sit<>stand transfers and stand pivot transfers with RW and min assist. PT Short Term Goal 2 (Week 1): Patient will perform bed mobility from flat surface with min assist. PT Short Term Goal 3 (Week 1): Patient will negotiate 5 steps with one handrail and min assist. PT Short Term Goal 4 (Week 1): Patient will negotiate ramp with RW and min assist.  Skilled Therapeutic Interventions/Progress Updates:    Pt reports continued 10/10 pain, per nsg pt is due for pain meds shortly after treatment session.  Pt reclined in geri chair with ice applied to B knees, R knee immobilizer donned to perform geri chair to w/c transfer with pt requiring max assist for scoot modified squat pivot transfer with pt demonstrating pain behaviors of grimacing with mobility. Pt performed wheelchair mobility for > 150 feet.   Therapy Documentation Precautions:  Precautions Precautions: Knee;Fall Precaution Comments: No KI on L LE, able to perform SLR with <10 degree lag on 12/23/12 Required Braces or Orthoses: Knee Immobilizer - Right Knee Immobilizer - Right: Discontinue once straight leg raise with < 10 degree lag Knee Immobilizer - Left: Discontinue once straight leg raise with < 10 degree lag Restrictions Weight Bearing Restrictions: No Other Position/Activity Restrictions: WBAT bilaterally    Pain: Pain Assessment Pain Assessment: 0-10 Pain Score: 10-Worst pain ever Pain Type: Acute pain;Surgical pain Pain Location: Knee Pain Orientation: Right;Left Pain Descriptors / Indicators: Aching;Stabbing Pain Frequency: Constant Pain Onset: Progressive Patients Stated Pain Goal: 4 Pain Intervention(s): Repositioned;Cold applied;Distraction;RN made  aware;Relaxation    Locomotion :Wheelchair Mobility > 150 feet with supervision assist intermittent assistance to navigate tight spaces due to elevated leg rests.                See FIM for current functional status  Therapy/Group: Individual Therapy  Jackelyn Knife 12/23/2012, 4:43 PM

## 2012-12-23 NOTE — Progress Notes (Signed)
Placed pt. On CPAP via pt. Home mask & tubing (nasal mask) auto titrate settings (min 5.0 max 18.0) Pt. Tolerating well at this time.

## 2012-12-23 NOTE — Evaluation (Signed)
Physical Therapy Assessment and Plan  Patient Details  Name: Kent Ryan MRN: [id number] Date of Birth: 26-Jan-1951  PT Diagnosis: Difficulty walking, Muscle weakness, Osteoarthritis, Pain in joint and Pain in B knees Rehab Potential: Good ELOS: 10-14 days   Today's Date: 12/23/2012 Time: 1100-1204 Time Calculation (min): 64 min  Problem List:  Patient Active Problem List   Diagnosis Date Noted  . Status post bilateral knee replacements 12/22/2012  . Ileus, postoperative 12/16/2012  . Postoperative anemia due to acute blood loss 12/16/2012  . Hyponatremia 12/16/2012  . OA (osteoarthritis) of knee 12/13/2012    Past Medical History:  Past Medical History  Diagnosis Date  . Hypertension   . Sleep apnea     cpap - does not know settings   . Arthritis    Past Surgical History:  Past Surgical History  Procedure Laterality Date  . Back surgery  1980s    L4-5   . Torn cartilage- bilateral knees surgery     . Total knee arthroplasty Bilateral 12/13/2012    Procedure: TOTAL KNEE BILATERAL;  Surgeon: Loanne Drilling, MD;  Location: [facility];  Service: Orthopedics;  Laterality: Bilateral;  Right Knee first    Assessment & Plan Clinical Impression: Kent Ryan is a 62 y.o. right-handed male admitted 12/12/2012 with end-stage degenerative changes of both knees. No change with conservative care. Patient independent prior to admission. Underwent bilateral total knee replacements 12/13/2012 per Dr. Despina Hick. Placed on Coumadin for DVT prophylaxis and advised weightbearing as tolerated. Postoperative pain management with epidural removed 12/15/2012. Acute blood loss anemia with latest hemoglobin 7.4 and monitored. Bouts of nausea vomiting with abdominal films 12/16/2012 showing ileus and placed on Reglan when necessary and repeat abdominal films 12/21/2012 with minimal improvement of colonic ileus . Patient's bowels have been regulated. Diet has been advanced to regular with no more nausea or  vomiting. Physical and occupational therapy evaluations completed 12/14/2012 with recommendations for physical medicine rehabilitation consult to consider inpatient rehabilitation services. Patient was felt to be a good candidate for inpatient rehabilitation services and was admitted for comprehensive rehabilitation program. Patient transferred to CIR on 12/22/2012 .   Patient currently requires min- max with mobility secondary to muscle weakness and decreased cardiorespiratoy endurance.  Prior to hospitalization, patient was modified independent  with mobility and lived with Spouse in a House home.  Home access is 5-6 steps with R handrail, also has rampStairs to enter.  Patient will benefit from skilled PT intervention to maximize safe functional mobility, minimize fall risk and decrease caregiver burden for planned discharge home with intermittent assist.  Anticipate patient will benefit from follow up OP at discharge.  PT - End of Session Activity Tolerance: Tolerates 30+ min activity with multiple rests Endurance Deficit: Yes Endurance Deficit Description: Fatigues quickly PT Assessment Rehab Potential: Good Barriers to Discharge: Decreased caregiver support PT Plan PT Intensity: Minimum of 1-2 x/day ,45 to 90 minutes PT Frequency: 5 out of 7 days PT Duration Estimated Length of Stay: 10-14 days PT Treatment/Interventions: Ambulation/gait training;Balance/vestibular training;Community reintegration;Discharge planning;DME/adaptive equipment instruction;Functional mobility training;Neuromuscular re-education;Patient/family education;Pain management;Psychosocial support;Skin care/wound management;Stair training;Therapeutic Activities;Therapeutic Exercise;UE/LE Coordination activities;UE/LE Strength taining/ROM;Wheelchair propulsion/positioning PT Recommendation Follow Up Recommendations: Outpatient PT;Home health PT Patient destination: Home Equipment Recommended: Rolling walker with 5"  wheels  Skilled Therapeutic Intervention Skilled Therapeutic intervention initiated after completion of evaluation. Patient assisted to/from bathroom. Patient requires min assist for standing balance and requires assistance from PT to manage clothing prior to toileting. Patient able to perform hygiene and  manage clothing without assistance, requires min assist for standing balance. Patient returned to recliner and left seated with all needs within reach.   PT Evaluation Precautions/Restrictions Precautions Precautions: Knee;Fall Precaution Comments: No KI on L LE, able to perform SLR with <10 degree lag on 12/23/12 Required Braces or Orthoses: Knee Immobilizer - Right Knee Immobilizer - Right: Discontinue once straight leg raise with < 10 degree lag Knee Immobilizer - Left: Discontinue once straight leg raise with < 10 degree lag Restrictions Weight Bearing Restrictions: No Other Position/Activity Restrictions: WBAT bilaterally General Chart Reviewed: Yes Family/Caregiver Present: No  Pain Pain Assessment Pain Assessment: 0-10 Pain Score:   9 Pain Type: Surgical pain Pain Location: Knee Pain Orientation: Right;Left Pain Descriptors / Indicators: Aching Pain Frequency: Occasional Pain Onset: Gradual Patients Stated Pain Goal: 3 Pain Intervention(s): Repositioned;Ambulation/increased activity Multiple Pain Sites: No Home Living/Prior Functioning Home Living Available Help at Discharge: Family;Available PRN/intermittently Type of Home: House Home Access: Stairs to enter [employer] of Steps: 5-6 steps with R handrail, also has ramp Entrance Stairs-Rails: Right Home Layout: Multi-level;Full bath on main level;Able to live on main level with bedroom/bathroom Alternate Level Stairs-Number of Steps: 5 steps down to family room; basement full flight to wood heater (pt reports he doesn't need to go to basement until cold weather) Alternate Level Stairs-Rails: Can reach  both  Lives With: Spouse Prior Function Level of Independence: Requires assistive device for independence (straight cane)  Able to Take Stairs?: Yes Driving: Yes Vocation: Retired Comments: 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 due to knees and caring for wife Vision/Perception  Vision - History Baseline Vision: Wears glasses only for reading Patient Visual Report: No change from baseline Vision - Assessment Eye Alignment: Within Functional Limits Perception Perception: Within Functional Limits Praxis Praxis: Intact  Cognition Overall Cognitive Status: Within Functional Limits for tasks assessed Arousal/Alertness: Awake/alert Orientation Level: Oriented X4 Memory: Appears intact Awareness: Appears intact Safety/Judgment: Appears intact Sensation Sensation Light Touch: Appears Intact Stereognosis: Not tested Hot/Cold: Not tested Proprioception: Appears Intact Coordination Gross Motor Movements are Fluid and Coordinated: No (secondary to ROM restrictions in B knees) Fine Motor Movements are Fluid and Coordinated: Yes Heel Shin Test: Unable secondary to ROM restrictions in B knees Motor  Motor Motor: Within Functional Limits  Mobility Bed Mobility Bed Mobility: Supine to Sit Supine to Sit: HOB flat;3: Mod assist Supine to Sit Details: Verbal cues for sequencing;Verbal cues for technique;Verbal cues for precautions/safety;Manual facilitation for weight shifting Sit to Supine: 3: Mod assist;HOB flat Sit to Supine - Details: Verbal cues for sequencing;Verbal cues for precautions/safety;Verbal cues for technique;Manual facilitation for weight shifting Transfers Sit to Stand: 2: Max assist;With upper extremity assist;With armrests;From chair/3-in-1;From toilet Sit to Stand Details: Verbal cues for sequencing;Verbal cues for technique;Visual cues/gestures for sequencing;Verbal cues for precautions/safety;Manual facilitation for weight  shifting Sit to Stand Details (indicate cue type and reason): Patient uses B UE to assist with lift, then "walks" his LE under him secondary to decreased ROM B knees Stand to Sit: 2: Max assist;With upper extremity assist;With armrests;To chair/3-in-1;To toilet Stand to Sit Details (indicate cue type and reason): Visual cues/gestures for sequencing;Verbal cues for technique;Verbal cues for sequencing;Verbal cues for precautions/safety;Manual facilitation for weight shifting Stand Pivot Transfers: 2: Max assist Stand Pivot Transfer Details: Visual cues/gestures for sequencing;Verbal cues for technique;Verbal cues for sequencing;Verbal cues for precautions/safety;Manual facilitation for weight shifting Stand Pivot Transfer Details (indicate cue type and reason): Patient uses B UE to assist with  lift, then "walks" his LE under him secondary to decreased ROM B knees Locomotion  Ambulation Ambulation: Yes Ambulation/Gait Assistance: 4: Min assist Ambulation Distance (Feet): 25 Feet Assistive device: Rolling walker Ambulation/Gait Assistance Details: Verbal cues for gait pattern;Tactile cues for posture;Verbal cues for safe use of DME/AE Ambulation/Gait Assistance Details: R KI donned for gait training secondary to inability to perform SLR. L KI discontinued secondary to ability to perform SLR with <10 lag on this date. Patient instructed in gait training recliner<>bathroom (25' x2) with RW and min assist. Verbal cues to increase B knee flexion and achieve heel strike during gait training. Gait Gait: Yes Gait Pattern: Wide base of support;Decreased step length - right;Decreased step length - left;Shuffle;Step-through pattern;Decreased stride length;Decreased hip/knee flexion - right;Decreased hip/knee flexion - left;Antalgic;Trunk flexed Stairs / Additional Locomotion Stairs: No (Unable to assess secondary to time constraints) Naval architect Mobility: No (Unable to assess secondary to  time constraints)  Trunk/Postural Assessment  Cervical Assessment Cervical Assessment: Within Functional Limits Thoracic Assessment Thoracic Assessment: Within Functional Limits Lumbar Assessment Lumbar Assessment: Within Functional Limits Postural Control Postural Control: Within Functional Limits  Balance Balance Balance Assessed: Yes Static Sitting Balance Static Sitting - Balance Support: Feet supported;No upper extremity supported Static Sitting - Level of Assistance: 5: Stand by assistance Static Sitting - Comment/# of Minutes: 10 min Static Standing Balance Static Standing - Balance Support: Bilateral upper extremity supported Static Standing - Level of Assistance: 4: Min assist Static Standing - Comment/# of Minutes: 2 min Extremity Assessment  RLE Assessment RLE Assessment: Exceptions to [facility] RLE AROM (degrees) Right Knee Extension: 12 RLE Strength RLE Overall Strength: Deficits;Due to pain RLE Overall Strength Comments: Unable to formall assess secondary to pain LLE Assessment LLE Assessment: Exceptions to WFL LLE AROM (degrees) Left Knee Extension: 13 LLE Strength LLE Overall Strength: Due to pain;Deficits LLE Overall Strength Comments: Unable to formall assess secondary to pain  FIM:  FIM - Banker Devices: Bed rails;Arm rests (bed elevated) Bed/Chair Transfer: 3: Supine > Sit: Mod A (lifting assist/Pt. 50-74%/lift 2 legs;2: Bed > Chair or W/C: Max A (lift and lower assist);3: Sit > Supine: Mod A (lifting assist/Pt. 50-74%/lift 2 legs);2: Chair or W/C > Bed: Max A (lift and lower assist) FIM - Locomotion: Wheelchair Locomotion: Wheelchair: 0: Activity did not occur FIM - Locomotion: Ambulation Locomotion: Ambulation Assistive Devices: Designer, industrial/product Ambulation/Gait Assistance: 4: Min assist Locomotion: Ambulation: 1: Travels less than 50 ft with minimal assistance (Pt.>75%) FIM - Locomotion: Stairs Locomotion:  Stairs: 0: Activity did not occur   Refer to Care Plan for Long Term Goals  Recommendations for other services: None  Discharge Criteria: Patient will be discharged from PT if patient refuses treatment 3 consecutive times without medical reason, if treatment goals not met, if there is a change in medical status, if patient makes no progress towards goals or if patient is discharged from hospital.  The above assessment, treatment plan, treatment alternatives and goals were discussed and mutually agreed upon: by patient  Chipper Herb. Hagen Bohorquez, PT, DPT  12/23/2012, 2:27 PM

## 2012-12-23 NOTE — Progress Notes (Signed)
ANTICOAGULATION CONSULT NOTE - Follow Up Consult  Pharmacy Consult for coumadin Indication: VTE prophylaxis  No Known Allergies  Patient Measurements: Height: 6\' 1"  (185.4 cm) Weight: 255 lb 1.6 oz (115.713 kg) IBW/kg (Calculated) : 79.9 Heparin Dosing Weight:   Vital Signs: Temp: 98.8 F (37.1 C) (06/26 0500) Temp src: Oral (06/26 0500) BP: 120/75 mmHg (06/26 0500) Pulse Rate: 77 (06/26 0500)  Labs:  Recent Labs  12/21/12 0402 12/22/12 0405 12/23/12 0530  HGB 7.8* 7.4* 8.0*  HCT 23.7* 23.4* 24.6*  PLT 301 334 400  LABPROT 24.6* 26.0* 22.5*  INR 2.34* 2.52* 2.05*  CREATININE 0.74 0.87 0.90    Estimated Creatinine Clearance: 113.4 ml/min (by C-G formula based on Cr of 0.9).   Medications:  Scheduled:  . atorvastatin  40 mg Oral q1800  . feeding supplement  237 mL Oral BID  . latanoprost  1 drop Both Eyes QHS  . polyethylene glycol  17 g Oral Daily  . potassium chloride  20 mEq Oral BID  . Warfarin - Pharmacist Dosing Inpatient   Does not apply q1800   Infusions:    Assessment: 62 yo male s/p TKA is currently on therapeutic coumadin.  INR is 2.05 today. Goal of Therapy:  INR 2-3 Monitor platelets by anticoagulation protocol: Yes   Plan:  1) Coumadin 5mg  po x1 2) INR in am  Kent Ryan, [name] 12/23/2012,8:36 AM

## 2012-12-23 NOTE — Evaluation (Signed)
Occupational Therapy Assessment and Plan  Patient Details  Name: Kent Ryan MRN: [id number] Date of Birth: 06-02-1951  OT Diagnosis: acute pain, muscle weakness (generalized) and pain in joint Rehab Potential: Rehab Potential: Good ELOS: 10-14 days   Today's Date: 12/23/2012 Time: 0903-1003 Time Calculation (min): 60 min  Problem List:  Patient Active Problem List   Diagnosis Date Noted  . Status post bilateral knee replacements 12/22/2012  . Ileus, postoperative 12/16/2012  . Postoperative anemia due to acute blood loss 12/16/2012  . Hyponatremia 12/16/2012  . OA (osteoarthritis) of knee 12/13/2012    Past Medical History:  Past Medical History  Diagnosis Date  . Hypertension   . Sleep apnea     cpap - does not know settings   . Arthritis    Past Surgical History:  Past Surgical History  Procedure Laterality Date  . Back surgery  1980s    L4-5   . Torn cartilage- bilateral knees surgery     . Total knee arthroplasty Bilateral 12/13/2012    Procedure: TOTAL KNEE BILATERAL;  Surgeon: Loanne Drilling, MD;  Location: [facility];  Service: Orthopedics;  Laterality: Bilateral;  Right Knee first    Assessment & Plan Clinical Impression: Patient is a 62 y.o. right-handed male admitted 12/12/2012 with end-stage degenerative changes of both knees. No change with conservative care. Patient independent prior to admission. Underwent bilateral total knee replacements 12/13/2012 per Dr. Despina Hick. Placed on Coumadin for DVT prophylaxis and advised weightbearing as tolerated. Postoperative pain management with epidural removed 12/15/2012. Acute blood loss anemia with latest hemoglobin 7.4 and monitored. Bouts of nausea vomiting with abdominal films 12/16/2012 showing ileus and placed on Reglan when necessary and repeat abdominal films 12/21/2012 with minimal improvement of colonic ileus . Patient's bowels have been regulated. Diet has been advanced to regular with no more nausea or vomiting.    Patient transferred to CIR on 12/22/2012 .    Patient currently requires max with basic self-care skills secondary to muscle weakness and muscle joint tightness and decreased standing balance and decreased postural control.  Prior to hospitalization, patient could complete ADLs with modified independent .  Patient will benefit from skilled intervention to increase independence with basic self-care skills and increase level of independence with iADL prior to discharge home with wife, however pt was primary caregiver for wife.  Anticipate patient will require intermittent supervision and no further OT follow recommended.  OT - End of Session Activity Tolerance: Tolerates 30+ min activity with multiple rests Endurance Deficit: Yes OT Assessment Rehab Potential: Good Barriers to Discharge: Decreased caregiver support Barriers to Discharge Comments: Pt provided physical care for wife, son works and can provide intermittent assistance OT Plan OT Intensity: Minimum of 1-2 x/day, 45 to 90 minutes OT Frequency: 5 out of 7 days OT Duration/Estimated Length of Stay: 10-14 days OT Treatment/Interventions: Metallurgist training;Community reintegration;Discharge planning;DME/adaptive equipment instruction;Functional mobility training;Neuromuscular re-education;Pain management;Patient/family education;Psychosocial support;Self Care/advanced ADL retraining;Skin care/wound managment;Therapeutic Activities;Therapeutic Exercise;UE/LE Strength taining/ROM;UE/LE Coordination activities OT Recommendation Patient destination: Home Follow Up Recommendations: None Equipment Recommended: Tub/shower seat   Skilled Therapeutic Intervention OT eval initiated, ADL assessment completed at sit to stand level at sink.  Pt required max-total assist with stand pivot to w/c progressing to mod assist with sit to stand at sink.  +2 for safety with initial stand pivot and sit to stand secondary to decreased knee flexion and  pain.  Discussed purpose of OT and goals.  Focus on sit <> stand and standing tolerance during self-care tasks  of bathing and dressing.  OT Evaluation Precautions/Restrictions  Restrictions Weight Bearing Restrictions: No Other Position/Activity Restrictions: WBAT bilaterally Pain Pain Assessment Pain Assessment: 0-10 Pain Score:   7 Pain Type: Acute pain;Surgical pain Pain Location: Knee Pain Orientation: Right;Left Pain Descriptors / Indicators: Aching;Discomfort;Sharp;Stabbing Pain Frequency: Occasional Pain Onset: Gradual Patients Stated Pain Goal: 3 Pain Intervention(s): Repositioned;Other (Comment) (Premedicated) Home Living/Prior Functioning Home Living Available Help at Discharge: Family;Available PRN/intermittently Type of Home: House Home Access: Stairs to enter [employer] of Steps: 5-6 steps with rail on Rt, also has ramp Entrance Stairs-Rails: Right Home Layout: Multi-level Alternate Level Stairs-Number of Steps: 5 steps down to family room; basement full flight to wood heater  Lives With: Spouse IADL History Homemaking Responsibilities: Yes Meal Prep Responsibility: Primary Laundry Responsibility: Primary Cleaning Responsibility: Primary Bill Paying/Finance Responsibility: Primary Shopping Responsibility: Primary Prior Function Level of Independence: Independent with basic ADLs;Requires assistive device for independence (using a cane PTA)  Able to Take Stairs?: Yes Driving: Yes Vocation: Retired Comments: 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 due to knees and caring for wife ADL ADL Grooming: Setup Where Assessed-Grooming: Sitting at sink Upper Body Bathing: Setup Where Assessed-Upper Body Bathing: Sitting at sink Lower Body Bathing: Maximal assistance Where Assessed-Lower Body Bathing: Sitting at sink;Standing at sink Upper Body Dressing: Setup Where Assessed-Upper Body Dressing: Sitting at  sink Lower Body Dressing: Maximal assistance Where Assessed-Lower Body Dressing: Sitting at sink;Standing at sink Vision/Perception  Vision - History Baseline Vision: Wears glasses only for reading Patient Visual Report: No change from baseline Vision - Assessment Eye Alignment: Within Functional Limits Perception Perception: Within Functional Limits Praxis Praxis: Intact  Cognition Overall Cognitive Status: Within Functional Limits for tasks assessed Arousal/Alertness: Awake/alert Orientation Level: Oriented X4 Memory: Appears intact Awareness: Appears intact Safety/Judgment: Appears intact Sensation Sensation Light Touch: Appears Intact Stereognosis: Not tested Hot/Cold: Not tested Proprioception: Appears Intact Coordination Fine Motor Movements are Fluid and Coordinated: Yes Mobility  Bed Mobility Bed Mobility: Supine to Sit Supine to Sit: HOB flat;3: Mod assist Transfers Sit to Stand: 1: +1 Total assist  Extremity/Trunk Assessment RUE Assessment RUE Assessment: Within Functional Limits LUE Assessment LUE Assessment: Within Functional Limits  FIM:  FIM - Grooming Grooming Steps: Wash, rinse, dry face;Wash, rinse, dry hands;Oral care, brush teeth, clean dentures;Brush, comb hair Grooming: 5: Set-up assist to obtain items FIM - Bathing Bathing Steps Patient Completed: Chest;Right Arm;Left Arm;Abdomen;Front perineal area;Buttocks Bathing: 3: Mod-Patient completes 5-7 7f 10 parts or 50-74% FIM - Upper Body Dressing/Undressing Upper body dressing/undressing steps patient completed: Thread/unthread right sleeve of pullover shirt/dresss;Thread/unthread left sleeve of pullover shirt/dress;Put head through opening of pull over shirt/dress;Pull shirt over trunk Upper body dressing/undressing: 5: Set-up assist to: Obtain clothing/put away FIM - Lower Body Dressing/Undressing Lower body dressing/undressing steps patient completed: Pull underwear up/down Lower body  dressing/undressing: 1: Total-Patient completed less than 25% of tasks FIM - Banker Devices: Bed rails;Arm rests (bed elevated) Bed/Chair Transfer: 3: Supine > Sit: Mod A (lifting assist/Pt. 50-74%/lift 2 legs;2: Bed > Chair or W/C: Max A (lift and lower assist);1: Two helpers (2nd helper to stabilize chair)   Refer to Care Plan for Long Term Goals  Recommendations for other services: None  Discharge Criteria: Patient will be discharged from OT if patient refuses treatment 3 consecutive times without medical reason, if treatment goals not met, if there is a change in medical status, if patient makes no progress towards goals or if patient is discharged  from hospital.  The above assessment, treatment plan, treatment alternatives and goals were discussed and mutually agreed upon: by patient  Leonette Monarch 12/23/2012, 10:24 AM

## 2012-12-23 NOTE — Progress Notes (Signed)
Patient information reviewed and entered into e[facility] system by Natascha Edmonds, RN, CRRN, PPS Coordinator.  Information including medical coding and functional independence measure will be reviewed and updated through discharge.    

## 2012-12-24 ENCOUNTER — Inpatient Hospital Stay (HOSPITAL_COMMUNITY): Payer: BC Managed Care – PPO | Admitting: Physical Therapy

## 2012-12-24 ENCOUNTER — Inpatient Hospital Stay (HOSPITAL_COMMUNITY): Payer: BC Managed Care – PPO | Admitting: *Deleted

## 2012-12-24 ENCOUNTER — Inpatient Hospital Stay (HOSPITAL_COMMUNITY): Payer: BC Managed Care – PPO | Admitting: Occupational Therapy

## 2012-12-24 LAB — PROTIME-INR
INR: 1.83 — ABNORMAL HIGH (ref 0.00–1.49)
Prothrombin Time: 20.6 s — ABNORMAL HIGH (ref 11.6–15.2)

## 2012-12-24 MED ORDER — WARFARIN SODIUM 7.5 MG PO TABS
7.5000 mg | ORAL_TABLET | Freq: Once | ORAL | Status: AC
  Administered 2012-12-24: 7.5 mg via ORAL
  Filled 2012-12-24 (×2): qty 1

## 2012-12-24 MED ORDER — HYDROCHLOROTHIAZIDE 12.5 MG PO CAPS
12.5000 mg | ORAL_CAPSULE | Freq: Every day | ORAL | Status: DC
  Administered 2012-12-24 – 2013-01-06 (×14): 12.5 mg via ORAL
  Filled 2012-12-24 (×16): qty 1

## 2012-12-24 MED ORDER — LISINOPRIL 20 MG PO TABS
20.0000 mg | ORAL_TABLET | Freq: Every day | ORAL | Status: DC
  Administered 2012-12-24 – 2013-01-06 (×14): 20 mg via ORAL
  Filled 2012-12-24 (×16): qty 1

## 2012-12-24 NOTE — Progress Notes (Signed)
Physical Therapy Session Note  Patient Details  Name: Octavio Matheney MRN: [id number] Date of Birth: 09/28/1950  Today's Date: 12/24/2012 Time: 4098-1191 Time Calculation (min): 64 min  Short Term Goals: Week 1:  PT Short Term Goal 1 (Week 1): Patient will perform sit<>stand transfers and stand pivot transfers with RW and min assist. PT Short Term Goal 2 (Week 1): Patient will perform bed mobility from flat surface with min assist. PT Short Term Goal 3 (Week 1): Patient will negotiate 5 steps with one handrail and min assist. PT Short Term Goal 4 (Week 1): Patient will negotiate ramp with RW and min assist.  Skilled Therapeutic Interventions/Progress Updates:    Patient received sitting in recliner. Session focused on gait training and AROM of B knees. See details below for gait training. Seated edge of elevated mat, patient performed heel slides with pillow case under foot to facilitate decreased friction and improved knee flexion. Patient performed x20 reps on each LE with 3-5" hold. AROM B Knees taken: L: 62 degrees, R: 53 degrees.  Patient returned to room and left seated in recliner with ice packs on B knees and all needs within reach.  Therapy Documentation Precautions:  Precautions Precautions: Knee;Fall Precaution Comments: No KI on L LE, able to perform SLR with <10 degree lag on 12/23/12 Required Braces or Orthoses: Knee Immobilizer - Right Knee Immobilizer - Right: Discontinue once straight leg raise with < 10 degree lag Knee Immobilizer - Left: Discontinue once straight leg raise with < 10 degree lag Restrictions Weight Bearing Restrictions: Yes (WBAT BLE) Other Position/Activity Restrictions: WBAT bilaterally Pain: Pain Assessment Pain Assessment: 0-10 Pain Score:   7 Pain Type: Acute pain Pain Location: Knee Pain Orientation: Right;Left Pain Descriptors / Indicators: Aching Pain Frequency: Constant Pain Onset: Progressive Patients Stated Pain Goal: 2 Multiple Pain  Sites: No Locomotion : Ambulation Ambulation: Yes Ambulation/Gait Assistance: 4: Min assist Ambulation Distance (Feet): 205 Feet Assistive device: Rolling walker Ambulation/Gait Assistance Details: Verbal cues for gait pattern;Tactile cues for posture;Verbal cues for safe use of DME/AE Ambulation/Gait Assistance Details: R KI donned for gait training secondary to inability to perform SLR. L KI discontinued secondary to ability to perform SLR <10 degree lag on this date. Patient instructed in gait training 205' x1 with RW and min assist. Verbal cues to increase B knee flexion and achieve heel strike. Gait Gait: Yes Gait Pattern: Wide base of support;Decreased step length - right;Decreased step length - left;Shuffle;Step-through pattern;Decreased stride length;Decreased hip/knee flexion - right;Decreased hip/knee flexion - left;Antalgic;Trunk flexed   See FIM for current functional status  Therapy/Group: Individual Therapy  Chipper Herb. Skylan Gift, PT, DPT  12/24/2012, 12:13 PM

## 2012-12-24 NOTE — Progress Notes (Signed)
Occupational Therapy Session Note  Patient Details  Name: Josedejesus Marcum MRN: [id number] Date of Birth: [birth year]  Today's Date: 12/24/2012 Time: 0832-0930 Time Calculation (min): 58 min  Short Term Goals: Week 1:  OT Short Term Goal 1 (Week 1): Pt will complete stand pivot transfer to toilet with min assist with necessary AD OT Short Term Goal 2 (Week 1): Pt will complete LB bathing with min assist with necessary AE OT Short Term Goal 3 (Week 1): Pt will complete LB dressing wiht min assist with necessary AE OT Short Term Goal 4 (Week 1): Pt will complete shower transfer with min assist with necessary AD OT Short Term Goal 5 (Week 1): Pt will tolerate standing for 5 mins to complete grooming tasks  Skilled Therapeutic Interventions/Progress Updates:    Pt seen for ADL retraining with focus on functional mobility with RW, sit to stand, standing tolerance, and use of AE to assist with LB dressing.  Pt in bed upon arrival, required increased time and effort with sit to stand from elevated bed, however only min/steady assist this session.  Grooming completed in standing with min guard/steady assist.  Engaged in toilet transfer with min-mod assist with sidestepping to commode. Bathing at shower level in standing with min/steady assist for balance.  LB dressing completed at sit to stand level from [facility] to allow for elevated surface.  Pt returned to recliner with all needs in reach and informed nurse tech of need for ice packs.  Therapy Documentation Precautions:  Precautions Precautions: Knee;Fall Precaution Comments: No KI on L LE, able to perform SLR with <10 degree lag on 12/23/12 Required Braces or Orthoses: Knee Immobilizer - Right Knee Immobilizer - Right: Discontinue once straight leg raise with < 10 degree lag Knee Immobilizer - Left: Discontinue once straight leg raise with < 10 degree lag Restrictions Weight Bearing Restrictions: No Other Position/Activity Restrictions: WBAT  bilaterally Pain: Pain Assessment Pain Assessment: 0-10 Pain Score:   8 Pain Type: Acute pain Pain Location: Knee Pain Orientation: Right;Left Pain Descriptors / Indicators: Aching Pain Frequency: Constant Pain Onset: Progressive Patients Stated Pain Goal: 4 Pain Intervention(s): Medication (See eMAR) Multiple Pain Sites: No  See FIM for current functional status  Therapy/Group: Individual Therapy  Leonette Monarch 12/24/2012, 10:24 AM

## 2012-12-24 NOTE — Progress Notes (Signed)
Physical Therapy Session Note  Patient Details  Name: Kent Ryan MRN: [id number] Date of Birth: [birth year]  Today's Date: 12/24/2012 Time: 1330-1415 Time Calculation (min): 45 min  Short Term Goals: Week 1:  PT Short Term Goal 1 (Week 1): Patient will perform sit<>stand transfers and stand pivot transfers with RW and min assist. PT Short Term Goal 2 (Week 1): Patient will perform bed mobility from flat surface with min assist. PT Short Term Goal 3 (Week 1): Patient will negotiate 5 steps with one handrail and min assist. PT Short Term Goal 4 (Week 1): Patient will negotiate ramp with RW and min assist.  Skilled Therapeutic Interventions/Progress Updates:    Pt received sitting in geri chair with B LE elevated.  Pt performed sit<>stand transfer from geri chair and stand pivot transfers with mod assist with pt requiring mod vcs once in standing pt requires min assist to maintain balance.  Pt performed gait training approximately 200 feet with min assist, step to gait pattern with decreased step length, difficulty fully clearing the floor and difficulty with heel strike and toe off, slow cadence and pt required mod vcs to improve step length and gait pattern.  Pt performed w/c <> mat table transfers for scoot transfers with CGA. Supine and seated PROM, AAROM to B LEs to increase strength and ROM. Pt requires encouragement to perform ROM but is overall very motivated.  Therapy Documentation Precautions:  Precautions Precautions: Knee;Fall Precaution Comments: No KI on L LE, able to perform SLR with <10 degree lag on 12/23/12 Required Braces or Orthoses: Knee Immobilizer - Right Knee Immobilizer - Right: Discontinue once straight leg raise with < 10 degree lag Knee Immobilizer - Left: Discontinue once straight leg raise with < 10 degree lag Restrictions Weight Bearing Restrictions: Yes (WBAT BLE) Other Position/Activity Restrictions: WBAT bilaterally    Pain: Pain Assessment Pain  Assessment: 0-10 Pain Score:   8 Pain Type: Acute pain Pain Location: Knee Pain Orientation: Right;Left Pain Descriptors / Indicators: Sore Pain Frequency: Constant Pain Onset: Progressive Patients Stated Pain Goal: 4 Pain Intervention(s): Medication (See eMAR) Multiple Pain Sites: No                      See FIM for current functional status  Therapy/Group: Individual Therapy  Jackelyn Knife 12/24/2012, 4:40 PM

## 2012-12-24 NOTE — Progress Notes (Signed)
Pt has 3 + pitting edema LLE 2+ pitting edema RLE. Pulses, capillary refill WDL. Pt states he was on a lisinopril/HCTZ combination pill PTA. Deatra Ina PA notified. Hedy Camara

## 2012-12-24 NOTE — Progress Notes (Signed)
Nursing Note: Pt tolerated CPM 2 hours to each leg w/e of ice and prn pain meds.

## 2012-12-24 NOTE — Progress Notes (Signed)
Occupational Therapy Note  Patient Details  Name: Cornelio Parkerson MRN: [id number] Date of Birth: Jul 17, 1950 Today's Date: 12/24/2012  Time:  1520-1550  (30 min) Individual session Pain:  6/10  Both knees  See MAR  Pt. Sitting in wc.  Addressed sit to stand, standing balance.  Pt. Rolled wc to sink with minimal assist.  Practiced sit to stand with moderate assist and cues for hand placement.  Stood for 5 minutes.  Did 3 sets of knee flexion while standing with and without holding on to sink.  Pt. Sat back in wc and positioned beside bed with call bell in place.       Humberto Seals 12/24/2012, 6:11 PM

## 2012-12-24 NOTE — Progress Notes (Signed)
ANTICOAGULATION CONSULT NOTE - Follow Up Consult  Pharmacy Consult for coumadin Indication: VTE prophylaxis  No Known Allergies  Patient Measurements: Height: 6\' 1"  (185.4 cm) Weight: 255 lb 1.6 oz (115.713 kg) IBW/kg (Calculated) : 79.9 Heparin Dosing Weight:   Vital Signs: Temp: 98.9 F (37.2 C) (06/27 0500) Temp src: Oral (06/27 0500) BP: 117/76 mmHg (06/27 0500) Pulse Rate: 92 (06/27 0500)  Labs:  Recent Labs  12/22/12 0405 12/23/12 0530 12/24/12 0553  HGB 7.4* 8.0*  --   HCT 23.4* 24.6*  --   PLT 334 400  --   LABPROT 26.0* 22.5* 20.6*  INR 2.52* 2.05* 1.83*  CREATININE 0.87 0.90  --     Estimated Creatinine Clearance: 113.4 ml/min (by C-G formula based on Cr of 0.9).   Medications:  Scheduled:  . atorvastatin  40 mg Oral q1800  . feeding supplement  237 mL Oral BID  . latanoprost  1 drop Both Eyes QHS  . polyethylene glycol  17 g Oral Daily  . Warfarin - Pharmacist Dosing Inpatient   Does not apply q1800   Infusions:    Assessment: 62 yo male s/p TKA is currently on subtherapeutic coumadin.  INR down to 1.83. Goal of Therapy:  INR 2-3 Monitor platelets by anticoagulation protocol: Yes   Plan:  1) Coumadin 7.5mg  po x1 2) INR in am  Jemimah Cressy, [name] 12/24/2012,8:27 AM

## 2012-12-24 NOTE — Progress Notes (Signed)
Patient ID: Kent Ryan, male   DOB: Aug 05, 1950, 62 y.o.   MRN: [id number]  Subjective/Complaints: 62 y.o. right-handed male admitted 12/12/2012 with end-stage degenerative changes of both knees. No change with conservative care. Patient independent prior to admission. Underwent bilateral total knee replacements 12/13/2012 per Dr. Despina Hick. Placed on Coumadin for DVT prophylaxis and advised weightbearing as tolerated. Postoperative pain management with epidural removed 12/15/2012. Acute blood loss anemia with latest hemoglobin 7.4 and monitored. Bouts of nausea vomiting with abdominal films 12/16/2012 showing ileus and placed on Reglan when necessary and repeat abdominal films 12/21/2012 with minimal improvement of colonic ileus . Patient's bowels have been regulated. Diet has been advanced to regular with no more nausea or vomiting  Knee pain ok at rest, but increased with activity  used CPM yesterday, last reading 65 degrees   Review of Systems  Gastrointestinal: Positive for diarrhea.  Musculoskeletal: Positive for joint pain.  Psychiatric/Behavioral: The patient does not have insomnia.   All other systems reviewed and are negative.    Objective: Vital Signs: Blood pressure 117/76, pulse 92, temperature 98.9 F (37.2 C), temperature source Oral, resp. rate 19, height 6\' 1"  (1.854 m), weight 115.713 kg (255 lb 1.6 oz), SpO2 99.00%. No results found. Results for orders placed during the hospital encounter of 12/22/12 (from the past 72 hour(s))  CBC WITH DIFFERENTIAL     Status: Abnormal   Collection Time    12/23/12  5:30 AM      Result Value Range   WBC 11.1 (*) 4.0 - 10.5 K/uL   RBC 2.91 (*) 4.22 - 5.81 MIL/uL   Hemoglobin 8.0 (*) 13.0 - 17.0 g/dL   HCT 40.9 (*) 81.1 - 91.4 %   MCV 84.5  78.0 - 100.0 fL   MCH 27.5  26.0 - 34.0 pg   MCHC 32.5  30.0 - 36.0 g/dL   RDW 78.2  95.6 - 21.3 %   Platelets 400  150 - 400 K/uL   Neutrophils Relative % 76  43 - 77 %   Lymphocytes Relative  13  12 - 46 %   Monocytes Relative 8  3 - 12 %   Eosinophils Relative 3  0 - 5 %   Basophils Relative 0  0 - 1 %   Neutro Abs 8.5 (*) 1.7 - 7.7 K/uL   Lymphs Abs 1.4  0.7 - 4.0 K/uL   Monocytes Absolute 0.9  0.1 - 1.0 K/uL   Eosinophils Absolute 0.3  0.0 - 0.7 K/uL   Basophils Absolute 0.0  0.0 - 0.1 K/uL   RBC Morphology POLYCHROMASIA PRESENT     Comment: RARE NRBCs   WBC Morphology MILD LEFT SHIFT (1-5% METAS, OCC MYELO, OCC BANDS)    COMPREHENSIVE METABOLIC PANEL     Status: Abnormal   Collection Time    12/23/12  5:30 AM      Result Value Range   Sodium 137  135 - 145 mEq/L   Potassium 3.4 (*) 3.5 - 5.1 mEq/L   Chloride 104  96 - 112 mEq/L   CO2 27  19 - 32 mEq/L   Glucose, Bld 96  70 - 99 mg/dL   BUN 9  6 - 23 mg/dL   Creatinine, Ser 0.86  0.50 - 1.35 mg/dL   Calcium 9.6  8.4 - 57.8 mg/dL   Total Protein 5.3 (*) 6.0 - 8.3 g/dL   Albumin 2.1 (*) 3.5 - 5.2 g/dL   AST 57 (*) 0 - 37 U/L  ALT 90 (*) 0 - 53 U/L   Alkaline Phosphatase 94  39 - 117 U/L   Total Bilirubin 0.8  0.3 - 1.2 mg/dL   GFR calc non Af Amer 89 (*) >90 mL/min   GFR calc Af Amer >90  >90 mL/min   Comment:            The eGFR has been calculated     using the CKD EPI equation.     This calculation has not been     validated in all clinical     situations.     eGFR's persistently     <90 mL/min signify     possible Chronic Kidney Disease.  PROTIME-INR     Status: Abnormal   Collection Time    12/23/12  5:30 AM      Result Value Range   Prothrombin Time 22.5 (*) 11.6 - 15.2 seconds   INR 2.05 (*) 0.00 - 1.49  PROTIME-INR     Status: Abnormal   Collection Time    12/24/12  5:53 AM      Result Value Range   Prothrombin Time 20.6 (*) 11.6 - 15.2 seconds   INR 1.83 (*) 0.00 - 1.49     HEENT: normal Cardio: RRR and no murmurs Resp: CTA B/L and unlabored, decreased BS at bases GI: BS positive and distended in epigastrium, nontender Extremity:  Edema bilateral pedal Skin:   Wound C/D/I and  bilateral knees with steristrips Neuro: Alert/Oriented, Abnormal Sensory tingling in toes with LT and Abnormal Motor 5/5 in BUE, 3-/5 L HF and KE, 2-/5 R HF and KE, 4+/5 B ADF,APF Musc/Skel:  Extremity tender bilateral knees GenNAD   Assessment/Plan: 1. Functional deficits secondary to bilat TKR which require 3+ hours per day of interdisciplinary therapy in a comprehensive inpatient rehab setting. Physiatrist is providing close team supervision and 24 hour management of active medical problems listed below. Physiatrist and rehab team continue to assess barriers to discharge/monitor patient progress toward functional and medical goals. FIM: FIM - Bathing Bathing Steps Patient Completed: Chest;Right Arm;Left Arm;Abdomen;Front perineal area;Buttocks Bathing: 3: Mod-Patient completes 5-7 44f 10 parts or 50-74%  FIM - Upper Body Dressing/Undressing Upper body dressing/undressing steps patient completed: Thread/unthread right sleeve of pullover shirt/dresss;Thread/unthread left sleeve of pullover shirt/dress;Put head through opening of pull over shirt/dress;Pull shirt over trunk Upper body dressing/undressing: 5: Set-up assist to: Obtain clothing/put away FIM - Lower Body Dressing/Undressing Lower body dressing/undressing steps patient completed: Pull underwear up/down Lower body dressing/undressing: 1: Total-Patient completed less than 25% of tasks  FIM - Toileting Toileting steps completed by patient: Adjust clothing prior to toileting;Performs perineal hygiene;Adjust clothing after toileting Toileting Assistive Devices: Grab bar or rail for support Toileting: 4: Steadying assist  FIM - Diplomatic Services operational officer Devices: Art gallery manager Transfers: 3-To toilet/BSC: Mod A (lift or lower assist);4-From toilet/BSC: Min A (steadying Pt. > 75%)  FIM - Bed/Chair Transfer Bed/Chair Transfer Assistive Devices: Bed rails;Arm rests (bed elevated) Bed/Chair Transfer: 3: Supine > Sit:  Mod A (lifting assist/Pt. 50-74%/lift 2 legs;2: Bed > Chair or W/C: Max A (lift and lower assist);3: Sit > Supine: Mod A (lifting assist/Pt. 50-74%/lift 2 legs);2: Chair or W/C > Bed: Max A (lift and lower assist)  FIM - Locomotion: Wheelchair Locomotion: Wheelchair: 4: Travels 150 ft or more: maneuvers on rugs and over door sillls with minimal assistance (Pt.>75%) FIM - Locomotion: Ambulation Locomotion: Ambulation Assistive Devices: Designer, industrial/product Ambulation/Gait Assistance: 4: Min assist Locomotion: Ambulation: 1: [employer]  less than 50 ft with minimal assistance (Pt.>75%)  Comprehension Comprehension Mode: Auditory Comprehension: 7-Follows complex conversation/direction: With no assist  Expression Expression Mode: Verbal Expression: 7-Expresses complex ideas: With no assist  Social Interaction Social Interaction: 6-Interacts appropriately with others with medication or extra time (anti-anxiety, antidepressant).  Problem Solving Problem Solving: 6-Solves complex problems: With extra time  Memory Memory: 6-More than reasonable amt of time Medical Problem List and Plan:  1. 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 DVT Prophylaxis/Anticoagulation: Coumadin for DVT prophylaxis. Monitor for any bleeding episodes Check vascular study  3. Pain Management: Robaxin and Dilaudid as needed.  4. Neuropsych: This patient is capable of making decisions on his own behalf.  5. Acute blood loss anemia. Followup CBC/transfuse if symptomatic  6.Ileus. D/C reglan, establish softener, laxative program once stooling decreases  7. Obstructive sleep apnea. Continue CPAP   LOS (Days) 2 A FACE TO FACE EVALUATION WAS PERFORMED  Dynastie Knoop E 12/24/2012, 8:09 AM

## 2012-12-25 ENCOUNTER — Inpatient Hospital Stay (HOSPITAL_COMMUNITY): Payer: BC Managed Care – PPO | Admitting: Physical Therapy

## 2012-12-25 ENCOUNTER — Encounter (HOSPITAL_COMMUNITY): Payer: BC Managed Care – PPO | Admitting: Occupational Therapy

## 2012-12-25 ENCOUNTER — Inpatient Hospital Stay (HOSPITAL_COMMUNITY): Payer: BC Managed Care – PPO | Admitting: Occupational Therapy

## 2012-12-25 LAB — PROTIME-INR
INR: 1.63 — ABNORMAL HIGH (ref 0.00–1.49)
Prothrombin Time: 18.9 seconds — ABNORMAL HIGH (ref 11.6–15.2)

## 2012-12-25 MED ORDER — WARFARIN SODIUM 7.5 MG PO TABS
7.5000 mg | ORAL_TABLET | Freq: Once | ORAL | Status: AC
  Administered 2012-12-25: 7.5 mg via ORAL
  Filled 2012-12-25: qty 1

## 2012-12-25 MED ORDER — POTASSIUM CHLORIDE CRYS ER 10 MEQ PO TBCR
10.0000 meq | EXTENDED_RELEASE_TABLET | Freq: Two times a day (BID) | ORAL | Status: DC
  Administered 2012-12-25 – 2013-01-06 (×25): 10 meq via ORAL
  Filled 2012-12-25 (×29): qty 1

## 2012-12-25 MED ORDER — ENOXAPARIN SODIUM 30 MG/0.3ML ~~LOC~~ SOLN
30.0000 mg | Freq: Two times a day (BID) | SUBCUTANEOUS | Status: DC
  Administered 2012-12-25 – 2012-12-29 (×9): 30 mg via SUBCUTANEOUS
  Filled 2012-12-25 (×14): qty 0.3

## 2012-12-25 NOTE — Progress Notes (Signed)
Physical Therapy Note  Patient Details  Name: Kent Ryan MRN: [id number] Date of Birth: [birth year] Today's Date: 12/25/2012  1445-1515 (30 minutes) individual Pain: 8/10 bilateral knees/ nurse notified/ meds given Focus of treatment: Therapeutic exercise focused on AROM bilateral knee flexion/extension Treatment: wc mobility SBA on unit 150 feet level surfaces; transfers stand/turn RW close SBA WBAT bilateral LEs; sit to supine mod assist bilateral LEs (mat) ; supine to sit (mat) SBA with increased time; supine quad sets X 20 to facilitate knee extension; supine knee flexion/extension AA with /without therapy ball 3 X 10 ; seated active knee flexion/extension at edge of mat x 20   Lorie Cleckley,JIM 12/25/2012, 4:08 PM

## 2012-12-25 NOTE — Progress Notes (Signed)
ANTICOAGULATION CONSULT NOTE - Follow Up Consult  Pharmacy Consult for coumadin Indication: VTE prophylaxis  No Known Allergies  Patient Measurements: Height: 6\' 1"  (185.4 cm) Weight: 255 lb 1.6 oz (115.713 kg) IBW/kg (Calculated) : 79.9   Vital Signs: Temp: 97.8 F (36.6 C) (06/28 0548) Temp src: Oral (06/28 0548) BP: 133/78 mmHg (06/28 0548) Pulse Rate: 99 (06/28 0548)  Labs:  Recent Labs  12/23/12 0530 12/24/12 0553 12/25/12 0535  HGB 8.0*  --   --   HCT 24.6*  --   --   PLT 400  --   --   LABPROT 22.5* 20.6* 18.9*  INR 2.05* 1.83* 1.63*  CREATININE 0.90  --   --     Estimated Creatinine Clearance: 113.4 ml/min (by C-G formula based on Cr of 0.9).    Assessment: 62 yo male s/p TKA is currently on subtherapeutic coumadin.  INR down to 1.86.  No bleeding reported. LMWH 30 q12 started today per MD.   Goal of Therapy:  INR 2-3 Monitor platelets by anticoagulation protocol: Yes   Plan:  1) repeat Coumadin 7.5mg  po x1 2) INR in am 3) LMWH 30 mg sq q12h until INR > 2  Herby Abraham, Pharm.D. [phone] 12/25/2012 9:05 AM

## 2012-12-25 NOTE — Progress Notes (Signed)
Occupational Therapy Session Note  Patient Details  Name: Kent Ryan MRN: [id number] Date of Birth: September 19, 1950  Today's Date: 12/25/2012 Time: 1350-1450 am individual session Time Calculation (min): 60 min  Skilled Therapeutic Interventions/Progress Updates:  Time:  1350-1450 pm session  (60 minutes) Individual session  Attempt to complete home skills session in ADL apt as patient will be complete homeskills shortly after d/c from hospital.  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    Patient was able to walk to ADL apt via RW with extra time and 2 one minute rest breaks to relieve the pain in his knees.  Once in apt, patient requested ice to bilateral knees and a rest break.   Patient completed endurance activities seated and then returned to his room (rode in w/c) and more ice packed applied to both knees.   Patient very motivated to become independent again.  Knee immobilizer d/cd this morning per patient by the am Physical therapist  Therapy Documentation Precautions:  Precautions Precautions: Knee;Fall Precaution Comments: No KI on L LE, able to perform SLR with <10 degree lag on 12/23/12 Required Braces or Orthoses: Knee Immobilizer - Right Knee Immobilizer - Right: Discontinue once straight leg raise with < 10 degree lag Knee Immobilizer - Left: Discontinue once straight leg raise with < 10 degree lag Restrictions Weight Bearing Restrictions: Yes Other Position/Activity Restrictions: WBAT bilaterally  Pain: 10/10 pain constant.  Patient not able to have pain meds for 1.5 hours pler nurse  See FIM for current functional status  Therapy/Group: Individual Therapy  Bud Face [facility] 12/25/2012, 4:15 PM

## 2012-12-25 NOTE — Progress Notes (Signed)
Physical Therapy Session Note  Patient Details  Name: Kent Ryan MRN: [id number] Date of Birth: 09/29/1950  Today's Date: 12/25/2012 Time: 0730-0830 Time Calculation (min): 60 min  Short Term Goals: Week 1:  PT Short Term Goal 1 (Week 1): Patient will perform sit<>stand transfers and stand pivot transfers with RW and min assist. PT Short Term Goal 2 (Week 1): Patient will perform bed mobility from flat surface with min assist. PT Short Term Goal 3 (Week 1): Patient will negotiate 5 steps with one handrail and min assist. PT Short Term Goal 4 (Week 1): Patient will negotiate ramp with RW and min assist.  Therapy Documentation Precautions:  Precautions Precautions: Knee;Fall Precaution Comments: No KI on L LE, able to perform SLR with <10 degree lag on 12/23/12 Required Braces or Orthoses: Knee Immobilizer - Right Knee Immobilizer - Right: Discontinue once straight leg raise with < 10 degree lag Knee Immobilizer - Left: Discontinue once straight leg raise with < 10 degree lag Restrictions Weight Bearing Restrictions: Yes (WBAT BLE) Other Position/Activity Restrictions: WBAT bilaterally Pain: B knee pains 4 to 5/10 at rest (nursing notified for pain medications with breakfast)  Therapeutic Exercise:(30') B LE's in supine and sitting with emphasis on ROM for extension and flexion. Knee extension R limited 5 degrees into terminal knee extension, L is to 0 degrees. Knee flexion on R 50, L 55 degrees. Therapeutic Activity:(15') Orthostatic measures performed prior to OOB;   BP  HR O2 sats Supine/rest 113/72  94 96 Sit 1  135/82  106 97 Sit 2  140/77  102 99 Stand  131/75  107 98            Bed mobility scoot to HOB Mod-I using head board, supine->sit with min-A for R lower LE, Transfer training bed to w/c min-Assist from elevated surface Gait Training:(15') using RW x 20' S/Mod-I   See FIM for current functional status  Therapy/Group: Individual Therapy  Rex Kras 12/25/2012, 7:39 AM

## 2012-12-25 NOTE — Progress Notes (Signed)
Patient ID: Kent Ryan, male   DOB: 30-Sep-1950, 62 y.o.   MRN: [id number]  Subjective/Complaints: 62 y.o. right-handed male admitted 12/12/2012 with end-stage degenerative changes of both knees. No change with conservative care. Patient independent prior to admission. Underwent bilateral total knee replacements 12/13/2012 per Dr. Despina Hick. Placed on Coumadin for DVT prophylaxis and advised weightbearing as tolerated. Postoperative pain management with epidural removed 12/15/2012. Acute blood loss anemia with latest hemoglobin 7.4 and monitored. Bouts of nausea vomiting with abdominal films 12/16/2012 showing ileus and placed on Reglan when necessary and repeat abdominal films 12/21/2012 with minimal improvement of colonic ileus . Patient's bowels have been regulated. Diet has been advanced to regular with no more nausea or vomiting  Knee pain ok at rest, but increased with activity, physical therapist notes poor progress with knee flexion  used CPM yesterday, last reading 65 degrees   Review of Systems  Gastrointestinal: Positive for diarrhea.  Musculoskeletal: Positive for joint pain.  Psychiatric/Behavioral: The patient does not have insomnia.   All other systems reviewed and are negative.    Objective: Vital Signs: Blood pressure 133/78, pulse 99, temperature 97.8 F (36.6 C), temperature source Oral, resp. rate 18, height 6\' 1"  (1.854 m), weight 115.713 kg (255 lb 1.6 oz), SpO2 100.00%. No results found. Results for orders placed during the hospital encounter of 12/22/12 (from the past 72 hour(s))  CBC WITH DIFFERENTIAL     Status: Abnormal   Collection Time    12/23/12  5:30 AM      Result Value Range   WBC 11.1 (*) 4.0 - 10.5 K/uL   RBC 2.91 (*) 4.22 - 5.81 MIL/uL   Hemoglobin 8.0 (*) 13.0 - 17.0 g/dL   HCT 40.9 (*) 81.1 - 91.4 %   MCV 84.5  78.0 - 100.0 fL   MCH 27.5  26.0 - 34.0 pg   MCHC 32.5  30.0 - 36.0 g/dL   RDW 78.2  95.6 - 21.3 %   Platelets 400  150 - 400 K/uL   Neutrophils Relative % 76  43 - 77 %   Lymphocytes Relative 13  12 - 46 %   Monocytes Relative 8  3 - 12 %   Eosinophils Relative 3  0 - 5 %   Basophils Relative 0  0 - 1 %   Neutro Abs 8.5 (*) 1.7 - 7.7 K/uL   Lymphs Abs 1.4  0.7 - 4.0 K/uL   Monocytes Absolute 0.9  0.1 - 1.0 K/uL   Eosinophils Absolute 0.3  0.0 - 0.7 K/uL   Basophils Absolute 0.0  0.0 - 0.1 K/uL   RBC Morphology POLYCHROMASIA PRESENT     Comment: RARE NRBCs   WBC Morphology MILD LEFT SHIFT (1-5% METAS, OCC MYELO, OCC BANDS)    COMPREHENSIVE METABOLIC PANEL     Status: Abnormal   Collection Time    12/23/12  5:30 AM      Result Value Range   Sodium 137  135 - 145 mEq/L   Potassium 3.4 (*) 3.5 - 5.1 mEq/L   Chloride 104  96 - 112 mEq/L   CO2 27  19 - 32 mEq/L   Glucose, Bld 96  70 - 99 mg/dL   BUN 9  6 - 23 mg/dL   Creatinine, Ser 0.86  0.50 - 1.35 mg/dL   Calcium 9.6  8.4 - 57.8 mg/dL   Total Protein 5.3 (*) 6.0 - 8.3 g/dL   Albumin 2.1 (*) 3.5 - 5.2 g/dL   AST 57 (*)  0 - 37 U/L   ALT 90 (*) 0 - 53 U/L   Alkaline Phosphatase 94  39 - 117 U/L   Total Bilirubin 0.8  0.3 - 1.2 mg/dL   GFR calc non Af Amer 89 (*) >90 mL/min   GFR calc Af Amer >90  >90 mL/min   Comment:            The eGFR has been calculated     using the CKD EPI equation.     This calculation has not been     validated in all clinical     situations.     eGFR's persistently     <90 mL/min signify     possible Chronic Kidney Disease.  PROTIME-INR     Status: Abnormal   Collection Time    12/23/12  5:30 AM      Result Value Range   Prothrombin Time 22.5 (*) 11.6 - 15.2 seconds   INR 2.05 (*) 0.00 - 1.49  PROTIME-INR     Status: Abnormal   Collection Time    12/24/12  5:53 AM      Result Value Range   Prothrombin Time 20.6 (*) 11.6 - 15.2 seconds   INR 1.83 (*) 0.00 - 1.49  PROTIME-INR     Status: Abnormal   Collection Time    12/25/12  5:35 AM      Result Value Range   Prothrombin Time 18.9 (*) 11.6 - 15.2 seconds   INR 1.63 (*)  0.00 - 1.49     HEENT: normal Cardio: RRR and no murmurs Resp: CTA B/L and unlabored, decreased BS at bases GI: BS positive and distended in epigastrium, nontender Extremity:  Edema bilateral pedal Skin:   Wound C/D/I and bilateral knees with steristrips Neuro: Alert/Oriented, Abnormal Sensory tingling in toes with LT and Abnormal Motor 5/5 in BUE, 3-/5 L HF and KE, 2-/5 R HF and KE, 4+/5 B ADF,APF Musc/Skel:  Extremity tender bilateral knees GenNAD   Assessment/Plan: 1. Functional deficits secondary to bilat TKR which require 3+ hours per day of interdisciplinary therapy in a comprehensive inpatient rehab setting. Physiatrist is providing close team supervision and 24 hour management of active medical problems listed below. Physiatrist and rehab team continue to assess barriers to discharge/monitor patient progress toward functional and medical goals. FIM: FIM - Bathing Bathing Steps Patient Completed: Chest;Right Arm;Left Arm;Abdomen;Front perineal area;Buttocks;Right upper leg;Left upper leg Bathing: 4: Min-Patient completes 8-9 4f 10 parts or 75+ percent  FIM - Upper Body Dressing/Undressing Upper body dressing/undressing steps patient completed: Thread/unthread right sleeve of front closure shirt/dress;Thread/unthread left sleeve of front closure shirt/dress;Pull shirt around back of front closure shirt/dress;Button/unbutton shirt Upper body dressing/undressing: 5: Set-up assist to: Obtain clothing/put away FIM - Lower Body Dressing/Undressing Lower body dressing/undressing steps patient completed: Thread/unthread right pants leg;Thread/unthread left pants leg;Pull pants up/down Lower body dressing/undressing: 3: Mod-Patient completed 50-74% of tasks  FIM - Toileting Toileting steps completed by patient: Adjust clothing prior to toileting;Performs perineal hygiene;Adjust clothing after toileting Toileting Assistive Devices: Grab bar or rail for support Toileting: 4: Steadying  assist  FIM - Diplomatic Services operational officer Devices: Art gallery manager Transfers: 3-From toilet/BSC: Mod A (lift or lower assist);3-To toilet/BSC: Mod A (lift or lower assist)  FIM - Bed/Chair Transfer Bed/Chair Transfer Assistive Devices: Arm rests;Walker Bed/Chair Transfer: 3: Bed > Chair or W/C: Mod A (lift or lower assist);3: Chair or W/C > Bed: Mod A (lift or lower assist)  FIM - Locomotion: Wheelchair Locomotion: Wheelchair:  1: Total Assistance/staff pushes wheelchair (Pt<25%) FIM - Locomotion: Ambulation Locomotion: Ambulation Assistive Devices: Walker - Rolling Ambulation/Gait Assistance: 4: Min assist Locomotion: Ambulation: 4: Travels 150 ft or more with minimal assistance (Pt.>75%)  Comprehension Comprehension Mode: Auditory Comprehension: 7-Follows complex conversation/direction: With no assist  Expression Expression Mode: Verbal Expression: 7-Expresses complex ideas: With no assist  Social Interaction Social Interaction: 6-Interacts appropriately with others with medication or extra time (anti-anxiety, antidepressant).  Problem Solving Problem Solving: 7-Solves complex problems: Recognizes & self-corrects  Memory Memory: 7-Complete Independence: No helper Medical Problem List and Plan:  1. 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 DVT Prophylaxis/Anticoagulation: Coumadin for DVT prophylaxis. Monitor for any bleeding episodes Check vascular study  3. Pain Management: Robaxin and Dilaudid as needed.  4. Neuropsych: This patient is capable of making decisions on his own behalf.  5. Acute blood loss anemia. Followup CBC/transfuse if symptomatic  6.Ileus. D/C reglan, establish softener, laxative program once stooling decreases  7. Obstructive sleep apnea. Continue CPAP 8. Hyperlipidemia, mild, supplement potassium, on HCT Z.  LOS (Days) 3 A FACE TO FACE EVALUATION WAS PERFORMED  Sheryll Dymek  E 12/25/2012, 9:47 AM

## 2012-12-26 ENCOUNTER — Inpatient Hospital Stay (HOSPITAL_COMMUNITY): Payer: BC Managed Care – PPO | Admitting: *Deleted

## 2012-12-26 DIAGNOSIS — D62 Acute posthemorrhagic anemia: Secondary | ICD-10-CM

## 2012-12-26 DIAGNOSIS — K560 Paralytic ileus: Secondary | ICD-10-CM

## 2012-12-26 DIAGNOSIS — Z96659 Presence of unspecified artificial knee joint: Secondary | ICD-10-CM

## 2012-12-26 DIAGNOSIS — M1710 Unilateral primary osteoarthritis, unspecified knee: Secondary | ICD-10-CM

## 2012-12-26 LAB — CBC
MCH: 27.1 pg (ref 26.0–34.0)
MCHC: 31.9 g/dL (ref 30.0–36.0)
MCV: 84.8 fL (ref 78.0–100.0)
Platelets: 445 10*3/uL — ABNORMAL HIGH (ref 150–400)
RDW: 13.9 % (ref 11.5–15.5)

## 2012-12-26 MED ORDER — WARFARIN SODIUM 7.5 MG PO TABS
7.5000 mg | ORAL_TABLET | Freq: Once | ORAL | Status: AC
  Administered 2012-12-26: 7.5 mg via ORAL
  Filled 2012-12-26: qty 1

## 2012-12-26 NOTE — Progress Notes (Signed)
Patient already on CPAP at this time. Tolerating well, RT will continue to monitor.

## 2012-12-26 NOTE — Progress Notes (Signed)
Occupational Therapy Note  Patient Details  Name: Kent Ryan MRN: [id number] Date of Birth: [birth year] Today's Date: 12/26/2012  Time:  1030-1130  (60 min) Pain:  6/10 both knees Individual session     Addressed bed mobility, sit to stand, standing balance, supine to sit (bed) SBA with increased time, functional mobility, shower transfer, toilet transfer.  Pt. Sat on EOB with no issues with balance.  Ambulated to toilet with RW with supervision.  Transfered to toilet with SBA and then to shower.  Pt stood in shower with RW and bathed.  Used LH sponge for back and feet.  Needed close supervision for safety while in shower.  Dressed self at toilet level.  Needed assist with socks, teds.  Ambulated back to wc and left with call bell in place.     Humberto Seals 12/26/2012, 11:03 AM

## 2012-12-26 NOTE — Progress Notes (Signed)
Patient ID: Kent Ryan, male   DOB: 1951-02-27, 62 y.o.   MRN: [id number]  Subjective/Complaints: 62 y.o. right-handed male admitted 12/12/2012 with end-stage degenerative changes of both knees. No change with conservative care. Patient independent prior to admission. Underwent bilateral total knee replacements 12/13/2012 per Dr. Despina Hick. Placed on Coumadin for DVT prophylaxis and advised weightbearing as tolerated. Postoperative pain management with epidural removed 12/15/2012. Acute blood loss anemia with latest hemoglobin 7.4 and monitored. Bouts of nausea vomiting with abdominal films 12/16/2012 showing ileus and placed on Reglan when necessary and repeat abdominal films 12/21/2012 with minimal improvement of colonic ileus   Work done massage and self range of motion yesterday at as instructed by physical therapist Review of Systems  Musculoskeletal: Positive for joint pain.  All other systems reviewed and are negative.    Objective: Vital Signs: Blood pressure 108/67, pulse 88, temperature 98.9 F (37.2 C), temperature source Oral, resp. rate 17, height 6\' 1"  (1.854 m), weight 115.713 kg (255 lb 1.6 oz), SpO2 98.00%. No results found. Results for orders placed during the hospital encounter of 12/22/12 (from the past 72 hour(s))  PROTIME-INR     Status: Abnormal   Collection Time    12/24/12  5:53 AM      Result Value Range   Prothrombin Time 20.6 (*) 11.6 - 15.2 seconds   INR 1.83 (*) 0.00 - 1.49  PROTIME-INR     Status: Abnormal   Collection Time    12/25/12  5:35 AM      Result Value Range   Prothrombin Time 18.9 (*) 11.6 - 15.2 seconds   INR 1.63 (*) 0.00 - 1.49  PROTIME-INR     Status: Abnormal   Collection Time    12/26/12  6:15 AM      Result Value Range   Prothrombin Time 20.5 (*) 11.6 - 15.2 seconds   INR 1.82 (*) 0.00 - 1.49  CBC     Status: Abnormal   Collection Time    12/26/12  8:51 AM      Result Value Range   WBC 12.4 (*) 4.0 - 10.5 K/uL   RBC 3.36 (*)  4.22 - 5.81 MIL/uL   Hemoglobin 9.1 (*) 13.0 - 17.0 g/dL   HCT 62.9 (*) 52.8 - 41.3 %   MCV 84.8  78.0 - 100.0 fL   MCH 27.1  26.0 - 34.0 pg   MCHC 31.9  30.0 - 36.0 g/dL   RDW 24.4  01.0 - 27.2 %   Platelets 445 (*) 150 - 400 K/uL        Assessment/Plan: 1. 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 Physiatrist is providing close team supervision and 24 hour management of active medical problems listed below. Physiatrist and rehab team continue to assess barriers to discharge/monitor patient progress toward functional and medical goals. FIM: FIM - Bathing Bathing Steps Patient Completed: Chest;Right Arm;Left Arm;Abdomen;Front perineal area;Buttocks;Right upper leg;Left upper leg Bathing: 4: Min-Patient completes 8-9 3f 10 parts or 75+ percent (assist for feet)  FIM - Upper Body Dressing/Undressing Upper body dressing/undressing steps patient completed: Put head through opening of pull over shirt/dress;Thread/unthread left sleeve of pullover shirt/dress;Thread/unthread right sleeve of pullover shirt/dresss;Pull shirt over trunk Upper body dressing/undressing: 5: Supervision: Safety issues/verbal cues FIM - Lower Body Dressing/Undressing Lower body dressing/undressing steps patient completed: Thread/unthread right pants leg;Thread/unthread left pants leg;Pull pants up/down (wore  teds and tredded socks) Lower body dressing/undressing: 3: Mod-Patient completed 50-74% of tasks  FIM - Toileting Toileting steps completed by patient: Adjust clothing prior to toileting;Performs perineal hygiene;Adjust clothing after toileting Toileting Assistive Devices: Grab bar or rail for support Toileting: 0: Activity did not occur  FIM - Diplomatic Services operational officer Devices: Art gallery manager Transfers: 0-Activity did not  occur  FIM - Banker Devices: Arm rests;Walker Bed/Chair Transfer: 3: Bed > Chair or W/C: Mod A (lift or lower assist);3: Chair or W/C > Bed: Mod A (lift or lower assist)  FIM - Locomotion: Wheelchair Locomotion: Wheelchair: 1: Total Assistance/staff pushes wheelchair (Pt<25%) FIM - Locomotion: Ambulation Locomotion: Ambulation Assistive Devices: Designer, industrial/product Ambulation/Gait Assistance: 4: Min assist Locomotion: Ambulation: 4: Travels 150 ft or more with minimal assistance (Pt.>75%)  Comprehension Comprehension Mode: Auditory Comprehension: 7-Follows complex conversation/direction: With no assist  Expression Expression Mode: Verbal Expression: 7-Expresses complex ideas: With no assist  Social Interaction Social Interaction: 7-Interacts appropriately with others - No medications needed.  Problem Solving Problem Solving: 7-Solves complex problems: Recognizes & self-corrects  Memory Memory: 7-Complete Independence: No helper  Medical Problem List and Plan:  1. 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 DVT Prophylaxis/Anticoagulation: Coumadin for DVT prophylaxis. Monitor for any bleeding episodes Check vascular study  3. Pain Management: Robaxin and Dilaudid as needed.  4. Neuropsych: This patient is capable of making decisions on his own behalf.  5. Acute blood loss anemia. Followup hemoglobin improved 6.Ileus. D/C reglan, establish softener, laxative program once stooling decreases  7. Obstructive sleep apnea. Continue CPAP  8. Hyperlipidemia, mild, supplement potassium, on HCT Z.  9. Leukocytosis without fever. Will monitor. The incisions looked great  LOS (Days) 4 A FACE TO FACE EVALUATION WAS PERFORMED  Evany Schecter E 12/26/2012, 10:11 AM

## 2012-12-26 NOTE — Progress Notes (Signed)
ANTICOAGULATION CONSULT NOTE - Follow Up Consult  Pharmacy Consult for coumadin Indication: VTE prophylaxis  No Known Allergies  Patient Measurements: Height: 6\' 1"  (185.4 cm) Weight: 255 lb 1.6 oz (115.713 kg) IBW/kg (Calculated) : 79.9   Vital Signs: Temp: 98.9 F (37.2 C) (06/29 0626) Temp src: Oral (06/29 0626) BP: 108/67 mmHg (06/29 0626) Pulse Rate: 88 (06/29 0626)  Labs:  Recent Labs  12/24/12 0553 12/25/12 0535 12/26/12 0615 12/26/12 0851  HGB  --   --   --  9.1*  HCT  --   --   --  28.5*  PLT  --   --   --  445*  LABPROT 20.6* 18.9* 20.5*  --   INR 1.83* 1.63* 1.82*  --     Estimated Creatinine Clearance: 113.4 ml/min (by C-G formula based on Cr of 0.9).    Assessment: 62 yo male s/p TKA is currently on subtherapeutic coumadin.  INR up to 1.82 today.  No bleeding reported. LMWH 30 q12 started 6/28 per MD. Hg up to 9.1 from 8.  Goal of Therapy:  INR 2-3 Monitor platelets by anticoagulation protocol: Yes   Plan:  1) repeat Coumadin 7.5mg  po x1 2) INR in am 3) LMWH 30 mg sq q12h until INR > 2  Herby Abraham, Pharm.D. [phone] 12/26/2012 9:52 AM

## 2012-12-26 NOTE — Progress Notes (Signed)
Patient refused CPM's, requesting to get on earlier, because he can't sleep when they're on. Encouraged patient to use everyday. BLE edema. steris to knees. Abd distended. Kent Ryan

## 2012-12-27 ENCOUNTER — Inpatient Hospital Stay (HOSPITAL_COMMUNITY): Payer: BC Managed Care – PPO | Admitting: Occupational Therapy

## 2012-12-27 ENCOUNTER — Inpatient Hospital Stay (HOSPITAL_COMMUNITY): Payer: BC Managed Care – PPO | Admitting: Physical Therapy

## 2012-12-27 MED ORDER — OXYCODONE HCL ER 10 MG PO T12A
10.0000 mg | EXTENDED_RELEASE_TABLET | Freq: Two times a day (BID) | ORAL | Status: DC
  Administered 2012-12-27 – 2013-01-06 (×21): 10 mg via ORAL
  Filled 2012-12-27 (×21): qty 1

## 2012-12-27 MED ORDER — WARFARIN SODIUM 10 MG PO TABS
10.0000 mg | ORAL_TABLET | Freq: Once | ORAL | Status: AC
  Administered 2012-12-27: 10 mg via ORAL
  Filled 2012-12-27: qty 1

## 2012-12-27 NOTE — Progress Notes (Signed)
Occupational Therapy Session Note  Patient Details  Name: Kent Ryan MRN: [id number] Date of Birth: February 10, 1951  Today's Date: 12/27/2012 Time: 0830-0930 and 1435-1500 Time Calculation (min): 60 min and 25 min  Short Term Goals: Week 1:  OT Short Term Goal 1 (Week 1): Pt will complete stand pivot transfer to toilet with min assist with necessary AD OT Short Term Goal 2 (Week 1): Pt will complete LB bathing with min assist with necessary AE OT Short Term Goal 3 (Week 1): Pt will complete LB dressing wiht min assist with necessary AE OT Short Term Goal 4 (Week 1): Pt will complete shower transfer with min assist with necessary AD OT Short Term Goal 5 (Week 1): Pt will tolerate standing for 5 mins to complete grooming tasks  Skilled Therapeutic Interventions/Progress Updates:    1) Pt seen for ADL retraining with focus on bed mobility, functional mobility with RW, standing tolerance, and use of AE to assist with self-care tasks of bathing and dressing.  Pt completed bed mobility with SBA this session and increased time.  Pt ambulated to walk-in shower with supervision, doffed socks in standing with UE support on grab bar and use of reacher to doff socks.  Bathing completed in standing with RW for UE support and demonstrated appropriate use of long handled sponge to wash feet and back.  Close supervision in shower for safety.  Pt completed dressing tasks from [facility].  Assistance provided to don TEDS and socks.  Grooming conducted in standing at sink.  Ambulated to recliner and positioned with all needs in reach.  2) Pt seen for 1:1 OT with focus on functional mobility with RW and engaging in home management tasks with RW.  Engaged in laundry with pt gathering dirty clothes and placing them in a bag, ambulating to laundry room with RW, and placing clothes in washing machine all with close supervision.  Discussed safety concerns with transporting laundry as pt used to carry items in arms to laundry room  and with walker will not be able to transport clothes this way.  Discussed alternative methods to increase safety with transporting items.  Pt returned to room at end of session, seated in w/c with elevated leg rests.  Student RN in room with pain meds and ice packs.  Therapy Documentation Precautions:  Precautions Precautions: Knee;Fall Precaution Comments: No KI on L LE, able to perform SLR with <10 degree lag on 12/23/12 Required Braces or Orthoses: Knee Immobilizer - Right Knee Immobilizer - Right: Discontinue once straight leg raise with < 10 degree lag Knee Immobilizer - Left: Discontinue once straight leg raise with < 10 degree lag Restrictions Weight Bearing Restrictions: Yes Other Position/Activity Restrictions: WBAT bilaterally General:   Vital Signs: Therapy Vitals Temp: 98.2 F (36.8 C) Temp src: Oral Pulse Rate: 93 Resp: 18 BP: 128/70 mmHg Patient Position, if appropriate: Lying Oxygen Therapy SpO2: 95 % O2 Device: None (Room air) Pain: Pain Assessment Pain Assessment: 0-10 Pain Score:   5 Pain Type: Acute pain Pain Location: Knee Pain Orientation: Right;Left Pain Frequency: Constant Pain Onset: On-going Patients Stated Pain Goal: 2  See FIM for current functional status  Therapy/Group: Individual Therapy  Leonette Monarch 12/27/2012, 10:33 AM

## 2012-12-27 NOTE — Progress Notes (Signed)
Patient ID: Kent Ryan, male   DOB: [birth year], 62 y.o.   MRN: [id number]  Subjective/Complaints: 62 y.o. right-handed male admitted 12/12/2012 with end-stage degenerative changes of both knees. No change with conservative care. Patient independent prior to admission. Underwent bilateral total knee replacements 12/13/2012 per Dr. Despina Hick. Placed on Coumadin for DVT prophylaxis and advised weightbearing as tolerated. Postoperative pain management with epidural removed 12/15/2012. Acute blood loss anemia with latest hemoglobin 7.4 and monitored. Bouts of nausea vomiting with abdominal films 12/16/2012 showing ileus and placed on Reglan when necessary and repeat abdominal films 12/21/2012 with minimal improvement of colonic ileus   Sleep disrupted by pain   Review of Systems  Musculoskeletal: Positive for joint pain.  All other systems reviewed and are negative.    Objective: Vital Signs: Blood pressure 120/71, pulse 92, temperature 97.8 F (36.6 C), temperature source Oral, resp. rate 18, height 6\' 1"  (1.854 m), weight 115.713 kg (255 lb 1.6 oz), SpO2 95.00%. No results found. Results for orders placed during the hospital encounter of 12/22/12 (from the past 72 hour(s))  PROTIME-INR     Status: Abnormal   Collection Time    12/25/12  5:35 AM      Result Value Range   Prothrombin Time 18.9 (*) 11.6 - 15.2 seconds   INR 1.63 (*) 0.00 - 1.49  PROTIME-INR     Status: Abnormal   Collection Time    12/26/12  6:15 AM      Result Value Range   Prothrombin Time 20.5 (*) 11.6 - 15.2 seconds   INR 1.82 (*) 0.00 - 1.49  CBC     Status: Abnormal   Collection Time    12/26/12  8:51 AM      Result Value Range   WBC 12.4 (*) 4.0 - 10.5 K/uL   RBC 3.36 (*) 4.22 - 5.81 MIL/uL   Hemoglobin 9.1 (*) 13.0 - 17.0 g/dL   HCT 14.7 (*) 82.9 - 56.2 %   MCV 84.8  78.0 - 100.0 fL   MCH 27.1  26.0 - 34.0 pg   MCHC 31.9  30.0 - 36.0 g/dL   RDW 13.0  86.5 - 78.4 %   Platelets 445 (*) 150 - 400 K/uL   PROTIME-INR     Status: Abnormal   Collection Time    12/27/12  5:50 AM      Result Value Range   Prothrombin Time 20.9 (*) 11.6 - 15.2 seconds   INR 1.86 (*) 0.00 - 1.49        Assessment/Plan: 1. 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 Physiatrist is providing close team supervision and 24 hour management of active medical problems listed below. Physiatrist and rehab team continue to assess barriers to discharge/monitor patient progress toward functional and medical goals. FIM: FIM - Bathing Bathing Steps Patient Completed: Chest;Right Arm;Left Arm;Abdomen;Front perineal area;Buttocks;Right upper leg;Left upper leg;Right lower leg (including foot);Left lower leg (including foot) Bathing: 5: Supervision: Safety issues/verbal cues  FIM - Upper Body Dressing/Undressing Upper body dressing/undressing steps patient completed: Put head through opening of pull over shirt/dress;Thread/unthread left sleeve of pullover shirt/dress;Thread/unthread right sleeve of pullover shirt/dresss;Pull shirt over trunk Upper body dressing/undressing: 5: Set-up assist to: Obtain clothing/put away FIM - Lower Body Dressing/Undressing Lower body dressing/undressing steps patient completed: Thread/unthread right pants leg;Thread/unthread left pants leg;Pull pants up/down Lower body dressing/undressing: 4: Min-Patient completed 75 plus %  of tasks  FIM - Toileting Toileting steps completed by patient: Adjust clothing prior to toileting;Performs perineal hygiene;Adjust clothing after toileting Toileting Assistive Devices: Grab bar or rail for support Toileting: 5: Supervision: Safety issues/verbal cues  FIM - Diplomatic Services operational officer Devices: Walker;Elevated toilet seat Toilet Transfers: 5-To toilet/BSC: Supervision (verbal  cues/safety issues);5-From toilet/BSC: Supervision (verbal cues/safety issues)  FIM - Banker Devices: Walker;Bed rails Bed/Chair Transfer: 3: Bed > Chair or W/C: Mod A (lift or lower assist);3: Chair or W/C > Bed: Mod A (lift or lower assist)  FIM - Locomotion: Wheelchair Locomotion: Wheelchair: 1: Total Assistance/staff pushes wheelchair (Pt<25%) FIM - Locomotion: Ambulation Locomotion: Ambulation Assistive Devices: Designer, industrial/product Ambulation/Gait Assistance: 5: Supervision Locomotion: Ambulation: 4: Travels 150 ft or more with minimal assistance (Pt.>75%)  Comprehension Comprehension Mode: Auditory Comprehension: 7-Follows complex conversation/direction: With no assist  Expression Expression Mode: Verbal Expression: 7-Expresses complex ideas: With no assist  Social Interaction Social Interaction: 7-Interacts appropriately with others - No medications needed.  Problem Solving Problem Solving: 7-Solves complex problems: Recognizes & self-corrects  Memory Memory: 7-Complete Independence: No helper  Medical Problem List and Plan:  1. 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 DVT Prophylaxis/Anticoagulation: Coumadin for DVT prophylaxis. Monitor for any bleeding episodes Check vascular study  3. Pain Management: Robaxin and Dilaudid as needed. Add low dose oxycontin 4. Neuropsych: This patient is capable of making decisions on his own behalf.  5. Acute blood loss anemia. Followup hemoglobin improved 6.Ileus. D/C reglan, establish softener, laxative program once stooling decreases  7. Obstructive sleep apnea. Continue CPAP  8. Hyperlipidemia, mild, supplement potassium, on HCT Z.  9. Leukocytosis without fever. Will monitor. The incisions look great  LOS (Days) 5 A FACE TO FACE EVALUATION WAS PERFORMED  Aneta Hendershott E 12/27/2012, 8:18 AM

## 2012-12-27 NOTE — Progress Notes (Signed)
ANTICOAGULATION CONSULT NOTE - Follow Up Consult  Pharmacy Consult for coumadin Indication: VTE prophylaxis  No Known Allergies  Patient Measurements: Height: 6\' 1"  (185.4 cm) Weight: 255 lb 1.6 oz (115.713 kg) IBW/kg (Calculated) : 79.9 Heparin Dosing Weight:   Vital Signs: Temp: 97.8 F (36.6 C) (06/30 0555) Temp src: Oral (06/30 0555) BP: 120/71 mmHg (06/30 0555) Pulse Rate: 92 (06/30 0555)  Labs:  Recent Labs  12/25/12 0535 12/26/12 0615 12/26/12 0851 12/27/12 0550  HGB  --   --  9.1*  --   HCT  --   --  28.5*  --   PLT  --   --  445*  --   LABPROT 18.9* 20.5*  --  20.9*  INR 1.63* 1.82*  --  1.86*    Estimated Creatinine Clearance: 113.4 ml/min (by C-G formula based on Cr of 0.9).   Medications:  Scheduled:  . atorvastatin  40 mg Oral q1800  . enoxaparin (LOVENOX) injection  30 mg Subcutaneous BID  . feeding supplement  237 mL Oral BID  . hydrochlorothiazide  12.5 mg Oral Daily  . latanoprost  1 drop Both Eyes QHS  . lisinopril  20 mg Oral Daily  . OxyCODONE  10 mg Oral Q12H  . polyethylene glycol  17 g Oral Daily  . potassium chloride  10 mEq Oral BID  . Warfarin - Pharmacist Dosing Inpatient   Does not apply q1800   Infusions:    Assessment: 62 yo male s/p TKA is currently on subtherapeutic coumadin for VTE prophylaxis.  INR didn't change much and now is at 1.86.  Doses were charted these past few days.  Also on lovenox 30mg  sq q12h. Goal of Therapy:  INR 2-3 Monitor platelets by anticoagulation protocol: Yes   Plan:  1) Coumadin 10mg  po x1. Patient may need 10mg  few days a week and the rest 7.5mg   2) Daily PT/INR 3) LMWH 30 q12 until INR > 2   Yecenia Dalgleish, [name] 12/27/2012,8:34 AM

## 2012-12-27 NOTE — Progress Notes (Addendum)
Physical Therapy Session Note  Patient Details  Name: Kent Ryan MRN: [id number] Date of Birth: [birth year]  Today's Date: 12/27/2012 Time: 1005-1130 and 5621-3086 Time Calculation (min): 85 min and 51 min  Short Term Goals: Week 1:  PT Short Term Goal 1 (Week 1): Patient will perform sit<>stand transfers and stand pivot transfers with RW and min assist. PT Short Term Goal 2 (Week 1): Patient will perform bed mobility from flat surface with min assist. PT Short Term Goal 3 (Week 1): Patient will negotiate 5 steps with one handrail and min assist. PT Short Term Goal 4 (Week 1): Patient will negotiate ramp with RW and min assist.  Skilled Therapeutic Interventions/Progress Updates:   Pt now without R KI secondary to improved SLR.  Pt reporting that this weekend he began exercising more and self massage in quad muscles but patient reporting increased pain in bilat knees and increased spasms.  Pt medicated at beginning of session; at end of session ice applied and RN notified for muscle relaxer medication.  Pt performed sit > stand from recliner with mod A to maintain forwards weight shift to bring LE under COG.  Performed ambulation in controlled environment x 150' with RW and min A for endurance and strengthening with focus on increased hamstring activation at terminal stance and knee flexion during swing phase instead of straight leg advancement.  Once in gym pt seated on edge of mat performed 20 reps bilat ankle pumps.  Performed sit > supine on mat with mod A to bring bilat LE onto bed.  In supine performed bilat LE 10 reps each quad/glute sets with pillow under foot, hip IR, hip ABD and knee flexion heel slides with intermittent rest breaks for breathing, water and rest.  Pt trembling from pain and muscle spasms.  Returned to sitting EOB with mod A and transferred stand pivot to w/c with RW and mod A from elevated surface.  Returned to room in w/c and performed stand pivot w/c > bed and sit >  supine mod A.  Ice packs placed bilat knees.  PM session:  Pt reporting improved pain this pm. Performed w/c mobility x 150' x 2 reps with supervision in controlled environment for UE strengthening and endurance.  Performed stand pivot mat <> w/c with RW and min A.  At edge of mat performed 13 reps each R and LLE heel slides (with foot on maxi slide to reduce friction) with isometric hold for knee flexion ROM and hamstring strengthening, 10 reps hip ADD/IR pillow squeezes, 10 reps R and LLE LAQ for quad and knee extension strengthening and heel<>toe raises in sitting for gastroc and anterior tib strengthening with intermittent rest breaks.  To stand from mat pt flexed knees to current end range and focused on transitioning from knee flexion > knee extension during sit > stand without backing feet up but bringing COG over BOS from elevated mat with min A.  Ice packs placed bilat knees in w/c at end of session.  Pt to stay up in w/c and fold clean clothes before dinner.    Therapy Documentation Precautions:  Precautions Precautions: Knee;Fall Precaution Comments: No KI on L LE, able to perform SLR with <10 degree lag on 12/23/12 Required Braces or Orthoses: Knee Immobilizer - Right Knee Immobilizer - Right: Discontinue once straight leg raise with < 10 degree lag Knee Immobilizer - Left: Discontinue once straight leg raise with < 10 degree lag Restrictions Weight Bearing Restrictions: Yes Other Position/Activity Restrictions: WBAT bilaterally Pain:  Pain Assessment Pain Assessment: 0-10 Pain Score:   7 Pain Type: Acute pain Pain Location: Knee Pain Orientation: Right;Left Pain Descriptors / Indicators: Aching;Discomfort Pain Frequency: Constant Pain Onset: On-going Pain Intervention(s): Medication (See eMAR) Locomotion : Ambulation Ambulation/Gait Assistance: 4: Min guard   See FIM for current functional status  Therapy/Group: Individual Therapy  Edman Circle [facility] 12/27/2012, 12:37  PM

## 2012-12-28 ENCOUNTER — Inpatient Hospital Stay (HOSPITAL_COMMUNITY): Payer: BC Managed Care – PPO | Admitting: Occupational Therapy

## 2012-12-28 ENCOUNTER — Inpatient Hospital Stay (HOSPITAL_COMMUNITY): Payer: BC Managed Care – PPO | Admitting: Physical Therapy

## 2012-12-28 DIAGNOSIS — M1710 Unilateral primary osteoarthritis, unspecified knee: Secondary | ICD-10-CM

## 2012-12-28 DIAGNOSIS — D62 Acute posthemorrhagic anemia: Secondary | ICD-10-CM

## 2012-12-28 DIAGNOSIS — Z96659 Presence of unspecified artificial knee joint: Secondary | ICD-10-CM

## 2012-12-28 DIAGNOSIS — K560 Paralytic ileus: Secondary | ICD-10-CM

## 2012-12-28 MED ORDER — WARFARIN SODIUM 7.5 MG PO TABS
7.5000 mg | ORAL_TABLET | Freq: Once | ORAL | Status: AC
  Administered 2012-12-28: 7.5 mg via ORAL
  Filled 2012-12-28: qty 1

## 2012-12-28 NOTE — Progress Notes (Signed)
[facility] CONSULT NOTE - Follow Up Consult  Pharmacy Consult for coumadin Indication: VTE prophylaxis  No Known Allergies  Patient Measurements: Height: 6\' 1"  (185.4 cm) Weight: 255 lb 1.6 oz (115.713 kg) IBW/kg (Calculated) : 79.9 Heparin Dosing Weight:   Vital Signs: Temp: 98.5 F (36.9 C) (07/01 0450) Temp src: Oral (07/01 0450) BP: 109/61 mmHg (07/01 0450) Pulse Rate: 88 (07/01 0450)  Labs:  Recent Labs  12/26/12 0615 12/26/12 0851 12/27/12 0550 12/28/12 0525  HGB  --  9.1*  --   --   HCT  --  28.5*  --   --   PLT  --  445*  --   --   LABPROT 20.5*  --  20.9* 22.1*  INR 1.82*  --  1.86* 2.00*    Estimated Creatinine Clearance: 113.4 ml/min (by C-G formula based on Cr of 0.9).   Medications:  Scheduled:  . atorvastatin  40 mg Oral q1800  . enoxaparin (LOVENOX) injection  30 mg Subcutaneous BID  . feeding supplement  237 mL Oral BID  . hydrochlorothiazide  12.5 mg Oral Daily  . latanoprost  1 drop Both Eyes QHS  . lisinopril  20 mg Oral Daily  . OxyCODONE  10 mg Oral Q12H  . polyethylene glycol  17 g Oral Daily  . potassium chloride  10 mEq Oral BID  . Warfarin - Pharmacist Dosing Inpatient   Does not apply q1800   Infusions:    Assessment: 62 yo male s/p TKA is currently on coumadin for VTE prophylaxis.  INR now at goal.   Doses were charted these past few days.  Also on lovenox 30mg  sq q12h until INR>2.    Goal of Therapy:  INR 2-3 Monitor platelets by [facility] protocol: Yes   Plan:  1) Coumadin 7.5mg  po x1. 2) Daily PT/INR 3) Cont LMWH 30 q12 until INR > 2   Wendie Simmer, PharmD, BCPS Clinical Pharmacist  Pager: [phone]

## 2012-12-28 NOTE — Progress Notes (Signed)
Occupational Therapy Session Note  Patient Details  Name: Kent Ryan MRN: [id number] Date of Birth: 12-Nov-1950  Today's Date: 12/28/2012 Time: 0932-1030 and 1335-1400 Time Calculation (min): 58 min and 25 min  Short Term Goals: Week 1:  OT Short Term Goal 1 (Week 1): Pt will complete stand pivot transfer to toilet with min assist with necessary AD OT Short Term Goal 2 (Week 1): Pt will complete LB bathing with min assist with necessary AE OT Short Term Goal 3 (Week 1): Pt will complete LB dressing wiht min assist with necessary AE OT Short Term Goal 4 (Week 1): Pt will complete shower transfer with min assist with necessary AD OT Short Term Goal 5 (Week 1): Pt will tolerate standing for 5 mins to complete grooming tasks  Skilled Therapeutic Interventions/Progress Updates:    1) Pt seen for ADL retraining with focus on bed mobility, functional mobility with RW, standing tolerance, and use of AE to assist with self-care tasks of bathing and dressing. Pt completed bed mobility with SBA this session and increased time. Pt doffed socks in sitting on elevated toilet seat with reacher.  Pt ambulated to walk-in shower with supervision. Bathing completed in standing with RW for UE support and demonstrated appropriate use of long handled sponge to wash feet and back. Close supervision in shower for safety. Pt completed dressing tasks from [facility]. Assistance provided to don TEDS and socks. Grooming conducted in standing at sink. Ambulated to recliner and positioned with all needs in reach and fresh ice packs applied to knees.  2) Pt seen for 1:1 OT with focus on functional mobility with RW and discussion of homemaking goals.  Pt's son present during session with questions regarding d/c plan and necessary care post d/c.  Discussed modified independent goals with son and potential need for setup with higher level ADLs.  Pt ambulated to toilet with close supervision and requested to perform additional walking.   Ambulated approx 100 feet with RW and close supervision.  Returned to recliner with ice packing applied to knees.  Therapy Documentation Precautions:  Precautions Precautions: Knee;Fall Precaution Comments: Bilat KI have been D/C; no pillows under knees; may have under calves Required Braces or Orthoses: Knee Immobilizer - Right Knee Immobilizer - Right: Other (comment) (Discontinued) Knee Immobilizer - Left: Other (comment) (Discontinued) Restrictions Weight Bearing Restrictions: Yes Other Position/Activity Restrictions: WBAT bilaterally Pain: Pain Assessment Pain Assessment: 0-10 Pain Score:   7 Pain Type: Surgical pain Pain Location: Knee Pain Orientation: Right Pain Descriptors / Indicators: Aching;Cramping Pain Onset: On-going Patients Stated Pain Goal: 4 Pain Intervention(s): Medication (See eMAR)  See FIM for current functional status  Therapy/Group: Individual Therapy  Leonette Monarch 12/28/2012, 12:28 PM

## 2012-12-28 NOTE — Progress Notes (Signed)
Social Work Patient ID: Gearldine Bienenstock, male   DOB: 15-Nov-1950, 62 y.o.   MRN: [id number] Spoke with Judie Bonus to give her a verbal update regarding pt's progress and current levels.  Awaiting auth

## 2012-12-28 NOTE — Progress Notes (Signed)
Physical Therapy Session Note  Patient Details  Name: Kent Ryan MRN: [id number] Date of Birth: 1950-09-03  Today's Date: 12/28/2012 Time: 0932-1030 and 1400-1455 Time Calculation (min): 58 min and 55 min  Short Term Goals: Week 1:  PT Short Term Goal 1 (Week 1): Patient will perform sit<>stand transfers and stand pivot transfers with RW and min assist. PT Short Term Goal 2 (Week 1): Patient will perform bed mobility from flat surface with min assist. PT Short Term Goal 3 (Week 1): Patient will negotiate 5 steps with one handrail and min assist. PT Short Term Goal 4 (Week 1): Patient will negotiate ramp with RW and min assist.  Skilled Therapeutic Interventions/Progress Updates:   Pt reporting improved pain this am and improved rest last night.  Performed sit <> stand from recliner with supervision-min A; performed gait x 150' x 2 reps in controlled environment with RW and supervision-min A with verbal cues for knee flexion at terminal stance > knee extension and heel strike at initial stance.  Second gait repetition at end of session changed to black, wide heavy duty RW secondary to pt heavy WB through bilat UE and wide stance.  On edge of mat performed bilat passive stretching into knee flexion with prolonged hold and addition of 4 reps contract relax with intermittent rest breaks.  Following stretching performed stair negotiation training up/down 3 short steps (4" tall) with 2 rails and min-mod A with step to sequencing with pt turning laterally to descend to minimize knee flexion range.  Returned to room and set up in recliner for lunch with bilat ice packs placed for pain and edema management.  PM session: Pt resting in recliner, just received muscle relaxer from RN.  Pt performed sit > stand from recliner with min A.  Performed gait in controlled environment x 150' x 2 with RW and supervision with intermittent verbal cues for knee flexion during terminal stance for increased clearance  during swing phase.  On edge of mat performed gradual, prolonged hip flexor and quad stretching reclined on wedge with bilat LE off edge of mat beginning with feet unsupported and then placing box under feet for support while removing wedge progressively working towards supine position. Returned to sitting and transferred from mat > Nustep with RW and min A.  Set up on Nustep and performed AAROM of bilat knee flexion <> extension and ankle ROM on Nustep with use of bilat UE for progressive stretching x 2:00 minutes holding stretch at end range.  Returned to room and transferred sit > supine in bed with mod A and +2 A and bed rails to reposition closer to [facility].  Ice packs placed bilat knees for pain and edema management.         Therapy Documentation Precautions:  Precautions Precautions: Knee;Fall Precaution Comments: Bilat KI have been D/C; no pillows under knees; may have under calves Required Braces or Orthoses: Knee Immobilizer - Right Knee Immobilizer - Right: Other (comment) (Discontinued) Knee Immobilizer - Left: Other (comment) (Discontinued) Restrictions Weight Bearing Restrictions: Yes Other Position/Activity Restrictions: WBAT bilaterally Pain: Pain Assessment Pain Assessment: 0-10 Pain Score:   4 Pain Type: Surgical pain;Acute pain Pain Location: Knee Pain Orientation: Right;Left Pain Descriptors / Indicators: Discomfort;Guarding;Sharp;Sore;Spasm Pain Onset: With Activity Patients Stated Pain Goal: 4 Pain Intervention(s): Cold applied;Repositioned;Rest Locomotion : Ambulation Ambulation/Gait Assistance: 4: Min guard   See FIM for current functional status  Therapy/Group: Individual Therapy  Edman Circle [facility] 12/28/2012, 12:28 PM

## 2012-12-28 NOTE — Progress Notes (Signed)
Patient ID: Kent Ryan, male   DOB: Jul 30, 1950, 62 y.o.   MRN: [id number]  Subjective/Complaints: 62 y.o. right-handed male admitted 12/12/2012 with end-stage degenerative changes of both knees. No change with conservative care. Patient independent prior to admission. Underwent bilateral total knee replacements 12/13/2012 per Dr. Despina Hick. Placed on Coumadin for DVT prophylaxis and advised weightbearing as tolerated. Postoperative pain management with epidural removed 12/15/2012. Acute blood loss anemia with latest hemoglobin 7.4 and monitored. Bouts of nausea vomiting with abdominal films 12/16/2012 showing ileus and placed on Reglan when necessary and repeat abdominal films 12/21/2012 with minimal improvement of colonic ileus   Sleep ok, on long acting   Review of Systems  Musculoskeletal: Positive for joint pain.  All other systems reviewed and are negative.    Objective: Vital Signs: Blood pressure 109/61, pulse 88, temperature 98.5 F (36.9 C), temperature source Oral, resp. rate 17, height 6\' 1"  (1.854 m), weight 115.713 kg (255 lb 1.6 oz), SpO2 96.00%. No results found. Results for orders placed during the hospital encounter of 12/22/12 (from the past 72 hour(s))  PROTIME-INR     Status: Abnormal   Collection Time    12/26/12  6:15 AM      Result Value Range   Prothrombin Time 20.5 (*) 11.6 - 15.2 seconds   INR 1.82 (*) 0.00 - 1.49  CBC     Status: Abnormal   Collection Time    12/26/12  8:51 AM      Result Value Range   WBC 12.4 (*) 4.0 - 10.5 K/uL   RBC 3.36 (*) 4.22 - 5.81 MIL/uL   Hemoglobin 9.1 (*) 13.0 - 17.0 g/dL   HCT 14.7 (*) 82.9 - 56.2 %   MCV 84.8  78.0 - 100.0 fL   MCH 27.1  26.0 - 34.0 pg   MCHC 31.9  30.0 - 36.0 g/dL   RDW 13.0  86.5 - 78.4 %   Platelets 445 (*) 150 - 400 K/uL  PROTIME-INR     Status: Abnormal   Collection Time    12/27/12  5:50 AM      Result Value Range   Prothrombin Time 20.9 (*) 11.6 - 15.2 seconds   INR 1.86 (*) 0.00 - 1.49   PROTIME-INR     Status: Abnormal   Collection Time    12/28/12  5:25 AM      Result Value Range   Prothrombin Time 22.1 (*) 11.6 - 15.2 seconds   INR 2.00 (*) 0.00 - 1.49        Assessment/Plan: 1. 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 Physiatrist is providing close team supervision and 24 hour management of active medical problems listed below. Physiatrist and rehab team continue to assess barriers to discharge/monitor patient progress toward functional and medical goals. FIM: FIM - Bathing Bathing Steps Patient Completed: Chest;Right Arm;Left Arm;Abdomen;Front perineal area;Buttocks;Right upper leg;Left upper leg;Right lower leg (including foot);Left lower leg (including foot) Bathing: 5: Supervision: Safety issues/verbal cues  FIM - Upper Body Dressing/Undressing Upper body dressing/undressing steps patient completed: Thread/unthread right sleeve of front closure shirt/dress;Thread/unthread left sleeve of front closure shirt/dress;Pull shirt around back of front closure shirt/dress;Button/unbutton shirt Upper body dressing/undressing: 5: Set-up assist to: Obtain clothing/put away FIM - Lower Body Dressing/Undressing Lower body dressing/undressing steps patient completed: Thread/unthread right pants leg;Thread/unthread left pants leg;Pull pants up/down;Thread/unthread right underwear leg;Thread/unthread left underwear leg;Pull underwear  up/down Lower body dressing/undressing: 4: Min-Patient completed 75 plus % of tasks  FIM - Toileting Toileting steps completed by patient: Adjust clothing prior to toileting;Performs perineal hygiene;Adjust clothing after toileting Toileting Assistive Devices: Grab bar or rail for support Toileting: 5: Supervision: Safety issues/verbal cues  FIM - Quarry manager Devices: Walker;Elevated toilet seat Toilet Transfers: 5-To toilet/BSC: Supervision (verbal cues/safety issues);5-From toilet/BSC: Supervision (verbal cues/safety issues)  FIM - Banker Devices: Walker;Bed rails;Arm rests Bed/Chair Transfer: 3: Supine > Sit: Mod A (lifting assist/Pt. 50-74%/lift 2 legs;3: Sit > Supine: Mod A (lifting assist/Pt. 50-74%/lift 2 legs);3: Bed > Chair or W/C: Mod A (lift or lower assist);3: Chair or W/C > Bed: Mod A (lift or lower assist)  FIM - Locomotion: Wheelchair Locomotion: Wheelchair: 1: Total Assistance/staff pushes wheelchair (Pt<25%) FIM - Locomotion: Ambulation Locomotion: Ambulation Assistive Devices: Designer, industrial/product Ambulation/Gait Assistance: 4: Min guard Locomotion: Ambulation: 4: Travels 150 ft or more with minimal assistance (Pt.>75%)  Comprehension Comprehension Mode: Auditory Comprehension: 7-Follows complex conversation/direction: With no assist  Expression Expression Mode: Verbal Expression: 7-Expresses complex ideas: With no assist  Social Interaction Social Interaction: 7-Interacts appropriately with others - No medications needed.  Problem Solving Problem Solving: 7-Solves complex problems: Recognizes & self-corrects  Memory Memory: 7-Complete Independence: No helper  Medical Problem List and Plan:  1. 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 DVT Prophylaxis/Anticoagulation: Coumadin for DVT prophylaxis. Monitor for any bleeding episodes Check vascular study  3. Pain Management: Robaxin and Dilaudid as needed. Add low dose oxycontin 4. Neuropsych: This patient is capable of making decisions on his own behalf.  5. Acute blood loss anemia. Followup hemoglobin improved 6.Ileus. D/C reglan, establish softener, laxative program once stooling decreases  7. Obstructive sleep apnea. Continue CPAP  8. Hyperlipidemia,  mild, supplement potassium, on HCT Z.  9. Leukocytosis without fever. Will monitor. The incisions look great  LOS (Days) 6 A FACE TO FACE EVALUATION WAS PERFORMED  Farren Nelles E 12/28/2012, 8:37 AM

## 2012-12-29 ENCOUNTER — Inpatient Hospital Stay (HOSPITAL_COMMUNITY): Payer: BC Managed Care – PPO | Admitting: Occupational Therapy

## 2012-12-29 ENCOUNTER — Inpatient Hospital Stay (HOSPITAL_COMMUNITY): Payer: BC Managed Care – PPO | Admitting: Physical Therapy

## 2012-12-29 MED ORDER — WARFARIN SODIUM 7.5 MG PO TABS
7.5000 mg | ORAL_TABLET | Freq: Once | ORAL | Status: AC
  Administered 2012-12-29: 7.5 mg via ORAL
  Filled 2012-12-29: qty 1

## 2012-12-29 NOTE — Progress Notes (Signed)
Patient ID: Kent Ryan, male   DOB: [birth year], 62 y.o.   MRN: [id number]  Subjective/Complaints: 62 y.o. right-handed male admitted 12/12/2012 with end-stage degenerative changes of both knees. No change with conservative care. Patient independent prior to admission. Underwent bilateral total knee replacements 12/13/2012 per Dr. Despina Hick. Placed on Coumadin for DVT prophylaxis and advised weightbearing as tolerated. Postoperative pain management with epidural removed 12/15/2012. Acute blood loss anemia with latest hemoglobin 7.4 and monitored. Bouts of nausea vomiting with abdominal films 12/16/2012 showing ileus and placed on Reglan when necessary and repeat abdominal films 12/21/2012 with minimal improvement of colonic ileus. Abd complaints have resolved  Sleep ok, on long acting pain med Upt to 75 degrees on CPM   Review of Systems  Musculoskeletal: Positive for joint pain.  All other systems reviewed and are negative.    Objective: Vital Signs: Blood pressure 110/68, pulse 100, temperature 98.5 F (36.9 C), temperature source Oral, resp. rate 17, height 6\' 1"  (1.854 m), weight 115.713 kg (255 lb 1.6 oz), SpO2 99.00%. No results found. Results for orders placed during the hospital encounter of 12/22/12 (from the past 72 hour(s))  PROTIME-INR     Status: Abnormal   Collection Time    12/27/12  5:50 AM      Result Value Range   Prothrombin Time 20.9 (*) 11.6 - 15.2 seconds   INR 1.86 (*) 0.00 - 1.49  PROTIME-INR     Status: Abnormal   Collection Time    12/28/12  5:25 AM      Result Value Range   Prothrombin Time 22.1 (*) 11.6 - 15.2 seconds   INR 2.00 (*) 0.00 - 1.49  PROTIME-INR     Status: Abnormal   Collection Time    12/29/12  6:30 AM      Result Value Range   Prothrombin Time 24.3 (*) 11.6 - 15.2 seconds   INR 2.27 (*) 0.00 - 1.49        Assessment/Plan: 1. 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 Physiatrist is providing close team supervision and 24 hour management of active medical problems listed below. Physiatrist and rehab team continue to assess barriers to discharge/monitor patient progress toward functional and medical goals. FIM: FIM - Bathing Bathing Steps Patient Completed: Chest;Right Arm;Left Arm;Abdomen;Front perineal area;Buttocks;Right upper leg;Left upper leg;Right lower leg (including foot);Left lower leg (including foot) Bathing: 5: Supervision: Safety issues/verbal cues  FIM - Upper Body Dressing/Undressing Upper body dressing/undressing steps patient completed: Thread/unthread right sleeve of pullover shirt/dresss;Thread/unthread left sleeve of pullover shirt/dress;Put head through opening of pull over shirt/dress;Pull shirt over trunk Upper body dressing/undressing: 5: Set-up assist to: Obtain clothing/put away FIM - Lower Body Dressing/Undressing Lower body dressing/undressing steps patient completed: Thread/unthread right pants leg;Thread/unthread left pants leg;Pull pants up/down;Thread/unthread right underwear leg;Thread/unthread left underwear leg;Pull underwear up/down Lower body dressing/undressing: 4: Min-Patient completed 75 plus % of tasks  FIM - Toileting Toileting steps completed by patient: Adjust clothing prior to toileting;Performs perineal hygiene;Adjust clothing after toileting Toileting Assistive Devices: Grab bar or rail for support Toileting: 5: Supervision: Safety issues/verbal cues  FIM - Diplomatic Services operational officer Devices: Walker;Elevated toilet seat Toilet Transfers: 5-To toilet/BSC: Supervision (verbal cues/safety issues);5-From toilet/BSC: Supervision (verbal cues/safety issues)  FIM - Banker Devices: Walker;Arm rests;HOB elevated Bed/Chair Transfer: 4: Bed > Chair or W/C: Min  A (steadying Pt. >  75%);4: Chair or W/C > Bed: Min A (steadying Pt. > 75%);3: Sit > Supine: Mod A (lifting assist/Pt. 50-74%/lift 2 legs)  FIM - Locomotion: Wheelchair Locomotion: Wheelchair: 0: Activity did not occur FIM - Locomotion: Ambulation Locomotion: Ambulation Assistive Devices: Designer, industrial/product Ambulation/Gait Assistance: 5: Supervision Locomotion: Ambulation: 5: Travels 150 ft or more with supervision/safety issues  Comprehension Comprehension Mode: Auditory Comprehension: 7-Follows complex conversation/direction: With no assist  Expression Expression Mode: Verbal Expression: 7-Expresses complex ideas: With no assist  Social Interaction Social Interaction: 7-Interacts appropriately with others - No medications needed.  Problem Solving Problem Solving: 7-Solves complex problems: Recognizes & self-corrects  Memory Memory: 7-Complete Independence: No helper  Medical Problem List and Plan:  1. 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 DVT Prophylaxis/Anticoagulation: Coumadin for DVT prophylaxis. Monitor for any bleeding episodes Check vascular study  3. Pain Management: Robaxin and Dilaudid as needed. Add low dose oxycontin 4. Neuropsych: This patient is capable of making decisions on his own behalf.  5. Acute blood loss anemia. Followup hemoglobin improved 6.Ileus. D/C reglan, establish softener, laxative program once stooling decreases  7. Obstructive sleep apnea. Continue CPAP  8. Hyperlipidemia, mild, supplement potassium, on HCT Z.  9. Leukocytosis without fever. Will monitor. The incisions look great  LOS (Days) 7 A FACE TO FACE EVALUATION WAS PERFORMED  KIRSTEINS,ANDREW E 12/29/2012, 9:34 AM

## 2012-12-29 NOTE — Progress Notes (Signed)
Occupational Therapy Session Note  Patient Details  Name: Kent Ryan MRN: [id number] Date of Birth: 1951/01/10  Today's Date: 12/29/2012 Time: 7829-5621 and 3086-5784 Time Calculation (min): 54 min and 41 min  Short Term Goals: Week 1:  OT Short Term Goal 1 (Week 1): Pt will complete stand pivot transfer to toilet with min assist with necessary AD OT Short Term Goal 2 (Week 1): Pt will complete LB bathing with min assist with necessary AE OT Short Term Goal 3 (Week 1): Pt will complete LB dressing wiht min assist with necessary AE OT Short Term Goal 4 (Week 1): Pt will complete shower transfer with min assist with necessary AD OT Short Term Goal 5 (Week 1): Pt will tolerate standing for 5 mins to complete grooming tasks  Skilled Therapeutic Interventions/Progress Updates:    1) Pt seen for ADL retraining with focus on use of AE to assist in self-care tasks.  Pt completed bed mobility with SBA this session and increased time. Pt doffed socks in sitting on elevated toilet seat with reacher. Pt ambulated to walk-in shower with supervision. Bathing completed in standing with RW for UE support and demonstrated appropriate use of long handled sponge to wash feet and back. Close supervision in shower for safety. Pt completed dressing tasks from [facility]. Assistance provided to don TEDS. Pt return demonstrated use of sock aid to don socks with increased time and use of reacher to adjust socks.  Pt also demonstrated ability to reach towards foot to adjust sock on LLE.  Grooming conducted in standing at sink.  Son present at end of session, discussed potential purchase of AE kit to complete LB dressing.  2) Pt seen for 1:1 OT with focus on functional mobility with RW in home environment and walk-in shower transfers.  Upon arrival, pt reports need to toilet.  Ambulated to toilet where pt voided in standing with distant supervision.  Ambulated >150 feet to ADL apartment to engage in shower transfer.  Use of 3"  simulated walk-in shower with education on stepping in backwards into shower to improve safety and RW placement to assist in transfer.  Discussed placement of grab bars for shower.  Pt reports son will visit house and measure or take pictures to provide for this therapist for further practice.  Pt min assist with stepping over 3" ledge both backwards and forwards.  Pt reports had BSC already for wife and was using it PTA over toilet to increase toilet height.  Pt ambulated back to room and returned to recliner with all needs in reach.  Therapy Documentation Precautions:  Precautions Precautions: Knee;Fall Precaution Comments: Bilat KI have been D/C; no pillows under knees; may have under calves Required Braces or Orthoses: Knee Immobilizer - Right Knee Immobilizer - Right: Other (comment) (Discontinued) Knee Immobilizer - Left: Other (comment) (Discontinued) Restrictions Weight Bearing Restrictions: Yes Other Position/Activity Restrictions: WBAT bilaterally General:   Vital Signs: Therapy Vitals Pulse Rate: 100 BP: 110/68 mmHg Pain: Pain Assessment Pain Assessment: 0-10 Pain Score: 6  Pain Type: Surgical pain Pain Location: Knee Pain Orientation: Right;Left Pain Descriptors / Indicators: Aching Pain Frequency: Intermittent Pain Onset: On-going Patients Stated Pain Goal: 3 Pain Intervention(s): Medication (See eMAR)  See FIM for current functional status  Therapy/Group: Individual Therapy  Leonette Monarch 12/29/2012, 10:45 AM

## 2012-12-29 NOTE — Progress Notes (Signed)
ANTICOAGULATION CONSULT NOTE - Follow Up Consult  Pharmacy Consult for coumadin Indication: VTE prophylaxis  No Known Allergies  Labs:  Recent Labs  12/27/12 0550 12/28/12 0525 12/29/12 0630  LABPROT 20.9* 22.1* 24.3*  INR 1.86* 2.00* 2.27*    Estimated Creatinine Clearance: 113.4 ml/min (by C-G formula based on Cr of 0.9).   Medications:  Scheduled:  . atorvastatin  40 mg Oral q1800  . feeding supplement  237 mL Oral BID  . hydrochlorothiazide  12.5 mg Oral Daily  . latanoprost  1 drop Both Eyes QHS  . lisinopril  20 mg Oral Daily  . OxyCODONE  10 mg Oral Q12H  . polyethylene glycol  17 g Oral Daily  . potassium chloride  10 mEq Oral BID  . warfarin  7.5 mg Oral ONCE-1800  . Warfarin - Pharmacist Dosing Inpatient   Does not apply q1800    Assessment: 62 yo male s/p TKA is currently on coumadin for VTE prophylaxis.  INR now at goal.     Goal of Therapy:  INR 2-3 Monitor platelets by anticoagulation protocol: Yes   Plan:  1) Coumadin 7.5mg  po x1. 2) Daily PT/INR 3) Lovenox dced  Thank you. Okey Regal, PharmD [phone]

## 2012-12-29 NOTE — Progress Notes (Signed)
Social Work Patient ID: Kent Ryan, male   DOB: [birth year], 62 y.o.   MRN: [id number] Met with pt to inform team conference goals-mod/i level and discharge 7/10.  He reports: " I finally feel like I am turning the corner." He expressed his family is working on getting his wife in a SNF until he is healed enough to provide care to her.  He is working on his progress and  glad he is progressing.  Will continue to work on discharge needs.

## 2012-12-29 NOTE — Patient Care Conference (Signed)
Inpatient RehabilitationTeam Conference and Plan of Care Update Date: 12/29/2012   Time: 11:15 AM    Patient Name: Kent Ryan      Medical Record Number: [id number]  Date of Birth: April 16, 1951 Sex: Male         Room/Bed: 4036/4036-01 Payor Info: Payor: BLUE CROSS BLUE [employer]IELD / Plan: BCBS [place] PPO / Product Type: *No Product type* /    Admitting Diagnosis: B TKR  Admit Date/Time:  12/22/2012 12:35 PM Admission Comments: No comment available   Primary Diagnosis:  Status post bilateral knee replacements Principal Problem: Status post bilateral knee replacements  Patient Active Problem List   Diagnosis Date Noted  . Status post bilateral knee replacements 12/22/2012  . Ileus, postoperative 12/16/2012  . Postoperative anemia due to acute blood loss 12/16/2012  . Hyponatremia 12/16/2012  . OA (osteoarthritis) of knee 12/13/2012    Expected Discharge Date: Expected Discharge Date: 01/06/13  Team Members Present: Physician leading conference: Dr. Claudette Laws Social Worker Present: Dossie Der, LCSW Nurse Present: Rosalio Macadamia, RN PT Present: Edman Circle, PT;Caroline Adriana Simas, PT;Other (comment) Clarisse Gouge Ripa-PT) OT Present: Other (comment);Leonette Monarch, Felipa Eth, OT (Kayla Perkinson-OT) SLP Present: Feliberto Gottron, SLP Other (Discipline and Name): marie Noel-PPS     Current Status/Progress Goal Weekly Team Focus  Medical   ROM slowly improving, pain controll better  achieve 90 degrees on CPM prior to D/C  Cont strengthening and ROM   Bowel/Bladder   Pt continent of bowel and bladder  Remain contient  Continue to Monitor   Swallow/Nutrition/ Hydration     na        ADL's   supervision overall  Mod I overall  AE use for LB self-care tasks, higher level ADLs   Mobility   Mod A bed mobility, supervision-min A basic transfers, gait, stairs  Mod I overall  ROM, strength, transfers and gait   Communication     na        Safety/Cognition/ Behavioral Observations    na         Pain   Pt c/o pain in bilateral knees  Pt will rate pain less than 6 with current medication regimen  Continue to monitor    Skin   n/a            *See Care Plan and progress notes for long and short-term goals.  Barriers to Discharge: still requiring physical assist    Possible Resolutions to Barriers:  cont rehab    Discharge Planning/Teaching Needs:  HOme with wife who can not assist-w/c bound.  Family to be in and out, pt needs to be mod/i to return home      Team Discussion:  ROM better-progressing slowly.  Turning the corner in flexion and range.  Needs to be mod/i to return home.  Revisions to Treatment Plan:  None   Continued Need for Acute Rehabilitation Level of Care: The patient requires daily medical management by a physician with specialized training in physical medicine and rehabilitation for the following conditions: Daily direction of a multidisciplinary physical rehabilitation program to ensure safe treatment while eliciting the highest outcome that is of practical value to the patient.: Yes Daily medical management of patient stability for increased activity during participation in an intensive rehabilitation regime.: Yes Daily analysis of laboratory values and/or radiology reports with any subsequent need for medication adjustment of medical intervention for : Post surgical problems  Zahid Carneiro, Lemar Livings 12/30/2012, 9:09 AM

## 2012-12-29 NOTE — Progress Notes (Signed)
Physical Therapy Weekly Progress Note  Patient Details  Name: Kent Ryan MRN: [id number] Date of Birth: 02-21-1951  Today's Date: 12/29/2012 Time: 4098-1191 and 4782-9562 Time Calculation (min): 44 min and 65 min  Patient has made good progress towards PT LTG and has met 2 of 4 short term goals.  Pt is currently supervision for bed mobility, supervision for basic transfers and gait with RW in controlled environment on level surfaces, min A to negotiate stairs with 1-2 rails and supervision w/c mobility in controlled environment.    Patient continues to demonstrate the following deficits: bilat LE weakness, impaired AROM and PROM needed for bed mobility, basic transfers, gait and stairs, impaired activity tolerance/endurance, impaired dynamic standing balance, gait and therefore will continue to benefit from skilled PT intervention to enhance overall performance with activity tolerance, balance, functional use of  right lower extremity and left lower extremity and LE strength and ROM for transfers and gait.  Patient progressing toward long term goals..  Plan of care revisions: Stair, car transfer and community ambulation goal downgraded to supervision and ramp goal added for home entry/exit.  PT Short Term Goals Week 1:  PT Short Term Goal 1 (Week 1): Patient will perform sit<>stand transfers and stand pivot transfers with RW and min assist. PT Short Term Goal 1 - Progress (Week 1): Met PT Short Term Goal 2 (Week 1): Patient will perform bed mobility from flat surface with min assist. PT Short Term Goal 2 - Progress (Week 1): Met PT Short Term Goal 3 (Week 1): Patient will negotiate 5 steps with one handrail and min assist. PT Short Term Goal 3 - Progress (Week 1): Partly met PT Short Term Goal 4 (Week 1): Patient will negotiate ramp with RW and min assist. PT Short Term Goal 4 - Progress (Week 1): Progressing toward goal Week 2:  PT Short Term Goal 1 (Week 2): = LTG of mod I  overall  Skilled Therapeutic Interventions/Progress Updates:   Pt resting in recliner.  Performed sit <> stand from recliner and performed ambulation in controlled environment x 150' with RW and supervision with verbal cues for upright trunk posture and full hip extension and knee flexion at terminal stance and swing phase to heel strike at initial stance.  Performed bilat LE AAROM into knee flexion with UE assisting on Nustep x 8 minutes total with pt able to achieve 67 deg knee flexion.  Intermittent rest breaks to hydrate with water.  Performed toileting in standing with UE support on grab bar and RW with supervision.  Returned to room and to recliner for lunch.  Pt to rest in bed after pm OT session prior to pm PT session.     PM session: pt resting in bed; daughter present to observe therapy.  Performed supine > sit with supervision and sit > stand from bed with increased knee flexion and supervision.  Performed ambulation in controlled environment x 150' x 2 reps with RW and supervision with verbal cues for upright posture and knee flexion during swing phase.  Seated at edge of mat performed PROM to bilat knees into flexion with contract-relax and prolonged hold and activation of hamstrings for AAROM for knee flexion.  Also performed 20-30 reps seated heel and toe raises and 10 reps hip IR/ADD pillow squeezes.  Following stretches performed stair negotiation training to simulate home entry with one rail on R.  Pt performed up/down one step 6" tall with R rail laterally with step to sequence with min A  with visual demonstration first and assistance for controlled descent.  Returning to room patient requested to use bathroom; pt stood to urinate with UE support on RW with supervision; performed retro stepping out of bathroom with RW and supervision.  Returned to room and to bed sit > supine on flat bed, no rail with supervision.  Ice packs placed bilat knees for pain and edema management.    Therapy  Documentation Precautions:  Precautions Precautions: Knee;Fall Precaution Comments: Bilat KI have been D/C; no pillows under knees; may have under calves Required Braces or Orthoses: Knee Immobilizer - Right Knee Immobilizer - Right: Other (comment) (Discontinued) Knee Immobilizer - Left: Other (comment) (Discontinued) Restrictions Weight Bearing Restrictions: Yes Other Position/Activity Restrictions: WBAT bilaterally Vital Signs: Therapy Vitals Temp: 98.4 F (36.9 C) Temp src: Oral Pulse Rate: 110 Resp: 16 BP: 119/83 mmHg Patient Position, if appropriate: Sitting Oxygen Therapy SpO2: 98 % O2 Device: None (Room air) Pain:  9/10 in bilat knees with ROM; sharp, discomfort, throbbing.  Ice packs placed bilat knees and RN notified for pain medication      See FIM for current functional status  Therapy/Group: Individual Therapy  Edman Circle [facility] 12/29/2012, 5:09 PM

## 2012-12-30 ENCOUNTER — Inpatient Hospital Stay (HOSPITAL_COMMUNITY): Payer: BC Managed Care – PPO | Admitting: Occupational Therapy

## 2012-12-30 ENCOUNTER — Inpatient Hospital Stay (HOSPITAL_COMMUNITY): Payer: BC Managed Care – PPO | Admitting: *Deleted

## 2012-12-30 ENCOUNTER — Inpatient Hospital Stay (HOSPITAL_COMMUNITY): Payer: BC Managed Care – PPO | Admitting: Physical Therapy

## 2012-12-30 LAB — PROTIME-INR
INR: 1.82 — ABNORMAL HIGH (ref 0.00–1.49)
Prothrombin Time: 20.5 seconds — ABNORMAL HIGH (ref 11.6–15.2)

## 2012-12-30 MED ORDER — GRX ANALGESIC BALM EX OINT
2.0000 g | TOPICAL_OINTMENT | Freq: Three times a day (TID) | CUTANEOUS | Status: DC
  Filled 2012-12-30: qty 28

## 2012-12-30 MED ORDER — WARFARIN SODIUM 10 MG PO TABS
10.0000 mg | ORAL_TABLET | Freq: Once | ORAL | Status: AC
  Administered 2012-12-30: 10 mg via ORAL
  Filled 2012-12-30: qty 1

## 2012-12-30 MED ORDER — MUSCLE RUB 10-15 % EX CREA
TOPICAL_CREAM | Freq: Three times a day (TID) | CUTANEOUS | Status: DC
  Administered 2012-12-30 – 2013-01-06 (×20): via TOPICAL
  Filled 2012-12-30 (×2): qty 85

## 2012-12-30 NOTE — Progress Notes (Signed)
ANTICOAGULATION CONSULT NOTE - Follow Up Consult  Pharmacy Consult for coumadin Indication: VTE prophylaxis  No Known Allergies  Labs:  Recent Labs  12/28/12 0525 12/29/12 0630 12/30/12 0603  LABPROT 22.1* 24.3* 20.5*  INR 2.00* 2.27* 1.82*    Estimated Creatinine Clearance: 109.8 ml/min (by C-G formula based on Cr of 0.9).   Medications:  Scheduled:  . atorvastatin  40 mg Oral q1800  . feeding supplement  237 mL Oral BID  . hydrochlorothiazide  12.5 mg Oral Daily  . latanoprost  1 drop Both Eyes QHS  . lisinopril  20 mg Oral Daily  . MUSCLE RUB   Topical TID  . OxyCODONE  10 mg Oral Q12H  . polyethylene glycol  17 g Oral Daily  . potassium chloride  10 mEq Oral BID  . Warfarin - Pharmacist Dosing Inpatient   Does not apply q1800    Assessment: 62 yo male s/p TKA is currently on coumadin for VTE prophylaxis.  INR slightly below goal today.     Goal of Therapy:  INR 2-3 Monitor platelets by anticoagulation protocol: Yes   Plan:  1) Coumadin 10 mg po x1. 2) Daily PT/INR  Wendie Simmer, PharmD, BCPS Clinical Pharmacist  Pager: [phone]

## 2012-12-30 NOTE — Progress Notes (Signed)
Occupational Therapy Weekly Progress Note  Patient Details  Name: Kent Ryan MRN: [id number] Date of Birth: [birth year]  Today's Date: 12/30/2012 Time: 4098-1191 Time Calculation (min): 55 min  Patient has met 5 of 5 short term goals.  Pt is making steady progress towards goals.  He is currently supervision with bed mobility and toilet transfers as well as bathing.  Pt requires min assist with walk-in shower transfer (with ledge) and LB dressing.  Utilizing long handled sponge, reacher, and sock aid to assist in LB self-care tasks.    Patient continues to demonstrate the following deficits: bilat LE weakness, impaired AROM and PROM needed for bed mobility and self-care tasks, basic transfers, functional mobility, impaired activity tolerance/endurance, impaired dynamic standing balance, and therefore will continue to benefit from skilled OT intervention to enhance overall performance with BADL, iADL and Reduce care partner burden.  Patient progressing toward long term goals..  Continue plan of care.  OT Short Term Goals Week 1:  OT Short Term Goal 1 (Week 1): Pt will complete stand pivot transfer to toilet with min assist with necessary AD OT Short Term Goal 1 - Progress (Week 1): Met OT Short Term Goal 2 (Week 1): Pt will complete LB bathing with min assist with necessary AE OT Short Term Goal 2 - Progress (Week 1): Met OT Short Term Goal 3 (Week 1): Pt will complete LB dressing wiht min assist with necessary AE OT Short Term Goal 3 - Progress (Week 1): Met OT Short Term Goal 4 (Week 1): Pt will complete shower transfer with min assist with necessary AD OT Short Term Goal 4 - Progress (Week 1): Met OT Short Term Goal 5 (Week 1): Pt will tolerate standing for 5 mins to complete grooming tasks OT Short Term Goal 5 - Progress (Week 1): Met Week 2:  OT Short Term Goal 1 (Week 2): STG = LTGs due to remaining LOS  Skilled Therapeutic Interventions/Progress Updates:    Pt seen for ADL  retraining with focus on use of AE to assist in self-care tasks. Pt completed bed mobility with supervision with bed rails. Pt doffed socks in sitting on elevated toilet seat with reacher. Pt ambulated to walk-in shower with supervision. Bathing completed in standing with RW for UE support and demonstrated appropriate use of long handled sponge to wash feet and back. Pt completed dressing tasks from [facility]. Assistance provided to don TEDS. Pt return demonstrated use of sock aid to don socks with increased time and use of reacher to adjust socks. Pt also demonstrated ability to reach towards foot to adjust sock on LLE. Grooming conducted in standing at sink. Distant supervision and setup assist provided throughout session.  Therapy Documentation Precautions:  Precautions Precautions: Knee;Fall Precaution Comments: Bilat KI have been D/C; no pillows under knees; may have under calves Required Braces or Orthoses: Knee Immobilizer - Right Knee Immobilizer - Right: Other (comment) (Discontinued) Knee Immobilizer - Left: Other (comment) (Discontinued) Restrictions Weight Bearing Restrictions: No Other Position/Activity Restrictions: WBAT bilaterally General:   Vital Signs: Therapy Vitals BP: 120/66 mmHg Pain: Pain Assessment Pain Score: 6  Pain Type: Surgical pain Pain Location: Knee Pain Orientation: Right;Left Pain Descriptors / Indicators: Aching Pain Onset: With Activity Pain Intervention(s): Repositioned;Ambulation/increased activity Multiple Pain Sites: No ADL: ADL Grooming: Supervision/safety Where Assessed-Grooming: Sitting at sink Upper Body Bathing: Supervision/safety Where Assessed-Upper Body Bathing: Shower Lower Body Bathing: Supervision/safety Where Assessed-Lower Body Bathing: Shower Upper Body Dressing: Setup Where Assessed-Upper Body Dressing: Chair Lower Body Dressing: Minimal assistance  Where Assessed-Lower Body Dressing: Chair Toileting: Supervision/safety Where  Assessed-Toileting: Teacher, adult education: Close supervision Statistician Method: Proofreader: Extra wide Psychiatric nurse: Minimal assistance Film/video editor Method: Designer, industrial/product: Grab bars (RW)  See FIM for current functional status  Therapy/Group: Individual Therapy  Leonette Monarch 12/30/2012, 11:52 AM

## 2012-12-30 NOTE — Progress Notes (Signed)
Physical Therapy Session Note  Patient Details  Name: Kent Ryan MRN: [id number] Date of Birth: October 24, 1950  Today's Date: 12/30/2012 Time: 0930-1000 Time Calculation (min): 30 min   Skilled Therapeutic Interventions/Progress Updates:    Ambulation 2 x 200' with RW supervision and cues for posture, decreased reliance on UEs. Cues for increased knee flexion during swing phase and improved quad set with heel strike.  Stairs x 2 with only one railing, sideways with min assist leading with the Lt.  Heel slides in sitting working towards increased knee flexion  Encouraged to position bil. LEs in extension with heel elevated when not in therapy to promote full extension.   Therapy Documentation Precautions:  Precautions Precautions: Knee;Fall Precaution Comments: Bilat KI have been D/C; no pillows under knees; may have under calves Required Braces or Orthoses: Knee Immobilizer - Right Knee Immobilizer - Right: Other (comment) (Discontinued) Knee Immobilizer - Left: Other (comment) (Discontinued) Restrictions Weight Bearing Restrictions: No Other Position/Activity Restrictions: WBAT bilaterally Pain: Pain Assessment Pain Score: 6  Pain Type: Surgical pain Pain Location: Knee Pain Orientation: Right;Left Pain Descriptors / Indicators: Aching Pain Onset: With Activity Pain Intervention(s): Repositioned;Ambulation/increased activity Multiple Pain Sites: No  See FIM for current functional status  Therapy/Group: Individual Therapy  Wilhemina Bonito 12/30/2012, 12:08 PM

## 2012-12-30 NOTE — Progress Notes (Signed)
Physical Therapy Session Note  Patient Details  Name: Kent Ryan MRN: [id number] Date of Birth: April 28, 1951  Today's Date: 12/30/2012 Time: 1030-1120 and 1400-1502 Time Calculation (min): 50 min and 62 min  Short Term Goals: Week 2:  PT Short Term Goal 1 (Week 2): = LTG of mod I overall  Skilled Therapeutic Interventions/Progress Updates:    AM Session: Patient received sitting in recliner. Session focused on gait training, curb and ramp negotiation, and B knee ROM. See details below for gait, curb, and ramp. Seated edge of mat, patient performed heel slides to improve B knee flexion (with pillow case under each foot to assist with ease of movement). Patient performed x15 heel slides on each LE, holding several repetitions for 20-30". ROM measurements taken: R knee: 73 degrees flexion L knee: 74 degrees flexion  Patient with requests to use restroom upon returning to room. Patient stood to urinate and performed all aspects of toileting with supervision. Patient left supine in bed with all needs within reach and ice packs on B knees.  PM Session: Patient received supine in bed. Patient with requests to use bathroom twice during session. Patient ambulated to bathroom with RW and supervision and stood to urinate and performed all aspects of toileting with supervision. Session focused on B knee ROM and B LE strengthening. Patient exercised on NuStep Level 2 x12' with B UE and B LE to facilitate self-ranging of B knees to improve ROM. Patient requires several rest breaks to hydrate. Patient performed x10 heel raises standing in front of set of stairs with B UE support on railing, requires supervision. Patient reports feeling light headed during exercise and requires seated rest break. Within 30", patient reports symptoms are gone and requests to resume exercises. Patient performed x10 mini squats with B UE on railing of stairs and requires supervision. Patient returned to room and left supine in bed  with all needs within reach and ice packs on B knees.  Therapy Documentation Precautions:  Precautions Precautions: Knee;Fall Precaution Comments: Bilat KI have been D/C; no pillows under knees; may have under calves Required Braces or Orthoses: Knee Immobilizer - Right Knee Immobilizer - Right: Other (comment) (Discontinued) Knee Immobilizer - Left: Other (comment) (Discontinued) Restrictions Weight Bearing Restrictions: No Other Position/Activity Restrictions: WBAT bilaterally Pain: Pain Assessment Pain Score: 6  Pain Type: Surgical pain Pain Location: Knee Pain Orientation: Right;Left Pain Descriptors / Indicators: Aching Pain Onset: With Activity Pain Intervention(s): Repositioned;Ambulation/increased activity Multiple Pain Sites: No Locomotion : Ambulation Ambulation: Yes Ambulation/Gait Assistance: 5: Supervision Ambulation Distance (Feet): 205 Feetx4 Assistive device: Rolling walker Ambulation/Gait Assistance Details: Verbal cues for gait pattern;Tactile cues for posture;Verbal cues for safe use of DME/AE Ambulation/Gait Assistance Details: 62' x4 (x2 in AM and x2 in PM) with RW and supervision. Verbal cues to increase B knee flexion and achieve heel strike. Gait Gait: Yes Gait Pattern: Wide base of support;Decreased step length - right;Decreased step length - left;Shuffle;Step-through pattern;Decreased stride length;Decreased hip/knee flexion - right;Decreased hip/knee flexion - left;Antalgic;Trunk flexed Stairs / Additional Locomotion Ramp: 5: Supervision (with RW) Curb: 5: Supervision (with RW)   See FIM for current functional status  Therapy/Group: Individual Therapy  Chipper Herb. Federico Maiorino, PT, DPT 12/30/2012, 12:28 PM

## 2012-12-30 NOTE — Progress Notes (Signed)
Patient ID: Kent Ryan, male   DOB: 01/30/1951, 62 y.o.   MRN: [id number]  Subjective/Complaints: 62 y.o. right-handed male admitted 12/12/2012 with end-stage degenerative changes of both knees. No change with conservative care. Patient independent prior to admission. Underwent bilateral total knee replacements 12/13/2012 per Dr. Despina Hick. Placed on Coumadin for DVT prophylaxis and advised weightbearing as tolerated. Postoperative pain management with epidural removed 12/15/2012. Acute blood loss anemia with latest hemoglobin 7.4 and monitored. Bouts of nausea vomiting with abdominal films 12/16/2012 showing ileus and placed on Reglan when necessary and repeat abdominal films 12/21/2012 with minimal improvement of colonic ileus. Abd complaints have resolved  R>L thigh tenderness   Review of Systems  Musculoskeletal: Positive for joint pain.  All other systems reviewed and are negative.    Objective: Vital Signs: Blood pressure 112/62, pulse 87, temperature 98.3 F (36.8 C), temperature source Oral, resp. rate 18, height 6\' 1"  (1.854 m), weight 108.138 kg (238 lb 6.4 oz), SpO2 98.00%. No results found. Results for orders placed during the hospital encounter of 12/22/12 (from the past 72 hour(s))  PROTIME-INR     Status: Abnormal   Collection Time    12/28/12  5:25 AM      Result Value Range   Prothrombin Time 22.1 (*) 11.6 - 15.2 seconds   INR 2.00 (*) 0.00 - 1.49  PROTIME-INR     Status: Abnormal   Collection Time    12/29/12  6:30 AM      Result Value Range   Prothrombin Time 24.3 (*) 11.6 - 15.2 seconds   INR 2.27 (*) 0.00 - 1.49  PROTIME-INR     Status: Abnormal   Collection Time    12/30/12  6:03 AM      Result Value Range   Prothrombin Time 20.5 (*) 11.6 - 15.2 seconds   INR 1.82 (*) 0.00 - 1.49    Head: Normocephalic.  Eyes: EOM are normal.  Neck: Normal range of motion. Neck supple. No thyromegaly present.  Cardiovascular: Normal rate and regular rhythm.   Pulmonary/Chest: Breath sounds normal. No respiratory distress.  Abdominal: Soft. Bowel sounds are hyperactive. Decreased distention. No pain Musculoskeletal:  Both knees CDI, no erythema.  No thigh or calf swelling  Neurological: He is alert and oriented to person, place, and time.  UE 5/5 LE 1+ to 2/5 HF, 1+ to 2 KE, 4/5 ankles. No sensory abnl  Skin:  Bilateral total knee replacements with dressing in place       Assessment/Plan: 1. 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 Physiatrist is providing close team supervision and 24 hour management of active medical problems listed below. Physiatrist and rehab team continue to assess barriers to discharge/monitor patient progress toward functional and medical goals. FIM: FIM - Bathing Bathing Steps Patient Completed: Chest;Right Arm;Left Arm;Abdomen;Front perineal area;Buttocks;Right upper leg;Left upper leg;Right lower leg (including foot);Left lower leg (including foot) Bathing: 5: Supervision: Safety issues/verbal cues  FIM - Upper Body Dressing/Undressing Upper body dressing/undressing steps patient completed: Thread/unthread right sleeve of pullover shirt/dresss;Thread/unthread left sleeve of pullover shirt/dress;Put head through opening of pull over shirt/dress;Pull shirt over trunk Upper body dressing/undressing: 5: Set-up assist to: Obtain clothing/put away FIM - Lower Body Dressing/Undressing Lower body dressing/undressing steps patient completed: Thread/unthread right pants leg;Thread/unthread left pants leg;Pull pants up/down;Don/Doff right sock;Don/Doff left sock Lower body dressing/undressing: 5: Set-up assist to: Don/Doff TED stocking  FIM - Toileting Toileting steps completed by patient: Adjust clothing prior to toileting;Performs perineal hygiene;Adjust  clothing after toileting Toileting Assistive Devices: Grab bar or rail for support Toileting: 5: Supervision: Safety issues/verbal cues  FIM - Diplomatic Services operational officer Devices: Art gallery manager Transfers: 5-To toilet/BSC: Supervision (verbal cues/safety issues);5-From toilet/BSC: Supervision (verbal cues/safety issues)  FIM - Press photographer Assistive Devices: Arm rests;Walker Bed/Chair Transfer: 5: Bed > Chair or W/C: Supervision (verbal cues/safety issues);5: Chair or W/C > Bed: Supervision (verbal cues/safety issues)  FIM - Locomotion: Wheelchair Locomotion: Wheelchair: 0: Activity did not occur FIM - Locomotion: Ambulation Locomotion: Ambulation Assistive Devices: Designer, industrial/product Ambulation/Gait Assistance: 5: Supervision Locomotion: Ambulation: 5: Travels 150 ft or more with supervision/safety issues  Comprehension Comprehension Mode: Auditory Comprehension: 7-Follows complex conversation/direction: With no assist  Expression Expression Mode: Verbal Expression: 7-Expresses complex ideas: With no assist  Social Interaction Social Interaction: 7-Interacts appropriately with others - No medications needed.  Problem Solving Problem Solving: 7-Solves complex problems: Recognizes & self-corrects  Memory Memory: 7-Complete Independence: No helper  Medical Problem List and Plan:  1. 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 DVT Prophylaxis/Anticoagulation: Coumadin for DVT prophylaxis. Monitor for any bleeding episodes Check vascular study  3. Pain Management: Robaxin and Dilaudid as needed. low dose oxycontin, analgesic balm for muscular pain in quads 4. Neuropsych: This patient is capable of making decisions on his own behalf.  5. Acute blood loss anemia. Followup hemoglobin improved 6.Ileus. D/C reglan, establish softener, laxative program once stooling decreases  7. Obstructive  sleep apnea. Continue CPAP  8. Hyperlipidemia, mild, supplement potassium, on HCT Z.  9. Leukocytosis without fever. Will monitor. The incisions look great  LOS (Days) 8 A FACE TO FACE EVALUATION WAS PERFORMED  Kent Ryan E 12/30/2012, 8:45 AM

## 2012-12-31 ENCOUNTER — Inpatient Hospital Stay (HOSPITAL_COMMUNITY): Payer: BC Managed Care – PPO | Admitting: Occupational Therapy

## 2012-12-31 ENCOUNTER — Inpatient Hospital Stay (HOSPITAL_COMMUNITY): Payer: BC Managed Care – PPO | Admitting: Physical Therapy

## 2012-12-31 LAB — PROTIME-INR
INR: 2.03 — ABNORMAL HIGH (ref 0.00–1.49)
Prothrombin Time: 22.3 seconds — ABNORMAL HIGH (ref 11.6–15.2)

## 2012-12-31 MED ORDER — WARFARIN SODIUM 10 MG PO TABS
10.0000 mg | ORAL_TABLET | Freq: Once | ORAL | Status: AC
  Administered 2012-12-31: 10 mg via ORAL
  Filled 2012-12-31: qty 1

## 2012-12-31 NOTE — Progress Notes (Signed)
ANTICOAGULATION CONSULT NOTE - Follow Up Consult  Pharmacy Consult for coumadin Indication: VTE prophylaxis  No Known Allergies  Labs:  Recent Labs  12/29/12 0630 12/30/12 0603 12/31/12 0500  LABPROT 24.3* 20.5* 22.3*  INR 2.27* 1.82* 2.03*    Estimated Creatinine Clearance: 109.8 ml/min (by C-G formula based on Cr of 0.9).   Medications:  Scheduled:  . atorvastatin  40 mg Oral q1800  . feeding supplement  237 mL Oral BID  . hydrochlorothiazide  12.5 mg Oral Daily  . latanoprost  1 drop Both Eyes QHS  . lisinopril  20 mg Oral Daily  . MUSCLE RUB   Topical TID  . OxyCODONE  10 mg Oral Q12H  . polyethylene glycol  17 g Oral Daily  . potassium chloride  10 mEq Oral BID  . Warfarin - Pharmacist Dosing Inpatient   Does not apply q1800    Assessment: 62 yo male s/p TKA is currently on coumadin for VTE prophylaxis.  INR is therapeutic today.  Goal of Therapy:  INR 2-3 Monitor platelets by anticoagulation protocol: Yes   Plan:  1) Coumadin 10 mg po x1. 2) Daily PT/INR  Estella Husk, Pharm.D., BCPS Clinical Pharmacist Phone: [phone] or [phone] Pager: [phone] 12/31/2012, 9:26 AM

## 2012-12-31 NOTE — Progress Notes (Signed)
Physical Therapy Note  Patient Details  Name: Vibhav Waddill MRN: [id number] Date of Birth: 1951-02-11 Today's Date: 12/31/2012  1100-1155 (55 minutes) individual Pain: 4/10 initially/ premedicated increased to 8/10 with exercise Focus of treatment: Therapeutic exercises focused on bilateral LE AROM (knee) / strengthening; gait training/endurance Treatment: Gait SBA with RW on unit (150+ feet); supine knee extension stretches with 5 second quad holds and towel under ankles; AA bilateral hip flexion with 30 second holds at end of range; bilateral hip flexion in supine using therapy ball; bilateral SLRs 2 X 5 with minimal quad lag; seated knee flexion using orange theraband 2 X 10 ; Nustep Level 2 X 7 minutes LEs only.    Erick Murin,JIM 12/31/2012, 11:44 AM

## 2012-12-31 NOTE — Progress Notes (Signed)
Patient ID: Kent Ryan, male   DOB: 04-16-1951, 62 y.o.   MRN: [id number]  Subjective/Complaints: 62 y.o. right-handed male admitted 12/12/2012 with end-stage degenerative changes of both knees. No change with conservative care. Patient independent prior to admission. Underwent bilateral total knee replacements 12/13/2012 per Dr. Despina Hick. Placed on Coumadin for DVT prophylaxis and advised weightbearing as tolerated. Postoperative pain management with epidural removed 12/15/2012. Acute blood loss anemia with latest hemoglobin 7.4 and monitored. Bouts of nausea vomiting with abdominal films 12/16/2012 showing ileus and placed on Reglan when necessary and repeat abdominal films 12/21/2012 with minimal improvement of colonic ileus. Abd complaints have resolved  Thigh pain improved with analgesic balm   Review of Systems  Musculoskeletal: Positive for joint pain.  All other systems reviewed and are negative.    Objective: Vital Signs: Blood pressure 103/68, pulse 100, temperature 98.6 F (37 C), temperature source Oral, resp. rate 18, height 6\' 1"  (1.854 m), weight 108.138 kg (238 lb 6.4 oz), SpO2 96.00%. No results found. Results for orders placed during the hospital encounter of 12/22/12 (from the past 72 hour(s))  PROTIME-INR     Status: Abnormal   Collection Time    12/29/12  6:30 AM      Result Value Range   Prothrombin Time 24.3 (*) 11.6 - 15.2 seconds   INR 2.27 (*) 0.00 - 1.49  PROTIME-INR     Status: Abnormal   Collection Time    12/30/12  6:03 AM      Result Value Range   Prothrombin Time 20.5 (*) 11.6 - 15.2 seconds   INR 1.82 (*) 0.00 - 1.49  PROTIME-INR     Status: Abnormal   Collection Time    12/31/12  5:00 AM      Result Value Range   Prothrombin Time 22.3 (*) 11.6 - 15.2 seconds   INR 2.03 (*) 0.00 - 1.49    Head: Normocephalic.  Eyes: EOM are normal.  Neck: Normal range of motion. Neck supple. No thyromegaly present.  Cardiovascular: Normal rate and regular  rhythm.  Pulmonary/Chest: Breath sounds normal. No respiratory distress.  Abdominal: Soft. Bowel sounds are hyperactive. Decreased distention. No pain Musculoskeletal:  Both knees CDI, no erythema.  No thigh or calf swelling  Neurological: He is alert and oriented to person, place, and time.  UE 5/5 LE 1+ to 2/5 HF, 1+ to 2 KE, 4/5 ankles. No sensory abnl  Skin:  Bilateral total knee replacements with dressing in place       Assessment/Plan: 1. 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 Physiatrist is providing close team supervision and 24 hour management of active medical problems listed below. Physiatrist and rehab team continue to assess barriers to discharge/monitor patient progress toward functional and medical goals. FIM: FIM - Bathing Bathing Steps Patient Completed: Chest;Right Arm;Left Arm;Abdomen;Front perineal area;Buttocks;Right upper leg;Left upper leg;Right lower leg (including foot);Left lower leg (including foot) Bathing: 5: Supervision: Safety issues/verbal cues  FIM - Upper Body Dressing/Undressing Upper body dressing/undressing steps patient completed: Thread/unthread right sleeve of pullover shirt/dresss;Thread/unthread left sleeve of pullover shirt/dress;Put head through opening of pull over shirt/dress;Pull shirt over trunk Upper body dressing/undressing: 5: Set-up assist to: Obtain clothing/put away FIM - Lower Body Dressing/Undressing Lower body dressing/undressing steps patient completed: Thread/unthread right pants leg;Thread/unthread left pants leg;Pull pants up/down;Don/Doff right sock;Don/Doff left sock Lower body dressing/undressing: 4: Min-Patient completed 75 plus %  of tasks  FIM - Toileting Toileting steps completed by patient: Adjust clothing prior to toileting;Performs perineal  hygiene;Adjust clothing after toileting Toileting Assistive Devices: Grab bar or rail for support Toileting: 5: Supervision: Safety issues/verbal cues  FIM - Diplomatic Services operational officer Devices: Walker;Elevated toilet seat Toilet Transfers: 5-To toilet/BSC: Supervision (verbal cues/safety issues);5-From toilet/BSC: Supervision (verbal cues/safety issues)  FIM - Press photographer Assistive Devices: Arm rests;Walker Bed/Chair Transfer: 5: Bed > Chair or W/C: Supervision (verbal cues/safety issues);5: Supine > Sit: Supervision (verbal cues/safety issues)  FIM - Locomotion: Wheelchair Locomotion: Wheelchair: 0: Activity did not occur FIM - Locomotion: Ambulation Locomotion: Ambulation Assistive Devices: Designer, industrial/product Ambulation/Gait Assistance: 5: Supervision Locomotion: Ambulation: 5: Travels 150 ft or more with supervision/safety issues  Comprehension Comprehension Mode: Auditory Comprehension: 7-Follows complex conversation/direction: With no assist  Expression Expression Mode: Verbal Expression: 7-Expresses complex ideas: With no assist  Social Interaction Social Interaction: 7-Interacts appropriately with others - No medications needed.  Problem Solving Problem Solving: 7-Solves complex problems: Recognizes & self-corrects  Memory Memory: 7-Complete Independence: No helper  Medical Problem List and Plan:  1. 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 DVT Prophylaxis/Anticoagulation: Coumadin for DVT prophylaxis. Monitor for any bleeding episodes Check vascular study  3. Pain Management: Robaxin and Dilaudid as needed. low dose oxycontin, analgesic balm for muscular pain in quads 4. Neuropsych: This patient is capable of making decisions on his own behalf.  5. Acute blood loss anemia. Followup hemoglobin improved 6.Ileus. Resolved                  7. Obstructive sleep apnea.  Continue CPAP  8. Hyperlipidemia, mild, supplement potassium, on HCT Z.  9. Leukocytosis without fever. Will monitor. The incisions look great  LOS (Days) 9 A FACE TO FACE EVALUATION WAS PERFORMED  KIRSTEINS,ANDREW E 12/31/2012, 10:58 AM

## 2012-12-31 NOTE — Progress Notes (Signed)
Occupational Therapy Session Note  Patient Details  Name: Kent Ryan MRN: [id number] Date of Birth: Dec 25, 1950  Today's Date: 12/31/2012 Time: 1324-4010 and 1330-1400 Time Calculation (min): 55 min and 30 min  Short Term Goals: Week 2:  OT Short Term Goal 1 (Week 2): STG = LTGs due to remaining LOS  Skilled Therapeutic Interventions/Progress Updates:    1) Pt seen for ADL retraining with focus on use of AE to assist in self-care tasks. Pt completed bed mobility with supervision with bed rails. Pt doffed socks in sitting on elevated toilet seat with reacher. Pt ambulated to level walk-in shower with supervision with RW. Completed bathing in standing with RW for UE support and demonstrated appropriate use of long handled sponge to wash feet and back, pt with increased hip flexion and able to reach further down legs with bathing. Pt completed dressing tasks from [facility]. Assistance provided to don TEDS. Pt return demonstrated use of sock aid to don socks with increased time and demonstrated ability to reach towards foot to adjust socks. Grooming conducted in standing at sink. Distant supervision and setup assist provided throughout session.   2) Pt seen for 1:1 OT with focus on functional mobility with RW with homemaking tasks.  Pt gathered clothing with RW and distant supervision and carried laundry bag in Rt hand while maintaining appropriate control of RW to laundry room.  Pt requested to focus on BLE exercises, engaged in foot glides with focus on knee flexion.  Therapy Documentation Precautions:  Precautions Precautions: Knee;Fall Precaution Comments: Bilat KI have been D/C; no pillows under knees; may have under calves Required Braces or Orthoses: Knee Immobilizer - Right Knee Immobilizer - Right: Other (comment) (Discontinued) Knee Immobilizer - Left: Other (comment) (Discontinued) Restrictions Weight Bearing Restrictions: Yes Other Position/Activity Restrictions: WBAT  bilaterally Pain: Pain Assessment Pain Assessment: 0-10 Pain Score: 6  Pain Type: Surgical pain Pain Location: Knee Pain Orientation: Right Pain Descriptors / Indicators: Aching;Sharp;Stabbing Pain Onset: On-going Patients Stated Pain Goal: 4 Pain Intervention(s): Medication (See eMAR) 2nd Pain Site Pain Location: Knee Pain Orientation: Left Pain Descriptors / Indicators: Aching Pain Onset: On-going Pain Intervention(s): Medication (See eMAR)  See FIM for current functional status  Therapy/Group: Individual Therapy  Leonette Monarch 12/31/2012, 10:29 AM

## 2012-12-31 NOTE — Plan of Care (Signed)
Problem: RH PAIN MANAGEMENT Goal: RH STG PAIN MANAGED AT OR BELOW PT'S PAIN GOAL Goal of 3 or less on scale 0-10  Outcome: Not Progressing Pain at 7-10 goal modified

## 2012-12-31 NOTE — Plan of Care (Signed)
Problem: RH PAIN MANAGEMENT Goal: RH STG PAIN MANAGED AT OR BELOW PT'S PAIN GOAL Goal of 3 or less on scale 0-10  Outcome: Not Progressing Still having a lot of pain

## 2012-12-31 NOTE — Progress Notes (Signed)
Physical Therapy Session Note  Patient Details  Name: Kent Ryan MRN: [id number] Date of Birth: 06-04-1951  Today's Date: 12/31/2012 Time: 1478-2956 Time Calculation (min): 47 min  Short Term Goals: Week 2:  PT Short Term Goal 1 (Week 2): = LTG of mod I overall  Skilled Therapeutic Interventions/Progress Updates:    Reviewed seated heel slides bil 5 x 10 second holds each.  Transitioned to supine to work on knee extension: supervision due to slow speed of movement.  In supine worked on quad sets with heel up on pillow and therapist's overpressure bil multiple reps x 30 seconds of overpressure at a time.  Sit to supine supervision, again due to slow speed of movement.  Stairs with bil hand rails on 4" step side going forward and sideways on 6" step side going sideways.  He is nervous to go forward on that side due to pain with increased knee flexion and weakness.  Gait with RW >150' with supervision.  Verbal cues for upright posture and good heel to toe pattern.  Pt able to stand at washer take his clothes out and put them in the dryer with the support of RW and supervision for safety.  I did hold the laundry room door open for him.  Pt positioned back in his bed to rest with ice packs on bil knees for pain and edema control.    Therapy Documentation Precautions:  Precautions Precautions: Knee;Fall Precaution Comments: reviewed knee precaution (no pillow) Required Braces or Orthoses: Knee Immobilizer - Right Knee Immobilizer - Right: Other (comment) (Discontinued) Knee Immobilizer - Left: Other (comment) (Discontinued) Restrictions Weight Bearing Restrictions: No (WBAT) RLE Weight Bearing: Weight bearing as tolerated LLE Weight Bearing: Weight bearing as tolerated Other Position/Activity Restrictions: WBAT bilaterally   Vital Signs: Therapy Vitals Temp: 98.7 F (37.1 C) Pulse Rate: 101 Resp: 18 BP: 103/62 mmHg Patient Position, if appropriate: Lying Oxygen Therapy SpO2: 96 %  (96) Pain: Pain Assessment Pain Assessment: 0-10 Pain Score: 6  Pain Type: Surgical pain Pain Location: Knee Pain Orientation: Right Pain Descriptors / Indicators: Aching;Cramping (aching in quads; muscle rub applied) Pain Onset: On-going Patients Stated Pain Goal: 4 Pain Intervention(s): Medication (See eMAR) 2nd Pain Site Pain Location: Knee Pain Orientation: Left Pain Descriptors / Indicators: Aching Pain Onset: On-going Pain Intervention(s): Medication (See eMAR)   Locomotion : Ambulation Ambulation/Gait Assistance: 5: Supervision   See FIM for current functional status  Therapy/Group: Individual Therapy  Lurena Joiner B. Rhilynn Preyer, PT, DPT [phone]   12/31/2012, 4:27 PM

## 2013-01-01 ENCOUNTER — Inpatient Hospital Stay (HOSPITAL_COMMUNITY): Payer: BC Managed Care – PPO | Admitting: Physical Therapy

## 2013-01-01 DIAGNOSIS — E871 Hypo-osmolality and hyponatremia: Secondary | ICD-10-CM

## 2013-01-01 DIAGNOSIS — D62 Acute posthemorrhagic anemia: Secondary | ICD-10-CM

## 2013-01-01 DIAGNOSIS — Z96659 Presence of unspecified artificial knee joint: Secondary | ICD-10-CM

## 2013-01-01 LAB — PROTIME-INR: Prothrombin Time: 23.2 seconds — ABNORMAL HIGH (ref 11.6–15.2)

## 2013-01-01 MED ORDER — WARFARIN SODIUM 7.5 MG PO TABS
7.5000 mg | ORAL_TABLET | ORAL | Status: DC
  Administered 2013-01-01 – 2013-01-04 (×3): 7.5 mg via ORAL
  Filled 2013-01-01 (×4): qty 1

## 2013-01-01 MED ORDER — WARFARIN SODIUM 10 MG PO TABS
10.0000 mg | ORAL_TABLET | ORAL | Status: DC
  Administered 2013-01-03 – 2013-01-05 (×2): 10 mg via ORAL
  Filled 2013-01-01 (×2): qty 1

## 2013-01-01 NOTE — Progress Notes (Signed)
Nursing Note: Pt tolerated 2 hour each leg in CPM machine.wbb

## 2013-01-01 NOTE — Progress Notes (Signed)
Physical Therapy Session Note  Patient Details  Name: Kent Ryan MRN: [id number] Date of Birth: 1950-11-15  Today's Date: 01/01/2013 Time: 1100-1200 Time Calculation (min): 60 min  Short Term Goals: Week 1:  PT Short Term Goal 1 (Week 1): Patient will perform sit<>stand transfers and stand pivot transfers with RW and min assist. PT Short Term Goal 1 - Progress (Week 1): Met PT Short Term Goal 2 (Week 1): Patient will perform bed mobility from flat surface with min assist. PT Short Term Goal 2 - Progress (Week 1): Met PT Short Term Goal 3 (Week 1): Patient will negotiate 5 steps with one handrail and min assist. PT Short Term Goal 3 - Progress (Week 1): Partly met PT Short Term Goal 4 (Week 1): Patient will negotiate ramp with RW and min assist. PT Short Term Goal 4 - Progress (Week 1): Progressing toward goal   Therapy Documentation Precautions:  Precautions Precautions: Knee;Fall Precaution Comments: reviewed knee precaution (no pillow) Required Braces or Orthoses: Knee Immobilizer - Right Knee Immobilizer - Right: Other (comment) (Discontinued) Knee Immobilizer - Left: Other (comment) (Discontinued) Restrictions Weight Bearing Restrictions: No (WBAT) RLE Weight Bearing: Weight bearing as tolerated LLE Weight Bearing: Weight bearing as tolerated Other Position/Activity Restrictions: WBAT bilaterally Pain: Pain Assessment Pain Score: 2  Pain Type: Surgical pain Pain Location: Knee Pain Orientation: Right;Left Pain Intervention(s): Medication (See eMAR)  Walking Group: 5 x 200' using RW with S/Mod-I with rest breaks throughout session. Stride length equal, non-antalgic, good foot clearance and no LOB noted.    Therapy/Group: Group Therapy  Almetta Liddicoat J 01/01/2013, 12:42 PM

## 2013-01-01 NOTE — Progress Notes (Addendum)
Kent Ryan is a 62 y.o. male 09/03/1950 [id number]  Subjective: No new complaints. No new problems. Slept well. Feeling OK.  Objective: Vital signs in last 24 hours: Temp:  [98.6 F (37 C)-98.7 F (37.1 C)] 98.6 F (37 C) (07/05 0556) Pulse Rate:  [91-101] 91 (07/05 0556) Resp:  [18] 18 (07/05 0556) BP: (103)/(62-64) 103/64 mmHg (07/05 0556) SpO2:  [93 %-96 %] 93 % (07/05 0556) Weight change:  Last BM Date: 12/31/12  Intake/Output from previous day: 07/04 0701 - 07/05 0700 In: 1080 [P.O.:1080] Out: 925 [Urine:925] Last cbgs: CBG (last 3)  No results found for this basename: GLUCAP,  in the last 72 hours   Physical Exam General: No apparent distress    HEENT: moist mucosa Lungs: Normal effort. Lungs clear to auscultation, no crackles or wheezes. Cardiovascular: Regular rate and rhythm, no edema Musculoskeletal:  No change from before Neurological: No new neurological deficits Wounds: N/A    Skin: clear Alert, cooperative   Lab Results: BMET    Component Value Date/Time   NA 137 12/23/2012 0530   K 3.4* 12/23/2012 0530   CL 104 12/23/2012 0530   CO2 27 12/23/2012 0530   GLUCOSE 96 12/23/2012 0530   BUN 9 12/23/2012 0530   CREATININE 0.90 12/23/2012 0530   CALCIUM 9.6 12/23/2012 0530   GFRNONAA 89* 12/23/2012 0530   GFRAA >90 12/23/2012 0530   CBC    Component Value Date/Time   WBC 12.4* 12/26/2012 0851   RBC 3.36* 12/26/2012 0851   HGB 9.1* 12/26/2012 0851   HCT 28.5* 12/26/2012 0851   PLT 445* 12/26/2012 0851   MCV 84.8 12/26/2012 0851   MCH 27.1 12/26/2012 0851   MCHC 31.9 12/26/2012 0851   RDW 13.9 12/26/2012 0851   LYMPHSABS 1.4 12/23/2012 0530   MONOABS 0.9 12/23/2012 0530   EOSABS 0.3 12/23/2012 0530   BASOSABS 0.0 12/23/2012 0530    Studies/Results: No results found.  Medications: I have reviewed the patient's current medications.  Assessment/Plan:   1. 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  DVT Prophylaxis/Anticoagulation: Coumadin for DVT prophylaxis. Monitor for any bleeding episodes Check vascular study  3. Pain Management: Robaxin and Dilaudid as needed. low dose oxycontin, analgesic balm for muscular pain in quads  4. Neuropsych: This patient is capable of making decisions on his own behalf.  5. Acute blood loss anemia. Followup hemoglobin improved - check CBC 6.Ileus. Resolved 7. Obstructive sleep apnea. Continue CPAP  8. Hyperlipidemia, mild, supplement potassium, on HCT Z.  9. Leukocytosis without fever. Will monitor. The incisions look great. 10. H/o abn CMET - recheck   Cont Rx    Length of stay, days: 10  Sonda Primes , MD 01/01/2013, 8:37 AM

## 2013-01-01 NOTE — Progress Notes (Signed)
ANTICOAGULATION CONSULT NOTE - Follow Up Consult  Pharmacy Consult for coumadin Indication: VTE prophylaxis  No Known Allergies  Labs:  Recent Labs  12/30/12 0603 12/31/12 0500 01/01/13 0528  LABPROT 20.5* 22.3* 23.2*  INR 1.82* 2.03* 2.14*    Estimated Creatinine Clearance: 109.8 ml/min (by C-G formula based on Cr of 0.9).   Medications:  Scheduled:  . atorvastatin  40 mg Oral q1800  . feeding supplement  237 mL Oral BID  . hydrochlorothiazide  12.5 mg Oral Daily  . latanoprost  1 drop Both Eyes QHS  . lisinopril  20 mg Oral Daily  . MUSCLE RUB   Topical TID  . OxyCODONE  10 mg Oral Q12H  . polyethylene glycol  17 g Oral Daily  . potassium chloride  10 mEq Oral BID  . Warfarin - Pharmacist Dosing Inpatient   Does not apply q1800    Assessment: 62 yo male s/p TKA is currently on coumadin for VTE prophylaxis.  INR is therapeutic today.  Goal of Therapy:  INR 2-3 Monitor platelets by anticoagulation protocol: Yes   Plan:  1) Coumadin 10 mg on M/W/F, 7.5mg  on T/Th/Sat/Sun 2) Daily PT/INR  Estella Husk, Pharm.D., BCPS, AAHIVP Clinical Pharmacist Phone: [phone] or [phone] 01/01/2013, 8:48 AM

## 2013-01-02 ENCOUNTER — Inpatient Hospital Stay (HOSPITAL_COMMUNITY): Payer: BC Managed Care – PPO | Admitting: Physical Therapy

## 2013-01-02 LAB — COMPREHENSIVE METABOLIC PANEL
AST: 24 U/L (ref 0–37)
BUN: 14 mg/dL (ref 6–23)
CO2: 26 mEq/L (ref 19–32)
Calcium: 10.8 mg/dL — ABNORMAL HIGH (ref 8.4–10.5)
Creatinine, Ser: 0.92 mg/dL (ref 0.50–1.35)
GFR calc non Af Amer: 89 mL/min — ABNORMAL LOW (ref >90)
Total Bilirubin: 0.6 mg/dL (ref 0.3–1.2)

## 2013-01-02 LAB — PROTIME-INR
INR: 2.36 — ABNORMAL HIGH (ref 0.00–1.49)
Prothrombin Time: 25 seconds — ABNORMAL HIGH (ref 11.6–15.2)

## 2013-01-02 LAB — CBC
Hemoglobin: 10.3 g/dL — ABNORMAL LOW (ref 13.0–17.0)
MCH: 26.6 pg (ref 26.0–34.0)
Platelets: 482 10*3/uL — ABNORMAL HIGH (ref 150–400)
RBC: 3.87 MIL/uL — ABNORMAL LOW (ref 4.22–5.81)
WBC: 6 10*3/uL (ref 4.0–10.5)

## 2013-01-02 NOTE — Progress Notes (Signed)
Physical Therapy Session Note  Patient Details  Name: Kent Ryan MRN: [id number] Date of Birth: Oct 13, 1950  Today's Date: 01/02/2013 Time: 1012-1112 Time Calculation (min): 60 min  Short Term Goals: Week 1:  PT Short Term Goal 1 (Week 1): Patient will perform sit<>stand transfers and stand pivot transfers with RW and min assist. PT Short Term Goal 1 - Progress (Week 1): Met PT Short Term Goal 2 (Week 1): Patient will perform bed mobility from flat surface with min assist. PT Short Term Goal 2 - Progress (Week 1): Met PT Short Term Goal 3 (Week 1): Patient will negotiate 5 steps with one handrail and min assist. PT Short Term Goal 3 - Progress (Week 1): Partly met PT Short Term Goal 4 (Week 1): Patient will negotiate ramp with RW and min assist. PT Short Term Goal 4 - Progress (Week 1): Progressing toward goal  Skilled Therapeutic Interventions/Progress Updates:    Pt was seen bedside in the am. Gait training: 250 feet x2 with rolling walker and S, step through gait pattern with wide BOS and slow cadence. In gym, performed LE there ex focusing on increasing strength and ROM B knees. 30 reps each for knee flex, quads sets, heel slides, ham sets, and knee flex/ext.   Therapy Documentation Precautions:  Precautions Precautions: Knee;Fall Precaution Comments: reviewed knee precaution (no pillow) Required Braces or Orthoses: Knee Immobilizer - Right Knee Immobilizer - Right: Other (comment) (Discontinued) Knee Immobilizer - Left: Other (comment) (Discontinued) Restrictions Weight Bearing Restrictions: No RLE Weight Bearing: Weight bearing as tolerated LLE Weight Bearing: Weight bearing as tolerated Other Position/Activity Restrictions: WBAT bilaterally General:   Pain: Pt complains of 7/10 pain, medicated prior to treatment.   Locomotion : Ambulation Ambulation/Gait Assistance: 5: Supervision   See FIM for current functional status  Therapy/Group: Individual  Therapy  Rayford Halsted 01/02/2013, 3:15 PM

## 2013-01-02 NOTE — Progress Notes (Signed)
Patient place cpap on himself and is tolerating well at this time. RT will continue to monitor.

## 2013-01-02 NOTE — Progress Notes (Signed)
Kent Ryan is a 62 y.o. male [birth year] [id number]  Subjective: No new complaints. No new problems. Slept well. Feeling OK.  Objective: Vital signs in last 24 hours: Temp:  [98.3 F (36.8 C)-98.8 F (37.1 C)] 98.3 F (36.8 C) (07/06 0606) Pulse Rate:  [93-104] 93 (07/06 0606) Resp:  [16-20] 16 (07/06 0606) BP: (109-113)/(61-72) 113/72 mmHg (07/06 0606) SpO2:  [96 %-97 %] 97 % (07/06 0606) Weight change:  Last BM Date: 01/01/13  Intake/Output from previous day: 07/05 0701 - 07/06 0700 In: 750 [P.O.:750] Out: 2575 [Urine:2575] Last cbgs: CBG (last 3)  No results found for this basename: GLUCAP,  in the last 72 hours   Physical Exam General: No apparent distress    HEENT: moist mucosa Lungs: Normal effort. Lungs clear to auscultation, no crackles or wheezes. Cardiovascular: Regular rate and rhythm, no edema Musculoskeletal:  No change from before Neurological: No new neurological deficits Wounds: N/A    Skin: clear Alert, cooperative   Lab Results: BMET    Component Value Date/Time   NA 137 12/23/2012 0530   K 3.4* 12/23/2012 0530   CL 104 12/23/2012 0530   CO2 27 12/23/2012 0530   GLUCOSE 96 12/23/2012 0530   BUN 9 12/23/2012 0530   CREATININE 0.90 12/23/2012 0530   CALCIUM 9.6 12/23/2012 0530   GFRNONAA 89* 12/23/2012 0530   GFRAA >90 12/23/2012 0530   CBC    Component Value Date/Time   WBC 12.4* 12/26/2012 0851   RBC 3.36* 12/26/2012 0851   HGB 9.1* 12/26/2012 0851   HCT 28.5* 12/26/2012 0851   PLT 445* 12/26/2012 0851   MCV 84.8 12/26/2012 0851   MCH 27.1 12/26/2012 0851   MCHC 31.9 12/26/2012 0851   RDW 13.9 12/26/2012 0851   LYMPHSABS 1.4 12/23/2012 0530   MONOABS 0.9 12/23/2012 0530   EOSABS 0.3 12/23/2012 0530   BASOSABS 0.0 12/23/2012 0530    Studies/Results: No results found.  Medications: I have reviewed the patient's current medications.  Assessment/Plan:   1. 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 DVT Prophylaxis/Anticoagulation: Coumadin for DVT prophylaxis. Monitor for any bleeding episodes Check vascular study  3. Pain Management: Robaxin and Dilaudid as needed. low dose oxycontin, analgesic balm for muscular pain in quads  4. Neuropsych: This patient is capable of making decisions on his own behalf.  5. Acute blood loss anemia. Followup hemoglobin improved - check CBC 6.Ileus. Resolved 7. Obstructive sleep apnea. Continue CPAP  8. Hyperlipidemia, mild, supplement potassium, on HCT Z.  9. Leukocytosis without fever. Will monitor. The incisions look great. 10. H/o abn CMET - recheck - pending...   Cont Rx    Length of stay, days: 11  Sonda Primes , MD 01/02/2013, 8:08 AM

## 2013-01-02 NOTE — Progress Notes (Signed)
ANTICOAGULATION CONSULT NOTE - Follow Up Consult  Pharmacy Consult for coumadin Indication: VTE prophylaxis  No Known Allergies  Labs:  Recent Labs  12/31/12 0500 01/01/13 0528 01/02/13 0536 01/02/13 0825  HGB  --   --   --  10.3*  HCT  --   --   --  32.2*  PLT  --   --   --  482*  LABPROT 22.3* 23.2* 25.0*  --   INR 2.03* 2.14* 2.36*  --   CREATININE  --   --   --  0.92    Estimated Creatinine Clearance: 107.4 ml/min (by C-G formula based on Cr of 0.92).   Medications:  Scheduled:  . atorvastatin  40 mg Oral q1800  . feeding supplement  237 mL Oral BID  . hydrochlorothiazide  12.5 mg Oral Daily  . latanoprost  1 drop Both Eyes QHS  . lisinopril  20 mg Oral Daily  . MUSCLE RUB   Topical TID  . OxyCODONE  10 mg Oral Q12H  . polyethylene glycol  17 g Oral Daily  . potassium chloride  10 mEq Oral BID  . warfarin  7.5 mg Oral Q T,Th,S,Su-1800   And  . [START ON 01/03/2013] warfarin  10 mg Oral Q M,W,F-1800  . Warfarin - Pharmacist Dosing Inpatient   Does not apply q1800    Assessment: 62 yo male s/p TKA is currently on coumadin for VTE prophylaxis.  INR remains therapeutic today with a slight upward trend.  Goal of Therapy:  INR 2-3 Monitor platelets by anticoagulation protocol: Yes   Plan:  1) Coumadin 10 mg on M/W/F, 7.5mg  on T/Th/Sat/Sun 2) Daily PT/INR - will consider decreasing frequency of INR checks once more stable  Estella Husk, Pharm.D., BCPS, AAHIVP Clinical Pharmacist Phone: [phone] or [phone] 01/02/2013, 9:50 AM

## 2013-01-03 ENCOUNTER — Inpatient Hospital Stay (HOSPITAL_COMMUNITY): Payer: BC Managed Care – PPO | Admitting: Physical Therapy

## 2013-01-03 ENCOUNTER — Inpatient Hospital Stay (HOSPITAL_COMMUNITY): Payer: BC Managed Care – PPO | Admitting: Occupational Therapy

## 2013-01-03 DIAGNOSIS — D62 Acute posthemorrhagic anemia: Secondary | ICD-10-CM

## 2013-01-03 DIAGNOSIS — M1710 Unilateral primary osteoarthritis, unspecified knee: Secondary | ICD-10-CM

## 2013-01-03 DIAGNOSIS — K560 Paralytic ileus: Secondary | ICD-10-CM

## 2013-01-03 DIAGNOSIS — Z96659 Presence of unspecified artificial knee joint: Secondary | ICD-10-CM

## 2013-01-03 LAB — PROTIME-INR
INR: 2.06 — ABNORMAL HIGH (ref 0.00–1.49)
Prothrombin Time: 22.6 seconds — ABNORMAL HIGH (ref 11.6–15.2)

## 2013-01-03 NOTE — Progress Notes (Signed)
Pt stated he will place himself on CPAP at bedtime.  RT checked equipment and CPAP is ready for patient use.

## 2013-01-03 NOTE — Progress Notes (Signed)
ANTICOAGULATION CONSULT NOTE - Follow Up Consult  Pharmacy Consult for coumadin Indication: VTE prophylaxis  No Known Allergies  Labs:  Recent Labs  01/01/13 0528 01/02/13 0536 01/02/13 0825 01/03/13 0520  HGB  --   --  10.3*  --   HCT  --   --  32.2*  --   PLT  --   --  482*  --   LABPROT 23.2* 25.0*  --  22.6*  INR 2.14* 2.36*  --  2.06*  CREATININE  --   --  0.92  --     Estimated Creatinine Clearance: 107.4 ml/min (by C-G formula based on Cr of 0.92).   Medications:  Scheduled:  . atorvastatin  40 mg Oral q1800  . feeding supplement  237 mL Oral BID  . hydrochlorothiazide  12.5 mg Oral Daily  . latanoprost  1 drop Both Eyes QHS  . lisinopril  20 mg Oral Daily  . MUSCLE RUB   Topical TID  . OxyCODONE  10 mg Oral Q12H  . polyethylene glycol  17 g Oral Daily  . potassium chloride  10 mEq Oral BID  . warfarin  7.5 mg Oral Q T,Th,S,Su-1800   And  . warfarin  10 mg Oral Q M,W,F-1800  . Warfarin - Pharmacist Dosing Inpatient   Does not apply q1800    Assessment: 62 yo male s/p TKA is currently on coumadin for VTE prophylaxis.  INR remains therapeutic today at 2.06, cbc stable, No bleeding noted per chart.   Goal of Therapy:  INR 2-3 Monitor platelets by anticoagulation protocol: Yes   Plan:  1) Coumadin 10 mg on M/W/F, 7.5mg  on T/Th/Sat/Sun 2) Daily PT/INR - will consider decreasing frequency of INR checks once more stable  Bayard Hugger, PharmD, BCPS  Clinical Pharmacist  Pager: [phone]   01/03/2013, 10:52 AM

## 2013-01-03 NOTE — Progress Notes (Signed)
Occupational Therapy Session Note  Patient Details  Name: Kent Ryan MRN: [id number] Date of Birth: 1950-09-06  Today's Date: 01/03/2013 Time: 0830-0928 and 1130-1200 Time Calculation (min): 58 min and 30 min  Short Term Goals: Week 2:  OT Short Term Goal 1 (Week 2): STG = LTGs due to remaining LOS  Skilled Therapeutic Interventions/Progress Updates:    1) Pt seen for ADL retraining with focus on increased independence with self-care tasks.  Pt overall distant supervision and setup assist this session. Pt demonstrating improved hip and knee flexion allowing him to complete bed mobility, transfers, and self-care tasks with increased independence.  Pt doffed and donned socks this session without use of reacher or sock aid.  Completed bathing with setup assist to obtain items and then modified independent with bathing.  Pt reports pain in Rt lower back post bathing, reports might have pulled something when washing back - applied muscle rub.  Pt returned to recliner and setup with ice packs on knees.  2) Pt seen for 1:1 OT with focus on walk-in shower transfer with stepping over 3" ledge.  Re-educated pt on shower transfer with stepping in backwards technique to increase safety.   Provided pt with handout with step by step directions for stepping backward in to shower and forward out of shower.  Pt return demonstrated transfer ~8 times with alternating leading with Rt leg and then Lt leg as neither leg is truly a "strong/weak" leg.  Ambulated back to room, where pt completed toileting at mod I level and then returned to recliner with ice packs placed on knees.  Therapy Documentation Precautions:  Precautions Precautions: Knee;Fall Precaution Comments: reviewed knee precaution (no pillow) Required Braces or Orthoses: Knee Immobilizer - Right Knee Immobilizer - Right: Other (comment) (Discontinued) Knee Immobilizer - Left: Other (comment) (Discontinued) Restrictions Weight Bearing Restrictions:  No RLE Weight Bearing: Weight bearing as tolerated LLE Weight Bearing: Weight bearing as tolerated Other Position/Activity Restrictions: WBAT bilaterally General:   Vital Signs: Therapy Vitals Temp: 98.4 F (36.9 C) Temp src: Oral Pulse Rate: 84 Resp: 16 BP: 105/69 mmHg Patient Position, if appropriate: Lying Oxygen Therapy SpO2: 96 % O2 Device: None (Room air) Pain: Pain Assessment Pain Assessment: 0-10 Pain Score: 5  Pain Type: Surgical pain Pain Location: Knee Pain Orientation: Right;Left Pain Descriptors / Indicators: Aching Pain Frequency: Intermittent Pain Onset: On-going Patients Stated Pain Goal: 3 Pain Intervention(s): Medication (See eMAR) Multiple Pain Sites: No  See FIM for current functional status  Therapy/Group: Individual Therapy  Leonette Monarch 01/03/2013, 9:45 AM

## 2013-01-03 NOTE — Progress Notes (Addendum)
Physical Therapy Session Note  Patient Details  Name: Kent Ryan MRN: [id number] Date of Birth: Dec 01, 1950  Today's Date: 01/03/2013 Time: 5621-3086 and 5784-6962 Time Calculation (min): 47 min and 54 min   Short Term Goals: Week 2:  PT Short Term Goal 1 (Week 2): = LTG of mod I overall  Skilled Therapeutic Interventions/Progress Updates:   Pt resting in recliner, denies pain in knees at rest but reports pulling a muscle in lateral, low back this am in shower.  Using muscle rub cream for pain management on low back.  Performed sit > stand with supervision.  Performed ambulation in controlled environment x 150' x 2 reps with RW and supervision with improved upright posture, decreased WB through UE and improved bilat knee flexion during swing phase of gait.  Performed 10 minutes AAROM on Nustep with UE assisting with alternating LE flexion ROM reaching knee flexion 76 deg on L, 78 deg on RLE.  Also performed Nustep at level 4 resistance without use of UE for LE extensor strengthening x 4 minutes.  Pt able to lift each LE and place on Nustep with supervision today.  Performed higher level gait training with R and L lateral stepping with RW x 50' each direction and discussed functional examples of when he would need to laterally step or retro step at home with RW.  Returned to room and performed transfer to toilet and all toileting tasks with supervision/mod I. Placed in recliner and ice packs placed bilat knees.  PM session: Pt using restroom with distant supervision.  Performed ambulation in controlled environment x 150' with RW and supervision but with increased trunk flexion and WB through UE this pm.  Pt still reporting pain in R lateral low back.  In gym demonstrated to patient how to perform seated lateral stretch of R side and low back; performed x 3 reps.  Also performed hip flexor and quad stretches in reclined position on edge of mat with wedge behind back and LE off edge of mat performing  PROM and contract relax for increased hip extension and knee flexion ROM.  Seated edge of mat performed 10-12 reps each R and L foot heel raises and toe raises, LAQ.  Pt able to perform sit > stand and bring COG over BOS without back feet up and LE extension without pulling with UE.  During ambulation back to room performed stair negotiation up/down 5 stairs 4-6 inches tall with 2 rails and supervision with patient still turning to descend laterally to minimize knee flexion and eccentric use of quads.  Performed toileting again mod I and returned to room to sit in recliner with disposable hot pack applied to R lateral lower back for muscular and pain relief.  Pt to visit in recliner and return to bed with nursing assistance.    Therapy Documentation Precautions:  Precautions Precautions: Knee;Fall Precaution Comments: reviewed knee precaution (no pillow) Required Braces or Orthoses: Knee Immobilizer - Right Knee Immobilizer - Right: Other (comment) (Discontinued) Knee Immobilizer - Left: Other (comment) (Discontinued) Restrictions Weight Bearing Restrictions: No RLE Weight Bearing: Weight bearing as tolerated LLE Weight Bearing: Weight bearing as tolerated Other Position/Activity Restrictions: WBAT bilaterally Pain: Pain Assessment Pain Assessment: No/denies pain Pain Score: 2  Locomotion : Ambulation Ambulation/Gait Assistance: 5: Supervision   See FIM for current functional status  Therapy/Group: Individual Therapy  Edman Circle [facility] 01/03/2013, 12:18 PM

## 2013-01-03 NOTE — Progress Notes (Signed)
Patient ID: Kent Ryan, male   DOB: [birth year], 62 y.o.   MRN: [id number]  Subjective/Complaints: 62 y.o. right-handed male admitted 12/12/2012 with end-stage degenerative changes of both knees. No change with conservative care. Patient independent prior to admission. Underwent bilateral total knee replacements 12/13/2012 per Dr. Despina Hick. Placed on Coumadin for DVT prophylaxis and advised weightbearing as tolerated. Postoperative pain management with epidural removed 12/15/2012. Acute blood loss anemia with latest hemoglobin 7.4 and monitored. Bouts of nausea vomiting with abdominal films 12/16/2012 showing ileus and placed on Reglan when necessary and repeat abdominal films 12/21/2012 with minimal improvement of colonic ileus. Abd complaints have resolved  Thigh pain improved , working on strength and ROM   Review of Systems  Musculoskeletal: Positive for joint pain.  All other systems reviewed and are negative.    Objective: Vital Signs: Blood pressure 105/69, pulse 84, temperature 98.4 F (36.9 C), temperature source Oral, resp. rate 16, height 6\' 1"  (1.854 m), weight 108.138 kg (238 lb 6.4 oz), SpO2 96.00%. No results found. Results for orders placed during the hospital encounter of 12/22/12 (from the past 72 hour(s))  PROTIME-INR     Status: Abnormal   Collection Time    01/01/13  5:28 AM      Result Value Range   Prothrombin Time 23.2 (*) 11.6 - 15.2 seconds   INR 2.14 (*) 0.00 - 1.49  PROTIME-INR     Status: Abnormal   Collection Time    01/02/13  5:36 AM      Result Value Range   Prothrombin Time 25.0 (*) 11.6 - 15.2 seconds   INR 2.36 (*) 0.00 - 1.49  COMPREHENSIVE METABOLIC PANEL     Status: Abnormal   Collection Time    01/02/13  8:25 AM      Result Value Range   Sodium 136  135 - 145 mEq/L   Potassium 4.0  3.5 - 5.1 mEq/L   Chloride 101  96 - 112 mEq/L   CO2 26  19 - 32 mEq/L   Glucose, Bld 136 (*) 70 - 99 mg/dL   BUN 14  6 - 23 mg/dL   Creatinine, Ser 7.82  0.50  - 1.35 mg/dL   Calcium 95.6 (*) 8.4 - 10.5 mg/dL   Total Protein 6.9  6.0 - 8.3 g/dL   Albumin 3.1 (*) 3.5 - 5.2 g/dL   AST 24  0 - 37 U/L   ALT 40  0 - 53 U/L   Alkaline Phosphatase 95  39 - 117 U/L   Total Bilirubin 0.6  0.3 - 1.2 mg/dL   GFR calc non Af Amer 89 (*) >90 mL/min   GFR calc Af Amer >90  >90 mL/min   Comment:            The eGFR has been calculated     using the CKD EPI equation.     This calculation has not been     validated in all clinical     situations.     eGFR's persistently     <90 mL/min signify     possible Chronic Kidney Disease.  CBC     Status: Abnormal   Collection Time    01/02/13  8:25 AM      Result Value Range   WBC 6.0  4.0 - 10.5 K/uL   RBC 3.87 (*) 4.22 - 5.81 MIL/uL   Hemoglobin 10.3 (*) 13.0 - 17.0 g/dL   HCT 21.3 (*) 08.6 - 57.8 %   MCV  83.2  78.0 - 100.0 fL   MCH 26.6  26.0 - 34.0 pg   MCHC 32.0  30.0 - 36.0 g/dL   RDW 21.3  08.6 - 57.8 %   Platelets 482 (*) 150 - 400 K/uL  PROTIME-INR     Status: Abnormal   Collection Time    01/03/13  5:20 AM      Result Value Range   Prothrombin Time 22.6 (*) 11.6 - 15.2 seconds   INR 2.06 (*) 0.00 - 1.49    Head: Normocephalic.  Eyes: EOM are normal.  Neck: Normal range of motion. Neck supple. No thyromegaly present.  Cardiovascular: Normal rate and regular rhythm.  Pulmonary/Chest: Breath sounds normal. No respiratory distress.  Abdominal: Soft. Bowel sounds are hyperactive. Decreased distention. No pain Musculoskeletal:  Both knees CDI, no erythema.  No thigh or calf swelling  Neurological: He is alert and oriented to person, place, and time.  UE 5/5 LE 1+ to 2/5 HF, 1+ to 2 KE, 4/5 ankles. No sensory abnl  Skin:  Bilateral total knee replacements with dressing in place    Assessment/Plan: 1. 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 Physiatrist is providing close team supervision and 24 hour management of active medical problems listed below. Physiatrist and rehab team continue to assess barriers to discharge/monitor patient progress toward functional and medical goals. FIM: FIM - Bathing Bathing Steps Patient Completed: Chest;Right Arm;Left Arm;Abdomen;Front perineal area;Buttocks;Right upper leg;Left upper leg;Right lower leg (including foot);Left lower leg (including foot) Bathing: 5: Set-up assist to: Obtain items  FIM - Upper Body Dressing/Undressing Upper body dressing/undressing steps patient completed: Thread/unthread right sleeve of pullover shirt/dresss;Thread/unthread left sleeve of pullover shirt/dress;Put head through opening of pull over shirt/dress;Pull shirt over trunk Upper body dressing/undressing: 5: Set-up assist to: Obtain clothing/put away FIM - Lower Body Dressing/Undressing Lower body dressing/undressing steps patient completed: Thread/unthread right pants leg;Thread/unthread left pants leg;Pull pants up/down;Don/Doff right sock;Don/Doff left sock Lower body dressing/undressing: 5: Set-up assist to: Don/Doff TED stocking  FIM - Toileting Toileting steps completed by patient: Adjust clothing prior to toileting;Performs perineal hygiene;Adjust clothing after toileting Toileting Assistive Devices: Grab bar or rail for support Toileting: 5: Set-up assist to: Obtain supplies  FIM - Diplomatic Services operational officer Devices: Walker;Elevated toilet seat Toilet Transfers: 5-To toilet/BSC: Supervision (verbal cues/safety issues);5-From toilet/BSC: Supervision (verbal cues/safety issues)  FIM - Banker Devices: Therapist, occupational: 5: Supine > Sit: Supervision (verbal cues/safety issues);5: Bed > Chair or W/C: Supervision (verbal cues/safety issues)  FIM - Locomotion: Wheelchair Locomotion: Wheelchair: 0: Activity did not occur FIM  - Locomotion: Ambulation Locomotion: Ambulation Assistive Devices: Designer, industrial/product Ambulation/Gait Assistance: 5: Supervision Locomotion: Ambulation: 5: Travels 150 ft or more with supervision/safety issues  Comprehension Comprehension Mode: Auditory Comprehension: 7-Follows complex conversation/direction: With no assist  Expression Expression Mode: Verbal Expression: 7-Expresses complex ideas: With no assist  Social Interaction Social Interaction: 7-Interacts appropriately with others - No medications needed.  Problem Solving Problem Solving: 7-Solves complex problems: Recognizes & self-corrects  Memory Memory: 7-Complete Independence: No helper  Medical Problem List and Plan:  1. 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 DVT Prophylaxis/Anticoagulation: Coumadin for DVT prophylaxis. Monitor for any bleeding episodes Check vascular study  3. Pain Management: Robaxin and Dilaudid as needed. low dose oxycontin, analgesic balm for muscular pain in  quads 4. Neuropsych: This patient is capable of making decisions on his own behalf.  5. Acute blood loss anemia. Followup hemoglobin improved 6.Ileus. Resolved                  7. Obstructive sleep apnea. Continue CPAP  8. Hyperlipidemia, mild, supplement potassium, on HCT Z.  9. Leukocytosis without fever. Will monitor. The incisions look great  LOS (Days) 12 A FACE TO FACE EVALUATION WAS PERFORMED  Paulina Muchmore E 01/03/2013, 9:40 AM

## 2013-01-04 ENCOUNTER — Inpatient Hospital Stay (HOSPITAL_COMMUNITY): Payer: BC Managed Care – PPO | Admitting: Physical Therapy

## 2013-01-04 ENCOUNTER — Inpatient Hospital Stay (HOSPITAL_COMMUNITY): Payer: BC Managed Care – PPO | Admitting: Occupational Therapy

## 2013-01-04 DIAGNOSIS — K560 Paralytic ileus: Secondary | ICD-10-CM

## 2013-01-04 DIAGNOSIS — M1710 Unilateral primary osteoarthritis, unspecified knee: Secondary | ICD-10-CM

## 2013-01-04 DIAGNOSIS — D62 Acute posthemorrhagic anemia: Secondary | ICD-10-CM

## 2013-01-04 DIAGNOSIS — Z96659 Presence of unspecified artificial knee joint: Secondary | ICD-10-CM

## 2013-01-04 LAB — PROTIME-INR
INR: 2.12 — ABNORMAL HIGH (ref 0.00–1.49)
Prothrombin Time: 23.1 seconds — ABNORMAL HIGH (ref 11.6–15.2)

## 2013-01-04 NOTE — Progress Notes (Signed)
Occupational Therapy Session Note  Patient Details  Name: Delta Deshmukh MRN: [id number] Date of Birth: [birth year]  Today's Date: 01/04/2013 Time: 0865-7846 and 1132-1200 Time Calculation (min): 60 min and 28 min  Short Term Goals: Week 2:  OT Short Term Goal 1 (Week 2): STG = LTGs due to remaining LOS  Skilled Therapeutic Interventions/Progress Updates:    1) Pt seen for ADL retraining with focus on increased independence with self-care tasks with gathering clothing and all necessary AE to assist with bathing and dressing prior to entering bathroom for shower.  Pt modified independent with toilet transfer, toileting, and walk-in shower transfer with increased time and use of RW and grab bars.  Pt continues to require assistance with donning TEDS.  Pt required assistance with donning shoes this session with use of reacher and long handled shoe horn, pt seated in recliner chair while dressing with decreased hip flexibility from this position.  2) Pt seen for 1:1 OT with focus on donning shoes with necessary AE.  Discussed various locations in house that pt can sit to don shoes as recliner from AM session proved to be quite difficult.  Use of BSC this session with pt demonstrating increased anterior pelvic tilt and support allowing him to reach further to don shoes.  Pt utilized long handed shoe horn to adjust shoes and was able to tie shoes this session.  Pt reports plan to dress either seated on BSC or has sturdy bench that might work for donning shoes.  Pt reports slight pain in Rt lower back with donning shoes this session.  Reapplied heat back and pt returned to recliner with lunch tray set up.  Therapy Documentation Precautions:  Precautions Precautions: Knee;Fall Precaution Comments: reviewed knee precaution (no pillow) Required Braces or Orthoses: Knee Immobilizer - Right Knee Immobilizer - Right: Other (comment) (Discontinued) Knee Immobilizer - Left: Other (comment)  (Discontinued) Restrictions Weight Bearing Restrictions: No RLE Weight Bearing: Weight bearing as tolerated LLE Weight Bearing: Weight bearing as tolerated Other Position/Activity Restrictions: WBAT bilaterally Pain: Pain Assessment Pain Assessment: 0-10 Pain Score: 4  Pain Type: Surgical pain Pain Location: Knee Pain Orientation: Left;Right Pain Descriptors / Indicators: Sore Pain Frequency: Constant Pain Onset: Progressive Patients Stated Pain Goal: 3 Pain Intervention(s): Medication (See eMAR) Multiple Pain Sites: No  See FIM for current functional status  Therapy/Group: Individual Therapy  Leonette Monarch 01/04/2013, 9:55 AM

## 2013-01-04 NOTE — Progress Notes (Signed)
Patient ID: Kent Ryan, male   DOB: Nov 17, 1950, 62 y.o.   MRN: [id number]  Subjective/Complaints: 62 y.o. right-handed male admitted 12/12/2012 with end-stage degenerative changes of both knees. No change with conservative care. Patient independent prior to admission. Underwent bilateral total knee replacements 12/13/2012 per Dr. Despina Hick. Placed on Coumadin for DVT prophylaxis and advised weightbearing as tolerated. Postoperative pain management with epidural removed 12/15/2012. Acute blood loss anemia with latest hemoglobin 7.4 and monitored. Bouts of nausea vomiting with abdominal films 12/16/2012 showing ileus and placed on Reglan when necessary and repeat abdominal films 12/21/2012 with minimal improvement of colonic ileus. Abd complaints have resolved  Got behind on pain meds   Review of Systems  Musculoskeletal: Positive for joint pain.  All other systems reviewed and are negative.    Objective: Vital Signs: Blood pressure 113/66, pulse 94, temperature 99 F (37.2 C), temperature source Axillary, resp. rate 20, height 6\' 1"  (1.854 m), weight 108.138 kg (238 lb 6.4 oz), SpO2 97.00%. No results found. Results for orders placed during the hospital encounter of 12/22/12 (from the past 72 hour(s))  PROTIME-INR     Status: Abnormal   Collection Time    01/02/13  5:36 AM      Result Value Range   Prothrombin Time 25.0 (*) 11.6 - 15.2 seconds   INR 2.36 (*) 0.00 - 1.49  COMPREHENSIVE METABOLIC PANEL     Status: Abnormal   Collection Time    01/02/13  8:25 AM      Result Value Range   Sodium 136  135 - 145 mEq/L   Potassium 4.0  3.5 - 5.1 mEq/L   Chloride 101  96 - 112 mEq/L   CO2 26  19 - 32 mEq/L   Glucose, Bld 136 (*) 70 - 99 mg/dL   BUN 14  6 - 23 mg/dL   Creatinine, Ser 4.40  0.50 - 1.35 mg/dL   Calcium 34.7 (*) 8.4 - 10.5 mg/dL   Total Protein 6.9  6.0 - 8.3 g/dL   Albumin 3.1 (*) 3.5 - 5.2 g/dL   AST 24  0 - 37 U/L   ALT 40  0 - 53 U/L   Alkaline Phosphatase 95  39 - 117  U/L   Total Bilirubin 0.6  0.3 - 1.2 mg/dL   GFR calc non Af Amer 89 (*) >90 mL/min   GFR calc Af Amer >90  >90 mL/min   Comment:            The eGFR has been calculated     using the CKD EPI equation.     This calculation has not been     validated in all clinical     situations.     eGFR's persistently     <90 mL/min signify     possible Chronic Kidney Disease.  CBC     Status: Abnormal   Collection Time    01/02/13  8:25 AM      Result Value Range   WBC 6.0  4.0 - 10.5 K/uL   RBC 3.87 (*) 4.22 - 5.81 MIL/uL   Hemoglobin 10.3 (*) 13.0 - 17.0 g/dL   HCT 42.5 (*) 95.6 - 38.7 %   MCV 83.2  78.0 - 100.0 fL   MCH 26.6  26.0 - 34.0 pg   MCHC 32.0  30.0 - 36.0 g/dL   RDW 56.4  33.2 - 95.1 %   Platelets 482 (*) 150 - 400 K/uL  PROTIME-INR     Status:  Abnormal   Collection Time    01/03/13  5:20 AM      Result Value Range   Prothrombin Time 22.6 (*) 11.6 - 15.2 seconds   INR 2.06 (*) 0.00 - 1.49  PROTIME-INR     Status: Abnormal   Collection Time    01/04/13  5:25 AM      Result Value Range   Prothrombin Time 23.1 (*) 11.6 - 15.2 seconds   INR 2.12 (*) 0.00 - 1.49    Head: Normocephalic.  Eyes: EOM are normal.  Neck: Normal range of motion. Neck supple. No thyromegaly present.  Cardiovascular: Normal rate and regular rhythm.  Pulmonary/Chest: Breath sounds normal. No respiratory distress.  Abdominal: Soft. Bowel sounds are hyperactive. Decreased distention. No pain Musculoskeletal:  Both knees CDI, no erythema.  No thigh or calf swelling  Neurological: He is alert and oriented to person, place, and time.  UE 5/5 LE 1+ to 2/5 HF, 1+ to 2 KE, 4/5 ankles. No sensory abnl  Skin:  Bilateral total knee replacements with dressing in place    Assessment/Plan: 1. 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 Physiatrist is providing close team supervision and 24 hour management of active medical problems listed below. Physiatrist and rehab team continue to assess barriers to discharge/monitor patient progress toward functional and medical goals. FIM: FIM - Bathing Bathing Steps Patient Completed: Chest;Right Arm;Left Arm;Abdomen;Front perineal area;Buttocks;Right upper leg;Left upper leg;Right lower leg (including foot);Left lower leg (including foot) Bathing: 5: Set-up assist to: Obtain items  FIM - Upper Body Dressing/Undressing Upper body dressing/undressing steps patient completed: Thread/unthread right sleeve of pullover shirt/dresss;Thread/unthread left sleeve of pullover shirt/dress;Put head through opening of pull over shirt/dress;Pull shirt over trunk Upper body dressing/undressing: 5: Set-up assist to: Obtain clothing/put away FIM - Lower Body Dressing/Undressing Lower body dressing/undressing steps patient completed: Thread/unthread right pants leg;Thread/unthread left pants leg;Pull pants up/down;Don/Doff right sock;Don/Doff left sock Lower body dressing/undressing: 5: Set-up assist to: Don/Doff TED stocking  FIM - Toileting Toileting steps completed by patient: Adjust clothing prior to toileting;Performs perineal hygiene;Adjust clothing after toileting Toileting Assistive Devices: Grab bar or rail for support Toileting: 6: Assistive device: No helper  FIM - Diplomatic Services operational officer Devices: Art gallery manager Transfers: 6-Assistive device: No helper;6-To toilet/ BSC;6-From toilet/BSC  FIM - Banker Devices: Therapist, occupational: 5: Bed > Chair or W/C: Supervision (verbal cues/safety issues);5: Chair or W/C > Bed: Supervision (verbal cues/safety issues)  FIM - Locomotion: Wheelchair Locomotion: Wheelchair: 0: Activity did not occur FIM - Locomotion: Ambulation Locomotion: Ambulation Assistive Devices: Dealer Ambulation/Gait Assistance: 5: Supervision Locomotion: Ambulation: 5: Travels 150 ft or more with supervision/safety issues  Comprehension Comprehension Mode: Auditory Comprehension: 7-Follows complex conversation/direction: With no assist  Expression Expression Mode: Verbal Expression: 7-Expresses complex ideas: With no assist  Social Interaction Social Interaction: 7-Interacts appropriately with others - No medications needed.  Problem Solving Problem Solving: 7-Solves complex problems: Recognizes & self-corrects  Memory Memory: 7-Complete Independence: No helper  Medical Problem List and Plan:  1. 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 DVT Prophylaxis/Anticoagulation: Coumadin for DVT prophylaxis. Monitor for any bleeding episodes Check vascular study  3. Pain Management: Robaxin and Dilaudid as needed. low dose oxycontin, analgesic balm for muscular pain in quads 4. Neuropsych: This patient is capable of making  decisions on his own behalf.  5. Acute blood loss anemia. Followup hemoglobin improved 6.Ileus. Resolved                  7. Obstructive sleep apnea. Continue CPAP  8. Hyperlipidemia, mild, supplement potassium, on HCT Z.  9. Leukocytosis resolved.  LOS (Days) 13 A FACE TO FACE EVALUATION WAS PERFORMED  Papa [place] E 01/04/2013, 9:30 AM

## 2013-01-04 NOTE — Progress Notes (Signed)
Physical Therapy Session Note  Patient Details  Name: Kent Ryan MRN: [id number] Date of Birth: [birth year]  Today's Date: 01/04/2013 Time: 3086-5784 and 6962-9528 Time Calculation (min): 60 min and 47 min  Short Term Goals: Week 2:  PT Short Term Goal 1 (Week 2): = LTG of mod I overall  Skilled Therapeutic Interventions/Progress Updates:   AM session:  Performed ambulation in controlled environment x 150' x 2 reps with RW and supervision with slight increased trunk flexion secondary to R lateral low back pain with muscle trigger point palpable.  Pt reports heat was beneficial yesterday.  Pt's son brought in measurements of stairs and doorways.  Performed gait training with RW up/down ramp with supervision x 2 reps.  Performed active knee flexion ROM and concentric and eccentric quad training on stairs with unilateral LE push ups on 6 inch step standing laterally on stairs with bilat UE support on one rail; 8 reps each side.  Performed stair negotiation training up/down 2 steps (6" tall each) x 3 reps with bilat rails with step to sequence forwards with extra time when descending.  Provided pt with handouts of LE exercises to perform on his own in his room and at home in between HHPT days.  Reviewed each exercise with patient; pt verbalized understanding of exercises.  Returning to room pt requested to use bathroom; pt performed all toileting tasks Mod I.  Pt set up in recliner with disposable hot pack on R lateral, low back and ice packs on bilat knees for pain and edema management.   Discussed with patient having son come in tomorrow for family education and car transfer training.     PM session: pt resting in recliner still with hot pack on R lower back and ice, Voltaren gel on bilat knees.  Performed ambulation x 150' in controlled environment for endurance with new RW for home with supervision.  Placed electrical stimulation trigger point mode on pt R lower, lateral back for 20 minutes  continuous, 50 pps at 22 intensity for pain management of low back during bilat LE exercises.  Peformed standing R and LE strengthening and balance exercises with bilat UE support on RW during 10 reps each: heel raises, hip flexion marches, hip ABD and HS curls with intermittent sitting rest breaks.  Performed seated w/c propulsion with LE to focus on knee flexion ROM and hamstring strengthening x 50' on level, tile surface.  Pt able to reach 70 deg flexion LLE, 75 deg flexion RLE.  E stim removed; pt reporting significant decrease in LBP with use of Estim.  Performed toileting mod I and returned to room and to recliner with supervision during ambulation with RW.  Pt to return to bed with nursing staff.    Therapy Documentation Precautions:  Precautions Precautions: Knee;Fall Precaution Comments: reviewed knee precaution (no pillow) Required Braces or Orthoses: Knee Immobilizer - Right Knee Immobilizer - Right: Other (comment) (Discontinued) Knee Immobilizer - Left: Other (comment) (Discontinued) Restrictions Weight Bearing Restrictions: No RLE Weight Bearing: Weight bearing as tolerated LLE Weight Bearing: Weight bearing as tolerated Other Position/Activity Restrictions: WBAT bilaterally Pain: Pain Assessment Pain Assessment: 0-10 Pain Score: 4  Pain Type: Surgical pain Pain Location: Knee Pain Orientation: Left;Right Pain Descriptors / Indicators: Sore Pain Frequency: Constant Pain Onset: Progressive Patients Stated Pain Goal: 3 Pain Intervention(s): Medication (See eMAR) Multiple Pain Sites: No  See FIM for current functional status  Therapy/Group: Individual Therapy  Edman Circle [facility] 01/04/2013, 10:31 AM

## 2013-01-04 NOTE — Progress Notes (Signed)
Pt places self on and off CPAP.  

## 2013-01-04 NOTE — Progress Notes (Signed)
ANTICOAGULATION CONSULT NOTE - Follow Up Consult  Pharmacy Consult for coumadin Indication: VTE prophylaxis  No Known Allergies  Labs:  Recent Labs  01/02/13 0536 01/02/13 0825 01/03/13 0520 01/04/13 0525  HGB  --  10.3*  --   --   HCT  --  32.2*  --   --   PLT  --  482*  --   --   LABPROT 25.0*  --  22.6* 23.1*  INR 2.36*  --  2.06* 2.12*  CREATININE  --  0.92  --   --     Estimated Creatinine Clearance: 107.4 ml/min (by C-G formula based on Cr of 0.92).   Medications:  Scheduled:  . atorvastatin  40 mg Oral q1800  . feeding supplement  237 mL Oral BID  . hydrochlorothiazide  12.5 mg Oral Daily  . latanoprost  1 drop Both Eyes QHS  . lisinopril  20 mg Oral Daily  . MUSCLE RUB   Topical TID  . OxyCODONE  10 mg Oral Q12H  . polyethylene glycol  17 g Oral Daily  . potassium chloride  10 mEq Oral BID  . warfarin  7.5 mg Oral Q T,Th,S,Su-1800   And  . warfarin  10 mg Oral Q M,W,F-1800  . Warfarin - Pharmacist Dosing Inpatient   Does not apply q1800    Assessment: 62 yo male s/p TKA is currently on coumadin for VTE prophylaxis.  INR remains therapeutic today at 2.12, cbc stable, No bleeding noted per chart.   Goal of Therapy:  INR 2-3 Monitor platelets by anticoagulation protocol: Yes   Plan:  1) Coumadin 10 mg on M/W/F, 7.5mg  on T/Th/Sat/Sun 2) Change to INR on MWF  Bayard Hugger, PharmD, BCPS  Clinical Pharmacist  Pager: [phone]   01/04/2013, 11:34 AM

## 2013-01-05 ENCOUNTER — Inpatient Hospital Stay (HOSPITAL_COMMUNITY): Payer: BC Managed Care – PPO | Admitting: Occupational Therapy

## 2013-01-05 ENCOUNTER — Inpatient Hospital Stay (HOSPITAL_COMMUNITY): Payer: BC Managed Care – PPO | Admitting: Physical Therapy

## 2013-01-05 LAB — PROTIME-INR: Prothrombin Time: 24.7 seconds — ABNORMAL HIGH (ref 11.6–15.2)

## 2013-01-05 NOTE — Progress Notes (Signed)
ANTICOAGULATION CONSULT NOTE - Follow Up Consult  Pharmacy Consult for coumadin Indication: VTE prophylaxis  No Known Allergies  Patient Measurements: Height: 6\' 1"  (185.4 cm) Weight: 238 lb 6.4 oz (108.138 kg) IBW/kg (Calculated) : 79.9 Heparin Dosing Weight:   Vital Signs: Temp: 98.5 F (36.9 C) (07/09 0545) Temp src: Axillary (07/09 0545) BP: 110/73 mmHg (07/09 0545) Pulse Rate: 90 (07/09 0545)  Labs:  Recent Labs  01/03/13 0520 01/04/13 0525 01/05/13 0610  LABPROT 22.6* 23.1* 24.7*  INR 2.06* 2.12* 2.32*    Estimated Creatinine Clearance: 107.4 ml/min (by C-G formula based on Cr of 0.92).   Medications:  Scheduled:  . atorvastatin  40 mg Oral q1800  . feeding supplement  237 mL Oral BID  . hydrochlorothiazide  12.5 mg Oral Daily  . latanoprost  1 drop Both Eyes QHS  . lisinopril  20 mg Oral Daily  . MUSCLE RUB   Topical TID  . OxyCODONE  10 mg Oral Q12H  . polyethylene glycol  17 g Oral Daily  . potassium chloride  10 mEq Oral BID  . warfarin  7.5 mg Oral Q T,Th,S,Su-1800   And  . warfarin  10 mg Oral Q M,W,F-1800  . Warfarin - Pharmacist Dosing Inpatient   Does not apply q1800   Infusions:    Assessment: 62 yo male s/p TKA is currently on therapeutic coumadin for VTE prophylaxis.  INR today is 2.32 Goal of Therapy:  INR 2-3 Monitor platelets by anticoagulation protocol: Yes   Plan:  1) If discharge today, continue coumadin 10mg  MWF and 7.5mg  TThSaSun and have INR recheck on Friday to reassess dosing.   Posey Petrik, [name] 01/05/2013,8:34 AM

## 2013-01-05 NOTE — Progress Notes (Signed)
Occupational Therapy Session Note  Patient Details  Name: Kent Ryan MRN: [id number] Date of Birth: 1950/07/12  Today's Date: 01/05/2013 Time: 0832-0932 and 1330-1400 Time Calculation (min): 60 min and 30 min  Short Term Goals: Week 2:  OT Short Term Goal 1 (Week 2): STG = LTGs due to remaining LOS  Skilled Therapeutic Interventions/Progress Updates:    1) Pt completed ADL retraining at overall modified independent level.  Pt completed bed mobility, gathered all necessary bathing and dressing items prior to shower, and completed bathing and dressing with use of RW for steady assist and AE for donning socks and shoes.  Pt reports slight discomfort in Rt lower back, however much improved from previous day, however wanting to use sock aid and shoe horn to "not overdo it".  Pt utilized BSC for LB dressing to increase anterior pelvic tilt to allow him to reach feet.  2) Pt seen for 1:1 OT with focus on walk-in shower transfer with stepping over 6.25" ledge as son reports floor to top of door track is 6".  Pt completed simulated shower transfer with alternating stepping over ledge with Rt foot first and then Lt foot first with stepping in backwards and out forwards.  Pt able to step over front and backwards with each foot, but demonstrated increased safety and foot clearance with Lt over first when backing in and stepping out with Rt first.  Pt able to return demonstrate use of reacher to obtain items from floor.  Discussed purchasing AE kit to assist with LB bathing and dressing prior to d/c.  Therapy Documentation Precautions:  Precautions Precautions: Knee;Fall Precaution Comments: reviewed knee precaution (no pillow) Required Braces or Orthoses: Knee Immobilizer - Right Knee Immobilizer - Right: Other (comment) (Discontinued) Knee Immobilizer - Left: Other (comment) (Discontinued) Restrictions Weight Bearing Restrictions: No RLE Weight Bearing: Weight bearing as tolerated LLE Weight  Bearing: Weight bearing as tolerated Other Position/Activity Restrictions: WBAT bilaterally Pain: Pain Assessment Pain Assessment: 0-10  Pain Score: 6  Pain Type: Surgical pain Pain Location: Knee Pain Orientation: Right;Left Pain Descriptors / Indicators: Aching;Sore Pain Intervention(s):  (premedicated) ADL: ADL Equipment Provided: Reacher;Sock aid;Long-handled shoe horn;Long-handled sponge Grooming: Modified independent Where Assessed-Grooming: Standing at sink Upper Body Bathing: Modified independent Where Assessed-Upper Body Bathing: Shower Lower Body Bathing: Modified independent Where Assessed-Lower Body Bathing: Shower Upper Body Dressing: Independent Where Assessed-Upper Body Dressing: Chair Lower Body Dressing: Modified independent Where Assessed-Lower Body Dressing: Chair Toileting: Modified independent Where Assessed-Toileting: Teacher, adult education: Engineer, agricultural Method: Proofreader: Raised Copy: Modified independent Film/video editor Method: Designer, industrial/product: Grab bars  See FIM for current functional status  Therapy/Group: Individual Therapy  Leonette Monarch 01/05/2013, 10:54 AM

## 2013-01-05 NOTE — Progress Notes (Signed)
Physical Therapy Session Note  Patient Details  Name: Kent Ryan MRN: [id number] Date of Birth: 04-09-1951  Today's Date: 01/05/2013 Time: 0932-1032 Time Calculation (min): 60 min  Short Term Goals: Week 2:  PT Short Term Goal 1 (Week 2): = LTG of mod I overall  Skilled Therapeutic Interventions/Progress Updates:   Pt son present for family education.  Session to focus on real, tall truck transfer with running board to back seat.  Performed x 3 reps stepping up to running board laterally and posteriorly and scooting backwards across back seat to bring LE into truck with UE support on grab bars in truck and scooting out of truck to ground with UE support on RW with supervision overall but verbal cues for safety and sequencing.  Son able to cue appropriately.  Continued education with stair and ramp negotiation for home entry/exit.  Pt performed ramp x 2 reps with RW and supervision overall.  Pt also performed up/down 2 steps (6" tall) x 8 reps (16 total stairs) forwards with 2 rails and laterally with R or L rail with supervision with step to sequencing.  Pt reports he may go to his sister's house for a few days > week until home modifications are completed.  Alerted Child psychotherapist to change in D/C disposition for HHPT.  Pt received RW for home.  Pt performed ambulation x 150' in controlled and home environments with RW mod I.  Pt is also mod I for bed <> chair transfers and bed mobility.     Therapy Documentation Precautions:  Precautions Precautions: Knee;Fall Precaution Comments: reviewed knee precaution (no pillow) Required Braces or Orthoses: Knee Immobilizer - Right Knee Immobilizer - Right: Other (comment) (Discontinued) Knee Immobilizer - Left: Other (comment) (Discontinued) Restrictions Weight Bearing Restrictions: No RLE Weight Bearing: Weight bearing as tolerated LLE Weight Bearing: Weight bearing as tolerated Other Position/Activity Restrictions: WBAT bilaterally Pain: Pain  Assessment Pain Assessment: 0-10 (Simultaneous filing. User may not have seen previous data.) Pain Score: 6  Pain Type: Surgical pain Pain Location: Knee Pain Orientation: Right;Left Pain Descriptors / Indicators: Aching;Sore Pain Intervention(s):  (premedicated) Locomotion : Ambulation Ambulation/Gait Assistance: 6: Modified independent (Device/Increase time)   See FIM for current functional status  Therapy/Group: Individual Therapy  Edman Circle [facility] 01/05/2013, 10:52 AM

## 2013-01-05 NOTE — Progress Notes (Signed)
Patient ID: Kent Ryan, male   DOB: 02-04-1951, 62 y.o.   MRN: [id number]  Subjective/Complaints: 62 y.o. right-handed male admitted 12/12/2012 with end-stage degenerative changes of both knees. No change with conservative care. Patient independent prior to admission. Underwent bilateral total knee replacements 12/13/2012 per Dr. Despina Hick. Placed on Coumadin for DVT prophylaxis and advised weightbearing as tolerated. Postoperative pain management with epidural removed 12/15/2012. Acute blood loss anemia with latest hemoglobin 7.4 and monitored. Bouts of nausea vomiting with abdominal films 12/16/2012 showing ileus and placed on Reglan when necessary and repeat abdominal films 12/21/2012 with minimal improvement of colonic ileus. Abd complaints have resolved  Pain controlled, CPM to 90 deg   Review of Systems  Musculoskeletal: Positive for joint pain.  All other systems reviewed and are negative.    Objective: Vital Signs: Blood pressure 110/73, pulse 90, temperature 98.5 F (36.9 C), temperature source Axillary, resp. rate 19, height 6\' 1"  (1.854 m), weight 108.138 kg (238 lb 6.4 oz), SpO2 99.00%. No results found. Results for orders placed during the hospital encounter of 12/22/12 (from the past 72 hour(s))  PROTIME-INR     Status: Abnormal   Collection Time    01/03/13  5:20 AM      Result Value Range   Prothrombin Time 22.6 (*) 11.6 - 15.2 seconds   INR 2.06 (*) 0.00 - 1.49  PROTIME-INR     Status: Abnormal   Collection Time    01/04/13  5:25 AM      Result Value Range   Prothrombin Time 23.1 (*) 11.6 - 15.2 seconds   INR 2.12 (*) 0.00 - 1.49  PROTIME-INR     Status: Abnormal   Collection Time    01/05/13  6:10 AM      Result Value Range   Prothrombin Time 24.7 (*) 11.6 - 15.2 seconds   INR 2.32 (*) 0.00 - 1.49    Head: Normocephalic.  Eyes: EOM are normal.  Neck: Normal range of motion. Neck supple. No thyromegaly present.  Cardiovascular: Normal rate and regular rhythm.   Pulmonary/Chest: Breath sounds normal. No respiratory distress.  Abdominal: Soft. Bowel sounds are hyperactive. Decreased distention. No pain Musculoskeletal:  Both knees CDI, no erythema.  No thigh or calf swelling  Neurological: He is alert and oriented to person, place, and time.  UE 5/5 LE 1+ to 2/5 HF, 1+ to 2 KE, 4/5 ankles. No sensory abnl  Skin:  Bilateral total knee replacements with dressing in place    Assessment/Plan: 1. 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 Physiatrist is providing close team supervision and 24 hour management of active medical problems listed below. Physiatrist and rehab team continue to assess barriers to discharge/monitor patient progress toward functional and medical goals. Plan D/C in am FIM: FIM - Bathing Bathing Steps Patient Completed: Chest;Right Arm;Left Arm;Abdomen;Front perineal area;Buttocks;Right upper leg;Left upper leg;Right lower leg (including foot);Left lower leg (including foot) Bathing: 6: Assistive device (Comment)  FIM - Upper Body Dressing/Undressing Upper body dressing/undressing steps patient completed: Thread/unthread right sleeve of pullover shirt/dresss;Thread/unthread left sleeve of pullover shirt/dress;Put head through opening of pull over shirt/dress;Pull shirt over trunk Upper body dressing/undressing: 7: Complete Independence: No helper FIM - Lower Body Dressing/Undressing Lower body dressing/undressing steps patient completed: Thread/unthread right pants leg;Thread/unthread left pants leg;Pull pants up/down;Don/Doff right sock;Don/Doff left sock;Don/Doff right shoe;Don/Doff left shoe;Fasten/unfasten right shoe;Fasten/unfasten left shoe Lower body  dressing/undressing: 6: Assistive device (Comment) (sock aid and long handled shoe horn)  FIM -  Toileting Toileting steps completed by patient: Adjust clothing prior to toileting;Performs perineal hygiene;Adjust clothing after toileting Toileting Assistive Devices: Grab bar or rail for support Toileting: 6: Assistive device: No helper  FIM - Diplomatic Services operational officer Devices: Art gallery manager Transfers: 6-Assistive device: No helper;6-To toilet/ BSC;6-From toilet/BSC  FIM - Banker Devices: Therapist, occupational: 6: Assistive device: no helper;6: Supine > Sit: No assist;6: Sit > Supine: No assist;6: Bed > Chair or W/C: No assist;6: Chair or W/C > Bed: No assist  FIM - Locomotion: Wheelchair Locomotion: Wheelchair: 0: Activity did not occur FIM - Locomotion: Ambulation Locomotion: Ambulation Assistive Devices: Designer, industrial/product Ambulation/Gait Assistance: 6: Modified independent (Device/Increase time) Locomotion: Ambulation: 6: Travels 150 ft or more with assistive device/no helper  Comprehension Comprehension Mode: Auditory Comprehension: 7-Follows complex conversation/direction: With no assist  Expression Expression Mode: Verbal Expression: 7-Expresses complex ideas: With no assist  Social Interaction Social Interaction: 7-Interacts appropriately with others - No medications needed.  Problem Solving Problem Solving: 7-Solves complex problems: Recognizes & self-corrects  Memory Memory: 7-Complete Independence: No helper  Medical Problem List and Plan:  1. 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 DVT Prophylaxis/Anticoagulation: Coumadin for DVT prophylaxis. Monitor for any bleeding episodes Check vascular study  3. Pain Management: Robaxin and Dilaudid as needed. low dose oxycontin, analgesic balm for muscular pain in quads 4. Neuropsych: This patient is capable of making decisions on his own behalf.  5. Acute blood loss anemia. Followup hemoglobin  improved 6.Ileus. Resolved                  7. Obstructive sleep apnea. Continue CPAP  8. Hyperlipidemia, mild, supplement potassium, on HCT Z.  9. Leukocytosis resolved.  LOS (Days) 14 A FACE TO FACE EVALUATION WAS PERFORMED  KIRSTEINS,ANDREW E 01/05/2013, 2:12 PM

## 2013-01-05 NOTE — Progress Notes (Signed)
Social Work Patient ID: Kent Ryan, male   DOB: Sep 01, 1950, 62 y.o.   MRN: [id number] Met with pt to inform team conference progression toward goals and discharge tomorrow.  Aware no CPM machine needed due to pt at 90 degree of flexion. Have gotten sister's address of where he is going at discharge.  Follow up arranged and he feels prepared for discharge tomorrow.  Family education done with son today.

## 2013-01-05 NOTE — Discharge Summary (Signed)
NAME:  Kent Ryan, Kent Ryan NO.:  [id number]  MEDICAL RECORD NO.:  [id number]  LOCATION:  4036                         FACILITY:  [facility]  PHYSICIAN:  Kent Ryan, P.A.  DATE OF BIRTH:  Jun 26, 1951  DATE OF ADMISSION:  12/22/2012 DATE OF DISCHARGE:                              DISCHARGE SUMMARY   DISCHARGE DIAGNOSES: 1. Bilateral total knee replacement secondary to degenerative joint     disease. 2. Coumadin for deep vein thrombosis prophylaxis. 3. Pain management. 4. Acute blood loss anemia. 5. Ileus, resolved. 6. Obstructive sleep apnea.  HISTORY OF PRESENT ILLNESS:  This is a 62 year old right-handed male, admitted on December 12, 2012 with end-stage degenerative changes of both knees and no relief with conservative care.  The patient was independent prior to admission.  Underwent bilateral total knee replacement on December 13, 2012, per Dr. Lequita Ryan.  Placed on Coumadin for DVT prophylaxis and weightbearing as tolerated.  Postoperative pain management.  Acute blood loss anemia 7.4, and monitored.  Bouts of nausea, vomiting, with abdominal films on 12/16/2012 showing ileus and placed on Reglan as needed, and repeat abdominal films with steady improvement.  Diet advanced.  Physical occupational therapy on going.  The patient was admitted for comprehensive rehab program.  PAST MEDICAL HISTORY:  See discharge diagnoses.  SOCIAL HISTORY:  Lives with spouse.  FUNCTIONAL HISTORY:  Prior to admission, independent using a cane at times.  Functional status upon admission to rehab services, +2 total assist, ambulate 3 feet with a rolling walker.  PHYSICAL EXAMINATION:  VITAL SIGNS:  Blood pressure 156/79, pulse 108, temperature 98.1, respirations 20. GENERAL:  This was an alert male, oriented x3. HEENT:  Pupils reactive to light. LUNGS:  Clear to auscultation. CARDIAC:  Regular rate and rhythm. ABDOMEN:  Soft, nontender, nondistended, with good bowel  sounds. EXTREMITIES:  Bilateral knee incisions appropriately tender with no drainage.  Steri-Strips in place.  REHABILITATION HOSPITAL COURSE:  The patient was admitted to inpatient rehab services with therapies initiated on a 3-hour daily basis consisting of physical therapy, occupational therapy, and rehabilitation nursing.  The following issues were addressed during the patient's rehabilitation stay.  Pertaining Kent Ryan bilateral total knee replacement; remained stable, neurovascular sensation intact, he was weightbearing as tolerated.  The patient would follow up with Orthopedics Services.  The patient remained on Coumadin for DVT prophylaxis.  Venous Doppler studies were negative.  He would continue Coumadin until January 12, 2013 and stop, and at that time begin aspirin therapy 81 mg daily x4 weeks.  Pain management ongoing with the use of OxyContin sustained release 10 mg every 12 hours as well as Dilaudid as needed, and Robaxin for muscle spasms.  Acute blood loss anemia remained stable.  The patient remained asymptomatic, latest hemoglobin of 10.3. He did have a history of hypertension and blood pressure is controlled with lisinopril 20 mg daily.  Postoperative ileus resolved.  Bowel program established.  No further nausea or vomiting.  The patient received weekly collaborative interdisciplinary team conferences to discuss estimated length of stay, family teaching, and any barriers to discharge.  He was continent of bowel and bladder.  Supervision overall for activities of daily living, supervision minimal assist.  Basic transfers and ambulation as well as navigating stairs.  Plan was to be discharged to home with his wife, who could provide very little physical assistance, home health therapies had been arranged.  DISCHARGE MEDICATIONS:  At the time of dictation included Lipitor 40 mg p.o. daily, Flonase 2 sprays each nasal passage daily as needed, hydrochlorothiazide  12.5 mg p.o. daily, Xalatan ophthalmic solution 0.05% 1 drop both eyes at bedtime, lisinopril 20 mg p.o. daily, Robaxin 500 mg p.o. every 6 hours as needed for muscle spasms, OxyContin sustained release 10 mg every 12 hours with slow taper, potassium chloride 10 mEq p.o. b.i.d., Coumadin 7.5 mg daily adjusted accordingly for an INR of 2.0 to 3.0 with Coumadin to be completed on January 12, 2013.  SPECIAL INSTRUCTIONS:  A home health nurse had been arranged to complete Coumadin protocol.  After Coumadin completed, the patient was to begin aspirin therapy 81 mg p.o. daily x4 weeks.  The patient would follow up with Dr. Claudette Ryan at the outpatient rehab center as needed.  Kent Ryan,  Orthopedic Services two weeks, call for appointment. Home health therapies had been arranged.     Kent Ryan, P.A.     DA/MEDQ  D:  01/05/2013  T:  01/05/2013  Job:  [id number]  cc:   Ollen Ryan, M.D. Kent Ryan, M.D.

## 2013-01-05 NOTE — Progress Notes (Signed)
Social Work Patient ID: Kent Ryan, male   DOB: 04/16/1951, 62 y.o.   MRN: [id number] Have to switch home health agencies due to pt going to [place] and [facility] does not cover this area.  Have made referral to  [facility] for follow up PT, RN.  Pt agreeable to this plan.

## 2013-01-05 NOTE — Progress Notes (Signed)
Occupational Therapy Discharge Summary  Patient Details  Name: Kent Ryan MRN: [id number] Date of Birth: [birth year]  Today's Date: 01/05/2013  Patient has met 9 of 9 long term goals due to improved activity tolerance, improved balance, postural control, ability to compensate for deficits and functional use of  LEFT upper and LEFT lower extremity.  Patient to discharge at overall Modified Independent level.  Patient's care partner is independent to provide the necessary setup assistance at discharge.    Reasons goals not met: N/A  Recommendation:  Patient will not require follow up OT at this time.  However will be followed by HHPT.  Equipment: BSC  Reasons for discharge: treatment goals met and discharge from hospital  Patient/family agrees with progress made and goals achieved: Yes  OT Discharge Precautions/Restrictions  Restrictions Weight Bearing Restrictions: No RLE Weight Bearing: Weight bearing as tolerated LLE Weight Bearing: Weight bearing as tolerated Pain Pain Assessment Pain Assessment: 0-10  Pain Score: 6  Pain Type: Surgical pain Pain Location: Knee Pain Orientation: Right;Left Pain Descriptors / Indicators: Aching;Sore Pain Intervention(s):  (premedicated) ADL ADL Equipment Provided: Reacher;Sock aid;Long-handled shoe horn;Long-handled sponge Grooming: Modified independent Where Assessed-Grooming: Standing at sink Upper Body Bathing: Modified independent Where Assessed-Upper Body Bathing: Shower Lower Body Bathing: Modified independent Where Assessed-Lower Body Bathing: Shower Upper Body Dressing: Independent Where Assessed-Upper Body Dressing: Chair Lower Body Dressing: Modified independent Where Assessed-Lower Body Dressing: Chair Toileting: Modified independent Where Assessed-Toileting: Neurosurgeon Method: Proofreader: Raised Copy: Modified  independent Film/video editor Method: Designer, industrial/product: Grab bars Vision/Perception  Vision - History Baseline Vision: Wears glasses only for reading Patient Visual Report: No change from baseline Vision - Assessment Eye Alignment: Within Chemical engineer Perception: Within Functional Limits Praxis Praxis: Intact  Cognition Overall Cognitive Status: Within Functional Limits for tasks assessed Arousal/Alertness: Awake/alert Orientation Level: Oriented X4 Memory: Appears intact Awareness: Appears intact Safety/Judgment: Appears intact Sensation Sensation Light Touch: Appears Intact Stereognosis: Not tested Hot/Cold: Not tested Proprioception: Appears Intact Coordination Gross Motor Movements are Fluid and Coordinated: No Fine Motor Movements are Fluid and Coordinated: Yes Coordination and Movement Description: Limited by weakness and decreased ROM bilat knees Motor  Motor Motor: Abnormal postural alignment and control;Other (comment) Motor - Discharge Observations: Bilat LE weakness, impaired ROM and trunk flexion during gait/standing Mobility  Bed Mobility Bed Mobility: Supine to Sit;Sit to Supine Supine to Sit: 6: Modified independent (Device/Increase time);HOB flat Sit to Supine: HOB flat;6: Modified independent (Device/Increase time)  Trunk/Postural Assessment  Cervical Assessment Cervical Assessment: Within Functional Limits Thoracic Assessment Thoracic Assessment: Within Functional Limits Lumbar Assessment Lumbar Assessment: Within Functional Limits Postural Control Postural Control: Deficits on evaluation (limited ability to use balance strategies from weakness)  Extremity/Trunk Assessment RUE Assessment RUE Assessment: Within Functional Limits LUE Assessment LUE Assessment: Within Functional Limits  See FIM for current functional status  Leonette Monarch 01/05/2013, 2:45 PM

## 2013-01-05 NOTE — Progress Notes (Signed)
Physical Therapy Discharge Summary  Patient Details  Name: Kent Ryan MRN: [id number] Date of Birth: 1951/03/22  Today's Date: 01/05/2013 Time: 4098-1191 Time Calculation (min): 45 min  Patient has met 10 of 10 long term goals due to improved activity tolerance, improved balance, increased strength, increased range of motion, decreased pain, ability to compensate for deficits and functional use of  right lower extremity and left lower extremity.  Patient to discharge at an ambulatory level Modified Independent with RW.   Patient's care partner (son)  is independent to provide the necessary supervision assistance at discharge for car transfers and stair or ramp negotiation.  Reasons goals not met: All goals met  Recommendation:  Patient will benefit from ongoing skilled PT services in home health setting to continue to advance safe functional mobility, address ongoing impairments in bilat LE AROM, PROM, strength, endurance, balance, gait, and minimize fall risk.  Equipment: RW  Reasons for discharge: treatment goals met and discharge from hospital  Patient/family agrees with progress made and goals achieved: Yes  PT Discharge Vital Signs Therapy Vitals Temp: 98 F (36.7 C) Temp src: Oral Pulse Rate: 105 Resp: 18 BP: 104/70 mmHg Patient Position, if appropriate: Sitting Oxygen Therapy SpO2: 98 % Pain Pain Assessment Pain Assessment: No/denies pain Pain Score: 4  Pain Type: Surgical pain Pain Location: Knee Pain Orientation: Right;Left Pain Descriptors / Indicators: Aching Pain Frequency: Constant Pain Onset: Progressive Patients Stated Pain Goal: 3 Pain Intervention(s): Medication (See eMAR) Sensation Sensation Light Touch: Appears Intact Stereognosis: Not tested Hot/Cold: Not tested Proprioception: Appears Intact Coordination Gross Motor Movements are Fluid and Coordinated: No Coordination and Movement Description: Limited by weakness and decreased ROM bilat  knees Motor  Motor Motor: Abnormal postural alignment and control;Other (comment) Motor - Discharge Observations: Bilat LE weakness, impaired ROM and trunk flexion during gait/standing  Mobility Bed Mobility Bed Mobility: Supine to Sit;Sit to Supine Supine to Sit: 6: Modified independent (Device/Increase time);HOB flat Sit to Supine: HOB flat;6: Modified independent (Device/Increase time) Transfers Stand Pivot Transfers: 6: Modified independent (Device/Increase time);From elevated surface;With armrests;Other (comment) Locomotion  Ambulation Ambulation/Gait Assistance: 6: Modified independent (Device/Increase time) x 150' with RW in controlled and home environments  Assistive device: Rolling walker Gait Gait Pattern: Step-through pattern;Decreased hip/knee flexion - right;Decreased hip/knee flexion - left;Decreased trunk rotation;Trunk flexed;Wide base of support Stairs / Additional Locomotion Stairs: Yes Stairs Assistance: 5: Supervision Stair Management Technique: One rail Right;One rail Left;Two rails;Step to pattern;Sideways;Forwards Height of Stairs: 6 Ramp: 5: Supervision Wheelchair Mobility Wheelchair Mobility: No  Trunk/Postural Assessment  Cervical Assessment Cervical Assessment: Within Functional Limits Thoracic Assessment Thoracic Assessment: Within Functional Limits Lumbar Assessment Lumbar Assessment: Within Functional Limits Postural Control Postural Control: Deficits on evaluation (limited ability to use balance strategies from weakness)  Balance Static Sitting Balance Static Sitting - Level of Assistance: 7: Independent Static Standing Balance Static Standing - Balance Support: Bilateral upper extremity supported;No upper extremity supported Static Standing - Level of Assistance: 6: Modified independent (Device/Increase time) Dynamic Standing Balance Dynamic Standing - Balance Support: Right upper extremity supported;Left upper extremity supported;No upper  extremity supported Dynamic Standing - Level of Assistance: 6: Modified independent (Device/Increase time) Dynamic Standing - Balance Activities: Reaching for objects;Reaching across midline;Other (comment) (with external perturbations) Extremity Assessment  RLE Assessment RLE Assessment: Exceptions to [facility] RLE AROM (degrees) Right Knee Extension: 20 Right Knee Flexion: 75 RLE Strength RLE Overall Strength: Deficits;Due to pain RLE Overall Strength Comments: 3/5 hip flexion, 2+/5 knee flexion and extension within available range, 5/5 ankle  DF  LLE Assessment LLE Assessment: Exceptions to [facility] LLE AROM (degrees) Left Knee Extension: 10 Left Knee Flexion: 75 LLE Strength LLE Overall Strength: Due to pain;Deficits LLE Overall Strength Comments: 3/5 hip flexion, 2+/5 knee flexion and extension within available range, 5/5 ankle DF   In long sitting on mat pt performed bilat LE quad and glute sets for knee extension ROM with use of leg lifter to add gastroc stretch, hip IR with isometric hold, heel slides with leg lifter to assist with increasing knee flexion ROM and performed sit > stand from elevated mat with feet directly under COG during sit > stand.  Returned to room mod I with RW.    See FIM for current functional status  Edman Circle [facility] 01/05/2013, 5:05 PM

## 2013-01-05 NOTE — Discharge Summary (Signed)
  Discharge summary job [id number]

## 2013-01-05 NOTE — Progress Notes (Signed)
Social Work Patient ID: Kent Ryan, male   DOB: 05/11/1951, 62 y.o.   MRN: [id number] Contacted Clydie Braun [employer] Case Manager to give verbal update.  Awaiting return call with authorization. Preparing pt for discharge tomorrow, has reached his goals and is ready to go home.

## 2013-01-05 NOTE — Patient Care Conference (Signed)
Inpatient RehabilitationTeam Conference and Plan of Care Update Date: 01/05/2013   Time: 10:30 AM    Patient Name: Kent Ryan      Medical Record Number: [id number]  Date of Birth: [birth year] Sex: Male         Room/Bed: 4036/4036-01 Payor Info: Payor: BLUE CROSS BLUE SHIELD / Plan: BCBS [place] PPO / Product Type: *No Product type* /    Admitting Diagnosis: B TKR  Admit Date/Time:  12/22/2012 12:35 PM Admission Comments: No comment available   Primary Diagnosis:  Status post bilateral knee replacements Principal Problem: Status post bilateral knee replacements  Patient Active Problem List   Diagnosis Date Noted  . Status post bilateral knee replacements 12/22/2012  . Ileus, postoperative 12/16/2012  . Postoperative anemia due to acute blood loss 12/16/2012  . Hyponatremia 12/16/2012  . OA (osteoarthritis) of knee 12/13/2012    Expected Discharge Date: Expected Discharge Date: 01/06/13  Team Members Present: Physician leading conference: Dr. Claudette Laws Social Worker Present: Dossie Der, LCSW Nurse Present: Rosalio Macadamia, RN PT Present: Edson Snowball, PT;Caroline Chauncy Lean, PT OT Present: Bretta Bang, Verlene Mayer, OT;Other (comment) Dorathy Daft Perkinson-OT) SLP Present: Fae Pippin, SLP     Current Status/Progress Goal Weekly Team Focus  Medical   ROM slowly improving, Pain control ok  At 90 deg CPM, PT measures 75 degrees  D/C planning   Bowel/Bladder   pt continent of bowel and bladder  Remain continent      Swallow/Nutrition/ Hydration     na        ADL's   mod I bathing and toilet transfers, supervision walk-in shower stall, min assist LB dressing (shoes)  Mod I overall  AE use for LB self-care tasks, higher level ADLs, dynamic balance   Mobility   Goals met  Mod I overall except supervision car transfers and stairs  Family education and finalizing D/C   Communication     na        Safety/Cognition/ Behavioral Observations    na         Pain     pain controlled        Skin   Incisions to bilateral knees   Incisions will heal with out signs and symptoms of infection  Continue to assess      *See Care Plan and progress notes for long and short-term goals.  Barriers to Discharge: none    Possible Resolutions to Barriers:  HHPT,OT,RN    Discharge Planning/Teaching Needs:  Home with intermittent assist via family.  Preparing for discharge tomorrow      Team Discussion:  Pt reaching goals ready for discharge tomorrow.  Family education completed today with son. Knee flexion 90 degrees no need for home CPM.  Going to sister's now-short time  Revisions to Treatment Plan:  None   Continued Need for Acute Rehabilitation Level of Care: The patient requires daily medical management by a physician with specialized training in physical medicine and rehabilitation for the following conditions: Daily direction of a multidisciplinary physical rehabilitation program to ensure safe treatment while eliciting the highest outcome that is of practical value to the patient.: Yes Daily medical management of patient stability for increased activity during participation in an intensive rehabilitation regime.: Yes Daily analysis of laboratory values and/or radiology reports with any subsequent need for medication adjustment of medical intervention for : Post surgical problems  Chabeli Barsamian, Lemar Livings 01/05/2013, 4:07 PM

## 2013-01-06 MED ORDER — LISINOPRIL-HYDROCHLOROTHIAZIDE 20-12.5 MG PO TABS: 1.0000 | ORAL_TABLET | Freq: Every morning | ORAL | Status: DC

## 2013-01-06 MED ORDER — ACETAMINOPHEN 325 MG PO TABS: 325.0000 mg | ORAL_TABLET | ORAL | Status: DC | PRN

## 2013-01-06 MED ORDER — ATORVASTATIN CALCIUM 40 MG PO TABS: 40.0000 mg | ORAL_TABLET | Freq: Every day | ORAL | Status: DC

## 2013-01-06 MED ORDER — LATANOPROST 0.005 % OP SOLN: 1.0000 [drp] | Freq: Every day | OPHTHALMIC | Status: DC

## 2013-01-06 MED ORDER — WARFARIN SODIUM 5 MG PO TABS: ORAL_TABLET | ORAL | Status: DC

## 2013-01-06 MED ORDER — FLUTICASONE PROPIONATE 50 MCG/ACT NA SUSP: 2.0000 | Freq: Every day | NASAL | Status: DC | PRN

## 2013-01-06 MED ORDER — HYDROMORPHONE HCL 2 MG PO TABS
2.0000 mg | ORAL_TABLET | ORAL | Status: DC | PRN
Start: 2013-01-06 — End: 2013-05-24

## 2013-01-06 MED ORDER — POLYETHYLENE GLYCOL 3350 17 G PO PACK: 17.0000 g | PACK | Freq: Every day | ORAL | Status: DC

## 2013-01-06 MED ORDER — METHOCARBAMOL 500 MG PO TABS: 500.0000 mg | ORAL_TABLET | Freq: Four times a day (QID) | ORAL | Status: DC | PRN

## 2013-01-06 MED ORDER — OXYCODONE HCL ER 10 MG PO T12A: 10.0000 mg | EXTENDED_RELEASE_TABLET | Freq: Two times a day (BID) | ORAL | Status: DC

## 2013-01-06 NOTE — Progress Notes (Signed)
Patient ID: Kent Ryan, male   DOB: [birth year], 62 y.o.   MRN: [id number]  Subjective/Complaints: 62 y.o. right-handed male admitted 12/12/2012 with end-stage degenerative changes of both knees. No change with conservative care. Patient independent prior to admission. Underwent bilateral total knee replacements 12/13/2012 per Dr. Despina Hick. Placed on Coumadin for DVT prophylaxis and advised weightbearing as tolerated. Postoperative pain management with epidural removed 12/15/2012. Acute blood loss anemia with latest hemoglobin 7.4 and monitored. Bouts of nausea vomiting with abdominal films 12/16/2012 showing ileus and placed on Reglan when necessary and repeat abdominal films 12/21/2012 with minimal improvement of colonic ileus. Abd complaints have resolved  Pain controlled, going to sisters home because of water damage at his home   Review of Systems  Musculoskeletal: Positive for joint pain.  All other systems reviewed and are negative.    Objective: Vital Signs: Blood pressure 126/70, pulse 105, temperature 98 F (36.7 C), temperature source Oral, resp. rate 18, height 6\' 1"  (1.854 m), weight 123.968 kg (273 lb 4.8 oz), SpO2 98.00%. No results found. Results for orders placed during the hospital encounter of 12/22/12 (from the past 72 hour(s))  PROTIME-INR     Status: Abnormal   Collection Time    01/04/13  5:25 AM      Result Value Range   Prothrombin Time 23.1 (*) 11.6 - 15.2 seconds   INR 2.12 (*) 0.00 - 1.49  PROTIME-INR     Status: Abnormal   Collection Time    01/05/13  6:10 AM      Result Value Range   Prothrombin Time 24.7 (*) 11.6 - 15.2 seconds   INR 2.32 (*) 0.00 - 1.49    Head: Normocephalic.  Eyes: EOM are normal.  Neck: Normal range of motion. Neck supple. No thyromegaly present.  Cardiovascular: Normal rate and regular rhythm.  Pulmonary/Chest: Breath sounds normal. No respiratory distress.  Abdominal: Soft. Bowel sounds are hyperactive. Decreased distention. No  pain Musculoskeletal:  Both knees CDI, no erythema.  No thigh or calf swelling  Neurological: He is alert and oriented to person, place, and time.  UE 5/5 LE 1+ to 2/5 HF, 1+ to 2 KE, 4/5 ankles. No sensory abnl  Skin:  Bilateral total knee replacements with dressing in place    Assessment/Plan: 1. 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 Stable for D/C today F/u orthp in 1-2 weeks F/u  PCP 3 weeks See D/C summary See D/C instructions  FIM - Bathing Bathing Steps Patient Completed: Chest;Right Arm;Left Arm;Abdomen;Front perineal area;Buttocks;Right upper leg;Left upper leg;Right lower leg (including foot);Left lower leg (including foot) Bathing: 6: Assistive device (Comment)  FIM - Upper Body Dressing/Undressing Upper body dressing/undressing steps patient completed: Thread/unthread right sleeve of pullover shirt/dresss;Thread/unthread left sleeve of pullover shirt/dress;Put head through opening of pull over shirt/dress;Pull shirt over trunk Upper body dressing/undressing: 7: Complete Independence: No helper FIM - Lower Body Dressing/Undressing Lower body dressing/undressing steps patient completed: Thread/unthread right pants leg;Thread/unthread left pants leg;Pull pants up/down;Don/Doff right sock;Don/Doff left sock;Don/Doff right shoe;Don/Doff left shoe;Fasten/unfasten right shoe;Fasten/unfasten left shoe Lower body dressing/undressing: 6: Assistive device (Comment) (sock aid and long handled shoe horn)  FIM - Toileting Toileting steps completed by patient: Adjust clothing prior to toileting;Performs perineal hygiene;Adjust clothing after toileting Toileting Assistive Devices: Grab bar or rail for support Toileting: 6: Assistive device: No helper  FIM - Diplomatic Services operational officer Devices:  Walker Toilet Transfers: 6-Assistive device: No helper;6-To toilet/ BSC;6-From toilet/BSC  FIM - Banker Devices: Therapist, occupational: 6: Assistive device: no helper;6: Supine > Sit: No assist;6: Sit > Supine: No assist;6: Bed > Chair or W/C: No assist;6: Chair or W/C > Bed: No assist  FIM - Locomotion: Wheelchair Locomotion: Wheelchair: 0: Activity did not occur FIM - Locomotion: Ambulation Locomotion: Ambulation Assistive Devices: Designer, industrial/product Ambulation/Gait Assistance: 6: Modified independent (Device/Increase time) Locomotion: Ambulation: 6: Travels 150 ft or more with assistive device/no helper  Comprehension Comprehension Mode: Auditory Comprehension: 7-Follows complex conversation/direction: With no assist  Expression Expression Mode: Verbal Expression: 7-Expresses complex ideas: With no assist  Social Interaction Social Interaction: 7-Interacts appropriately with others - No medications needed.  Problem Solving Problem Solving: 7-Solves complex problems: Recognizes & self-corrects  Memory Memory: 7-Complete Independence: No helper  Medical Problem List and Plan:  1. 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 DVT Prophylaxis/Anticoagulation: Coumadin for DVT prophylaxis. Monitor for any bleeding episodes Check vascular study  3. Pain Management: Robaxin and Dilaudid as needed. low dose oxycontin, analgesic balm for muscular pain in quads 4. Neuropsych: This patient is capable of making decisions on his own behalf.  5. Acute blood loss anemia. Followup hemoglobin improved 6.Ileus. Resolved                  7. Obstructive sleep apnea. Continue CPAP  8. Hyperlipidemia, mild, supplement potassium, on HCT Z.  9. Leukocytosis resolved.  LOS (Days) 15 A FACE TO FACE EVALUATION WAS PERFORMED  KIRSTEINS,ANDREW E 01/06/2013, 10:03 AM

## 2013-01-06 NOTE — Progress Notes (Signed)
Pt discharged home with son, Dan Finland PA provided discharge instructions, pt verbalized an understanding and denied any concerns. Pt discharged to private vehicle

## 2013-01-06 NOTE — Progress Notes (Signed)
Social Work Discharge Note Discharge Note  The overall goal for the admission was met for:   Discharge location: Yes-HOME WITH SISTER  Length of Stay: Yes-15 DAYS  Discharge activity level: Yes-MOD/I LEVEL  Home/community participation: Yes  Services provided included: MD, RD, PT, OT, RN, Pharmacy and SW  Financial Services: Private Insurance: BCBS  Follow-up services arranged: Home Health: [facility] [place], DME: [facility], [facility] and Patient/Family has no preference for HH/DME agencies  Comments (or additional information):FAMILY EDUCATION COMPLETED YESTERDAY WITH SON-PT TO GO TO SISTER'S HOME SHORT TIME  Patient/Family verbalized understanding of follow-up arrangements: Yes  Individual responsible for coordination of the follow-up plan: SELF  Confirmed correct DME delivered: Lucy Chris 01/06/2013    Lucy Chris

## 2013-01-17 ENCOUNTER — Telehealth: Payer: Self-pay | Admitting: Physical Medicine & Rehabilitation

## 2013-01-17 NOTE — Telephone Encounter (Signed)
PT called and stated he is out of his pain meds... Pt does not have a follow up appointment at [facility]... He was discharged December 22 2012 from the hospital... Dan PA prescribed 07.10.2014 his hydro morphond 20 mg #90;  Oxy condone 10 mg # 28;  Pt contact number  [phone]

## 2013-01-17 NOTE — Telephone Encounter (Signed)
Webb Silversmith the home health nurse called to report Mr.Winward needs a refill on his pain meds. He is becoming a little jittery. Please call.

## 2013-01-17 NOTE — Telephone Encounter (Signed)
Tried to contact patient but the [phone] was not a number for this patient.

## 2013-05-19 ENCOUNTER — Other Ambulatory Visit: Payer: Self-pay | Admitting: Orthopedic Surgery

## 2013-05-19 NOTE — Progress Notes (Signed)
Preoperative surgical orders have been place into the Epic hospital system for Kent Ryan on 05/19/2013, 10:37 PM  by Patrica Duel for surgery on 06/01/13.  Preop Knee orders including IV Tylenol and IV Decadron as long as there are no contraindications to the above medications. Avel Peace, PA-C

## 2013-05-24 ENCOUNTER — Encounter (HOSPITAL_COMMUNITY)
Admission: RE | Admit: 2013-05-24 | Discharge: 2013-05-24 | Disposition: A | Discharge status: Home / Self Care | Payer: BC Managed Care – PPO | Source: Ambulatory Visit | Attending: Orthopedic Surgery | Admitting: Orthopedic Surgery

## 2013-05-24 ENCOUNTER — Encounter (HOSPITAL_COMMUNITY): Payer: Self-pay | Admitting: Pharmacy Technician

## 2013-05-24 ENCOUNTER — Encounter (HOSPITAL_COMMUNITY): Payer: Self-pay

## 2013-05-24 DIAGNOSIS — Z01812 Encounter for preprocedural laboratory examination: Secondary | ICD-10-CM | POA: Insufficient documentation

## 2013-05-24 DIAGNOSIS — Z01818 Encounter for other preprocedural examination: Secondary | ICD-10-CM | POA: Insufficient documentation

## 2013-05-24 HISTORY — DX: Pure hypercholesterolemia, unspecified: E78.00

## 2013-05-24 LAB — BASIC METABOLIC PANEL
BUN: 13 mg/dL (ref 6–23)
CO2: 26 mEq/L (ref 19–32)
Calcium: 11 mg/dL — ABNORMAL HIGH (ref 8.4–10.5)
Chloride: 104 mEq/L (ref 96–112)
Creatinine, Ser: 1.04 mg/dL (ref 0.50–1.35)
GFR calc Af Amer: 86 mL/min — ABNORMAL LOW (ref >90)
GFR calc non Af Amer: 74 mL/min — ABNORMAL LOW (ref >90)
Sodium: 139 mEq/L (ref 135–145)

## 2013-05-24 LAB — CBC
MCHC: 33.1 g/dL (ref 30.0–36.0)
MCV: 81.4 fL (ref 78.0–100.0)
Platelets: 207 10*3/uL (ref 150–400)
RBC: 5.87 MIL/uL — ABNORMAL HIGH (ref 4.22–5.81)
RDW: 14.6 % (ref 11.5–15.5)
WBC: 5 10*3/uL (ref 4.0–10.5)

## 2013-05-24 NOTE — Patient Instructions (Signed)
[name]  05/24/2013   Your procedure is scheduled on: 06/01/13  Report to [facility] at 10:30 AM.  Call this number if you have problems the morning of surgery 336-: [phone]   Remember:    Do not eat food After Midnight, clear liquids from midnight until 7:00 am on 06/01/13 then nothing.      Take these medicines the morning of surgery with A SIP OF WATER: lipitor, valium if needed, flonase, oxycodone if needed, opcon-a eye drops    Do not wear jewelry, make-up or nail polish.  Do not wear lotions, powders, or perfumes. You may wear deodorant.  Do not shave 48 hours prior to surgery. Men may shave face and neck.  Do not bring valuables to the hospital.  Contacts, dentures or bridgework may not be worn into surgery.   Patients discharged the day of surgery will not be allowed to drive home.  Name and phone number of your driver: Maralyn Sago [phone]   Please read over the following fact sheets that you were given: incentive spirometry fact sheet  Birdie Sons, RN  pre op nurse call if needed [phone]    FAILURE TO FOLLOW THESE INSTRUCTIONS MAY RESULT IN CANCELLATION OF YOUR SURGERY   Patient Signature: ___________________________________________

## 2013-05-24 NOTE — Progress Notes (Signed)
Chest x-ray 12/13/12 on EPIC, EKG 2014 on chart

## 2013-05-31 NOTE — H&P (Signed)
  CC- Kent Ryan is a 62 y.o. male who presents with bilateral knee stiffness.  HPI- . Knee Pain: Patient presents with stiffness involving   bilateral knees. Onset of the symptoms was several months ago. Inciting event: bilateral total knee arthroplasty. Current symptoms include stiffness. He had bilateral total knee arthroplasty in 6/14 and had done well from a pain standpoint but has very limited flexion despite adequate physical therapy. He presents now for bilateral knee closed manipulation.  Past Medical History  Diagnosis Date  . Hypertension   . Sleep apnea     cpap - does not know settings   . Arthritis   . Hypercholesteremia     Past Surgical History  Procedure Laterality Date  . Back surgery  1980s    L4-5   . Torn cartilage- bilateral knees surgery     . Total knee arthroplasty Bilateral 12/13/2012    Procedure: TOTAL KNEE BILATERAL;  Surgeon: Loanne Drilling, MD;  Location: [facility];  Service: Orthopedics;  Laterality: Bilateral;  Right Knee first    Prior to Admission medications   Medication Sig Start Date End Date Taking? Authorizing Provider  atorvastatin (LIPITOR) 40 MG tablet Take 40 mg by mouth daily with breakfast. 01/06/13  Yes Daniel J Angiulli, PA-C  diazepam (VALIUM) 5 MG tablet Take 5 mg by mouth 2 (two) times daily as needed for muscle spasms.   Yes Historical Provider, MD  fluticasone (FLONASE) 50 MCG/ACT nasal spray Place 2 sprays into the nose daily as needed for rhinitis. 01/06/13  Yes Daniel J Angiulli, PA-C  latanoprost (XALATAN) 0.005 % ophthalmic solution Place 1 drop into both eyes at bedtime. 01/06/13  Yes Daniel J Angiulli, PA-C  lisinopril-hydrochlorothiazide (PRINZIDE,ZESTORETIC) 20-12.5 MG per tablet Take 1 tablet by mouth every morning. 01/06/13  Yes Daniel J Angiulli, PA-C  Naphazoline-Pheniramine (OPCON-A) 0.027-0.315 % SOLN Place 1 drop into both eyes 2 (two) times daily as needed (dry and itchy eyes).   Yes Historical Provider, MD  Oxycodone  HCl 10 MG TABS Take 10 mg by mouth daily as needed (pain).   Yes Historical Provider, MD   KNEE EXAM Right knee ROM 0-100; Left knee ROM 0-90. No effusion or tenderness in either knee  Physical Examination: General appearance - alert, well appearing, and in no distress Mental status - alert, oriented to person, place, and time Chest - clear to auscultation, no wheezes, rales or rhonchi, symmetric air entry Heart - normal rate, regular rhythm, normal S1, S2, no murmurs, rubs, clicks or gallops Abdomen - soft, nontender, nondistended, no masses or organomegaly Neurological - alert, oriented, normal speech, no focal findings or movement disorder noted   Asessment/Plan-Bilateral knee arthrofibrosis- - Plan bilateralt knee closed manipulation. Procedure risks and potential comps discussed with patient who elects to proceed. Goals are decreased pain and increased function with a high likelihood of achieving both

## 2013-06-01 ENCOUNTER — Ambulatory Visit (HOSPITAL_COMMUNITY)
Admission: RE | Admit: 2013-06-01 | Discharge: 2013-06-01 | Disposition: A | Discharge status: Home / Self Care | Payer: BC Managed Care – PPO | Source: Ambulatory Visit | Attending: Orthopedic Surgery | Admitting: Orthopedic Surgery

## 2013-06-01 ENCOUNTER — Encounter (HOSPITAL_COMMUNITY): Payer: BC Managed Care – PPO | Admitting: Certified Registered"

## 2013-06-01 ENCOUNTER — Ambulatory Visit (HOSPITAL_COMMUNITY): Payer: BC Managed Care – PPO | Admitting: Certified Registered"

## 2013-06-01 ENCOUNTER — Encounter (HOSPITAL_COMMUNITY): Payer: Self-pay | Admitting: *Deleted

## 2013-06-01 ENCOUNTER — Encounter (HOSPITAL_COMMUNITY)
Admission: RE | Disposition: A | Discharge status: Home / Self Care | Payer: Self-pay | Source: Ambulatory Visit | Attending: Orthopedic Surgery

## 2013-06-01 DIAGNOSIS — Z96659 Presence of unspecified artificial knee joint: Secondary | ICD-10-CM

## 2013-06-01 DIAGNOSIS — Z79899 Other long term (current) drug therapy: Secondary | ICD-10-CM | POA: Insufficient documentation

## 2013-06-01 DIAGNOSIS — M24669 Ankylosis, unspecified knee: Secondary | ICD-10-CM | POA: Insufficient documentation

## 2013-06-01 DIAGNOSIS — G4730 Sleep apnea, unspecified: Secondary | ICD-10-CM | POA: Insufficient documentation

## 2013-06-01 DIAGNOSIS — M25669 Stiffness of unspecified knee, not elsewhere classified: Secondary | ICD-10-CM

## 2013-06-01 DIAGNOSIS — I10 Essential (primary) hypertension: Secondary | ICD-10-CM | POA: Insufficient documentation

## 2013-06-01 DIAGNOSIS — E7800 Pure hypercholesterolemia, unspecified: Secondary | ICD-10-CM | POA: Insufficient documentation

## 2013-06-01 HISTORY — PX: KNEE CLOSED REDUCTION: SHX995

## 2013-06-01 SURGERY — MANIPULATION, KNEE, CLOSED
Anesthesia: General | Site: Knee | Laterality: Bilateral

## 2013-06-01 MED ORDER — LIDOCAINE HCL (CARDIAC) 20 MG/ML IV SOLN
INTRAVENOUS | Status: AC
  Filled 2013-06-01: qty 5

## 2013-06-01 MED ORDER — HYDROMORPHONE HCL 2 MG PO TABS: 2.0000 mg | ORAL_TABLET | Freq: Four times a day (QID) | ORAL | Status: AC | PRN

## 2013-06-01 MED ORDER — LACTATED RINGERS IV SOLN: INTRAVENOUS | Status: DC

## 2013-06-01 MED ORDER — PROPOFOL 10 MG/ML IV BOLUS
INTRAVENOUS | Status: DC | PRN
  Administered 2013-06-01: 200 mg via INTRAVENOUS

## 2013-06-01 MED ORDER — FENTANYL CITRATE 0.05 MG/ML IJ SOLN
INTRAMUSCULAR | Status: AC
  Filled 2013-06-01: qty 2

## 2013-06-01 MED ORDER — SODIUM CHLORIDE 0.9 % IV SOLN: INTRAVENOUS | Status: DC

## 2013-06-01 MED ORDER — MIDAZOLAM HCL 2 MG/2ML IJ SOLN
INTRAMUSCULAR | Status: AC
  Filled 2013-06-01: qty 2

## 2013-06-01 MED ORDER — PROPOFOL 10 MG/ML IV BOLUS
INTRAVENOUS | Status: AC
  Filled 2013-06-01: qty 20

## 2013-06-01 MED ORDER — HYDROMORPHONE HCL 2 MG PO TABS
2.0000 mg | ORAL_TABLET | Freq: Four times a day (QID) | ORAL | Status: DC | PRN
  Administered 2013-06-01: 4 mg via ORAL
  Filled 2013-06-01: qty 2

## 2013-06-01 MED ORDER — FENTANYL CITRATE 0.05 MG/ML IJ SOLN: 25.0000 ug | INTRAMUSCULAR | Status: DC | PRN

## 2013-06-01 MED ORDER — ACETAMINOPHEN 10 MG/ML IV SOLN
1000.0000 mg | Freq: Once | INTRAVENOUS | Status: AC
  Administered 2013-06-01: 1000 mg via INTRAVENOUS
  Filled 2013-06-01: qty 100

## 2013-06-01 MED ORDER — FENTANYL CITRATE 0.05 MG/ML IJ SOLN
INTRAMUSCULAR | Status: DC | PRN
  Administered 2013-06-01: 100 ug via INTRAVENOUS

## 2013-06-01 MED ORDER — MIDAZOLAM HCL 5 MG/5ML IJ SOLN
INTRAMUSCULAR | Status: DC | PRN
  Administered 2013-06-01: 2 mg via INTRAVENOUS

## 2013-06-01 MED ORDER — LACTATED RINGERS IV SOLN
INTRAVENOUS | Status: DC
  Administered 2013-06-01: 1000 mL via INTRAVENOUS

## 2013-06-01 MED ORDER — DEXAMETHASONE SODIUM PHOSPHATE 10 MG/ML IJ SOLN: 10.0000 mg | Freq: Once | INTRAMUSCULAR | Status: DC

## 2013-06-01 SURGICAL SUPPLY — 13 items
BANDAGE ADHESIVE 1X3 (GAUZE/BANDAGES/DRESSINGS) IMPLANT
GLOVE BIO SURGEON STRL SZ7.5 (GLOVE) IMPLANT
GLOVE BIOGEL PI IND STRL 8 (GLOVE) IMPLANT
GLOVE BIOGEL PI INDICATOR 8 (GLOVE)
GOWN PREVENTION PLUS LG XLONG (DISPOSABLE) IMPLANT
GOWN STRL REIN XL XLG (GOWN DISPOSABLE) IMPLANT
MANIFOLD NEPTUNE II (INSTRUMENTS) IMPLANT
NDL SAFETY ECLIPSE 18X1.5 (NEEDLE) IMPLANT
NEEDLE HYPO 18GX1.5 SHARP (NEEDLE)
POSITIONER SURGICAL ARM (MISCELLANEOUS) IMPLANT
SPONGE GAUZE 4X4 12PLY (GAUZE/BANDAGES/DRESSINGS) IMPLANT
SYR CONTROL 10ML LL (SYRINGE) IMPLANT
TOWEL OR 17X26 10 PK STRL BLUE (TOWEL DISPOSABLE) IMPLANT

## 2013-06-01 NOTE — Anesthesia Postprocedure Evaluation (Signed)
  Anesthesia Post-op Note  Patient: Kent Ryan  Procedure(s) Performed: Procedure(s) (LRB): CLOSED MANIPULATION KNEE BILATERAL (Bilateral)  Patient Location: PACU  Anesthesia Type: General  Level of Consciousness: awake and alert   Airway and Oxygen Therapy: Patient Spontanous Breathing  Post-op Pain: mild  Post-op Assessment: Post-op Vital signs reviewed, Patient's Cardiovascular Status Stable, Respiratory Function Stable, Patent Airway and No signs of Nausea or vomiting  Last Vitals:  Filed Vitals:   06/01/13 1320  BP: 108/76  Pulse: 61  Temp:   Resp: 14    Post-op Vital Signs: stable   Complications: No apparent anesthesia complications

## 2013-06-01 NOTE — Interval H&P Note (Signed)
History and Physical Interval Note:  06/01/2013 12:15 PM  Kent Ryan  has presented today for surgery, with the diagnosis of bilateral knee arthrofibrosis  The various methods of treatment have been discussed with the patient and family. After consideration of risks, benefits and other options for treatment, the patient has consented to  Procedure(s): CLOSED MANIPULATION KNEE BILATERAL (Bilateral) as a surgical intervention .  The patient's history has been reviewed, patient examined, no change in status, stable for surgery.  I have reviewed the patient's chart and labs.  Questions were answered to the patient's satisfaction.     Loanne Drilling

## 2013-06-01 NOTE — Transfer of Care (Signed)
Immediate Anesthesia Transfer of Care Note  Patient: Kent Ryan  Procedure(s) Performed: Procedure(s): CLOSED MANIPULATION KNEE BILATERAL (Bilateral)  Patient Location: PACU  Anesthesia Type:General  Level of Consciousness: awake, alert  and oriented  Airway & Oxygen Therapy: Patient Spontanous Breathing and Patient connected to face mask oxygen  Post-op Assessment: Report given to PACU RN and Post -op Vital signs reviewed and stable  Post vital signs: Reviewed and stable  Complications: No apparent anesthesia complications

## 2013-06-01 NOTE — Anesthesia Preprocedure Evaluation (Addendum)
Anesthesia Evaluation  Patient identified by MRN, date of birth, ID band Patient awake    Reviewed: Allergy & Precautions, H&P , NPO status , Patient's Chart, lab work & pertinent test results  Airway Mallampati: III TM Distance: >3 FB Neck ROM: full    Dental  (+) Edentulous Upper and Dental Advisory Given Only has one upper front tooth:   Pulmonary sleep apnea and Continuous Positive Airway Pressure Ventilation , former smoker,  breath sounds clear to auscultation  Pulmonary exam normal       Cardiovascular hypertension, Pt. on medications Rhythm:regular Rate:Normal     Neuro/Psych negative neurological ROS  negative psych ROS   GI/Hepatic negative GI ROS, Neg liver ROS,   Endo/Other  negative endocrine ROS  Renal/GU negative Renal ROS  negative genitourinary   Musculoskeletal   Abdominal   Peds  Hematology negative hematology ROS (+)   Anesthesia Other Findings   Reproductive/Obstetrics negative OB ROS                          Anesthesia Physical Anesthesia Plan  ASA: III  Anesthesia Plan: General   Post-op Pain Management:    Induction: Intravenous  Airway Management Planned: LMA  Additional Equipment:   Intra-op Plan:   Post-operative Plan:   Informed Consent: I have reviewed the patients History and Physical, chart, labs and discussed the procedure including the risks, benefits and alternatives for the proposed anesthesia with the patient or authorized representative who has indicated his/her understanding and acceptance.   Dental Advisory Given  Plan Discussed with: CRNA and Surgeon  Anesthesia Plan Comments:         Anesthesia Quick Evaluation

## 2013-06-01 NOTE — Op Note (Signed)
  OPERATIVE REPORT   PREOPERATIVE DIAGNOSIS: Arthrofibrosis, Bilateral  knees.   POSTOPERATIVE DIAGNOSIS: Arthrofibrosis, Bilateral knees.   PROCEDURE:  Bilateral  knee closed manipulation.   SURGEON: Ollen Gross, MD   ASSISTANT: None.   ANESTHESIA: General.   COMPLICATIONS: None.   CONDITION: Stable to Recovery.   Pre-manipulation range of motion is left 0-100; right 0-90.  Post-manipulation range of  Motion is left 0-120; right 0-125  PROCEDURE IN DETAIL: After successful administration of general  anesthetic, exam under anesthesia was performed showing range of motion  Left 0-100 degrees. I then placed my chest against the proximal tibia,  flexing the knee with audible lysis of adhesions. I was easily able to  get the knee flexed to 120  degrees. I then put the knee back in extension and with some  patellar manipulation and gentle pressure got to full extension. I then addressed the right knee and exam showed range of motion 0-90 degrees. I then placed my chest against the proximal tibia,  flexing the knee with audible lysis of adhesions. I was easily able to  get the knee flexed to 125 degrees. I then put the knee back in extension and with some  patellar manipulation and gentle pressure got to full extension. The patient was subsequently awakened and transported to Recovery in  stable condition.

## 2013-06-02 ENCOUNTER — Encounter (HOSPITAL_COMMUNITY): Payer: Self-pay | Admitting: Orthopedic Surgery

## 2013-08-31 ENCOUNTER — Other Ambulatory Visit: Payer: Self-pay | Admitting: *Deleted

## 2013-08-31 ENCOUNTER — Other Ambulatory Visit (HOSPITAL_COMMUNITY): Payer: Self-pay | Admitting: Orthopedic Surgery

## 2013-08-31 ENCOUNTER — Ambulatory Visit (HOSPITAL_COMMUNITY)
Admission: RE | Admit: 2013-08-31 | Discharge: 2013-08-31 | Disposition: A | Discharge status: Home / Self Care | Payer: BC Managed Care – PPO | Source: Ambulatory Visit | Attending: Cardiovascular Disease | Admitting: Cardiovascular Disease

## 2013-08-31 DIAGNOSIS — R609 Edema, unspecified: Secondary | ICD-10-CM

## 2013-08-31 DIAGNOSIS — M79609 Pain in unspecified limb: Secondary | ICD-10-CM

## 2013-08-31 DIAGNOSIS — M79604 Pain in right leg: Secondary | ICD-10-CM

## 2013-08-31 DIAGNOSIS — M7989 Other specified soft tissue disorders: Secondary | ICD-10-CM | POA: Insufficient documentation

## 2013-08-31 NOTE — Progress Notes (Signed)
Right Lower Extremity Venous Duplex Completed. Negative for DVT and SVT. °Brianna L Mazza,RVT °

## 2014-11-20 IMAGING — CR DG CHEST 2V
2 series · 2 of 2 positions shown · non-contrast
Comparison: None

CLINICAL DATA: Hypertension, preoperative assessment for bilateral
knee surgery, former smoker

CHEST - 2 VIEW

[w chest pa]
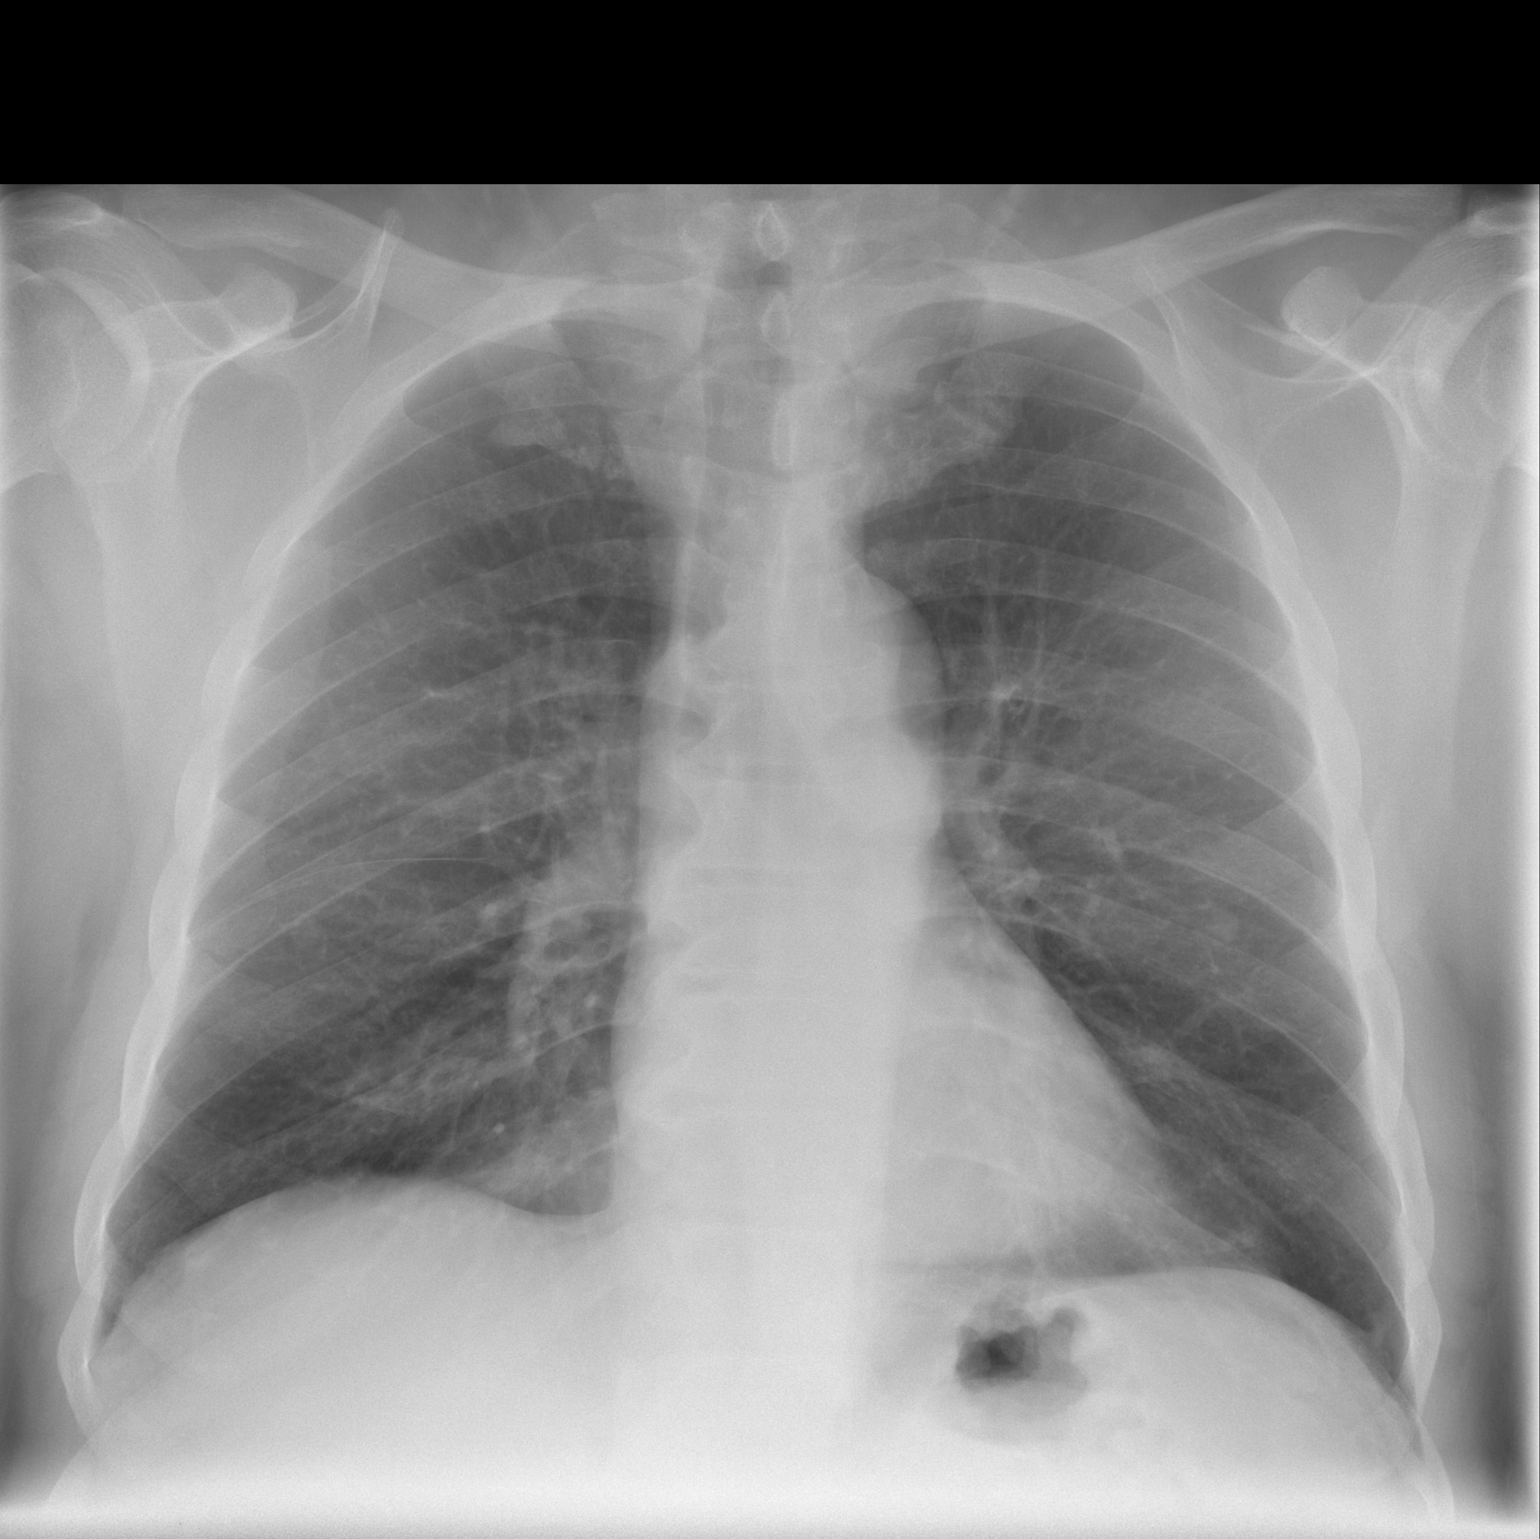

[w chest lat]
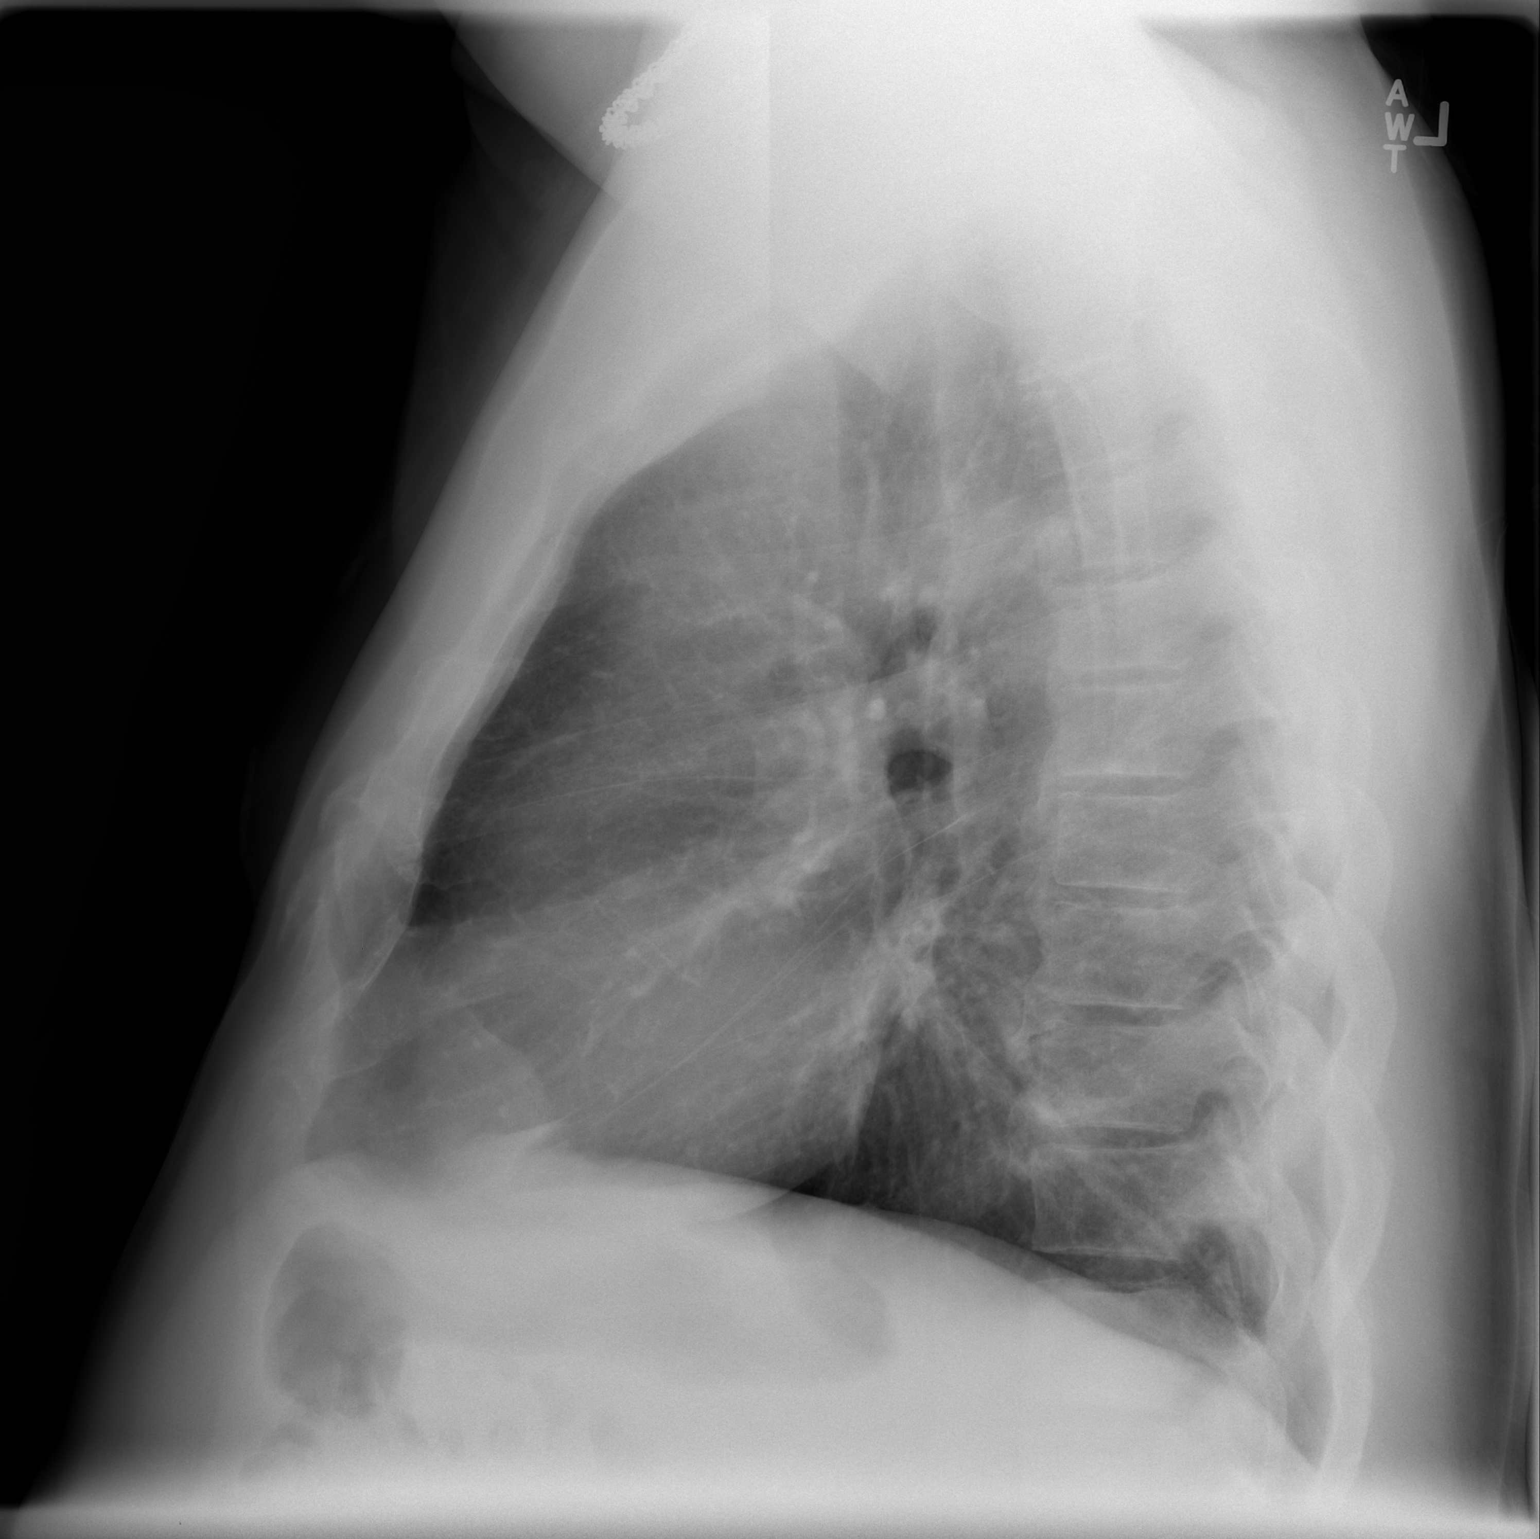

[2 of 2 positions shown; findings below may reference images not displayed]

FINDINGS: Normal heart size, mediastinal contours, and pulmonary vascularity.
Peribronchial thickening without infiltrate, pleural effusion or
pneumothorax.
Questionable nodular density versus nipple shadow left lung.
Scattered endplate spur formation thoracic spine.
IMPRESSION: Minimal bronchitic changes.
Question left nipple shadow versus nodule; repeat PA chest
radiograph with nipple markers recommended to exclude pulmonary
nodule.

## 2014-11-26 IMAGING — CR DG CHEST 1V
1 series · 1 of 1 positions shown · non-contrast
Comparison: 12/07/2012 radiographs.

CLINICAL DATA: Possible pulmonary nodule versus prominent nipple
shadow.

CHEST - 1 VIEW

[w chest pa]
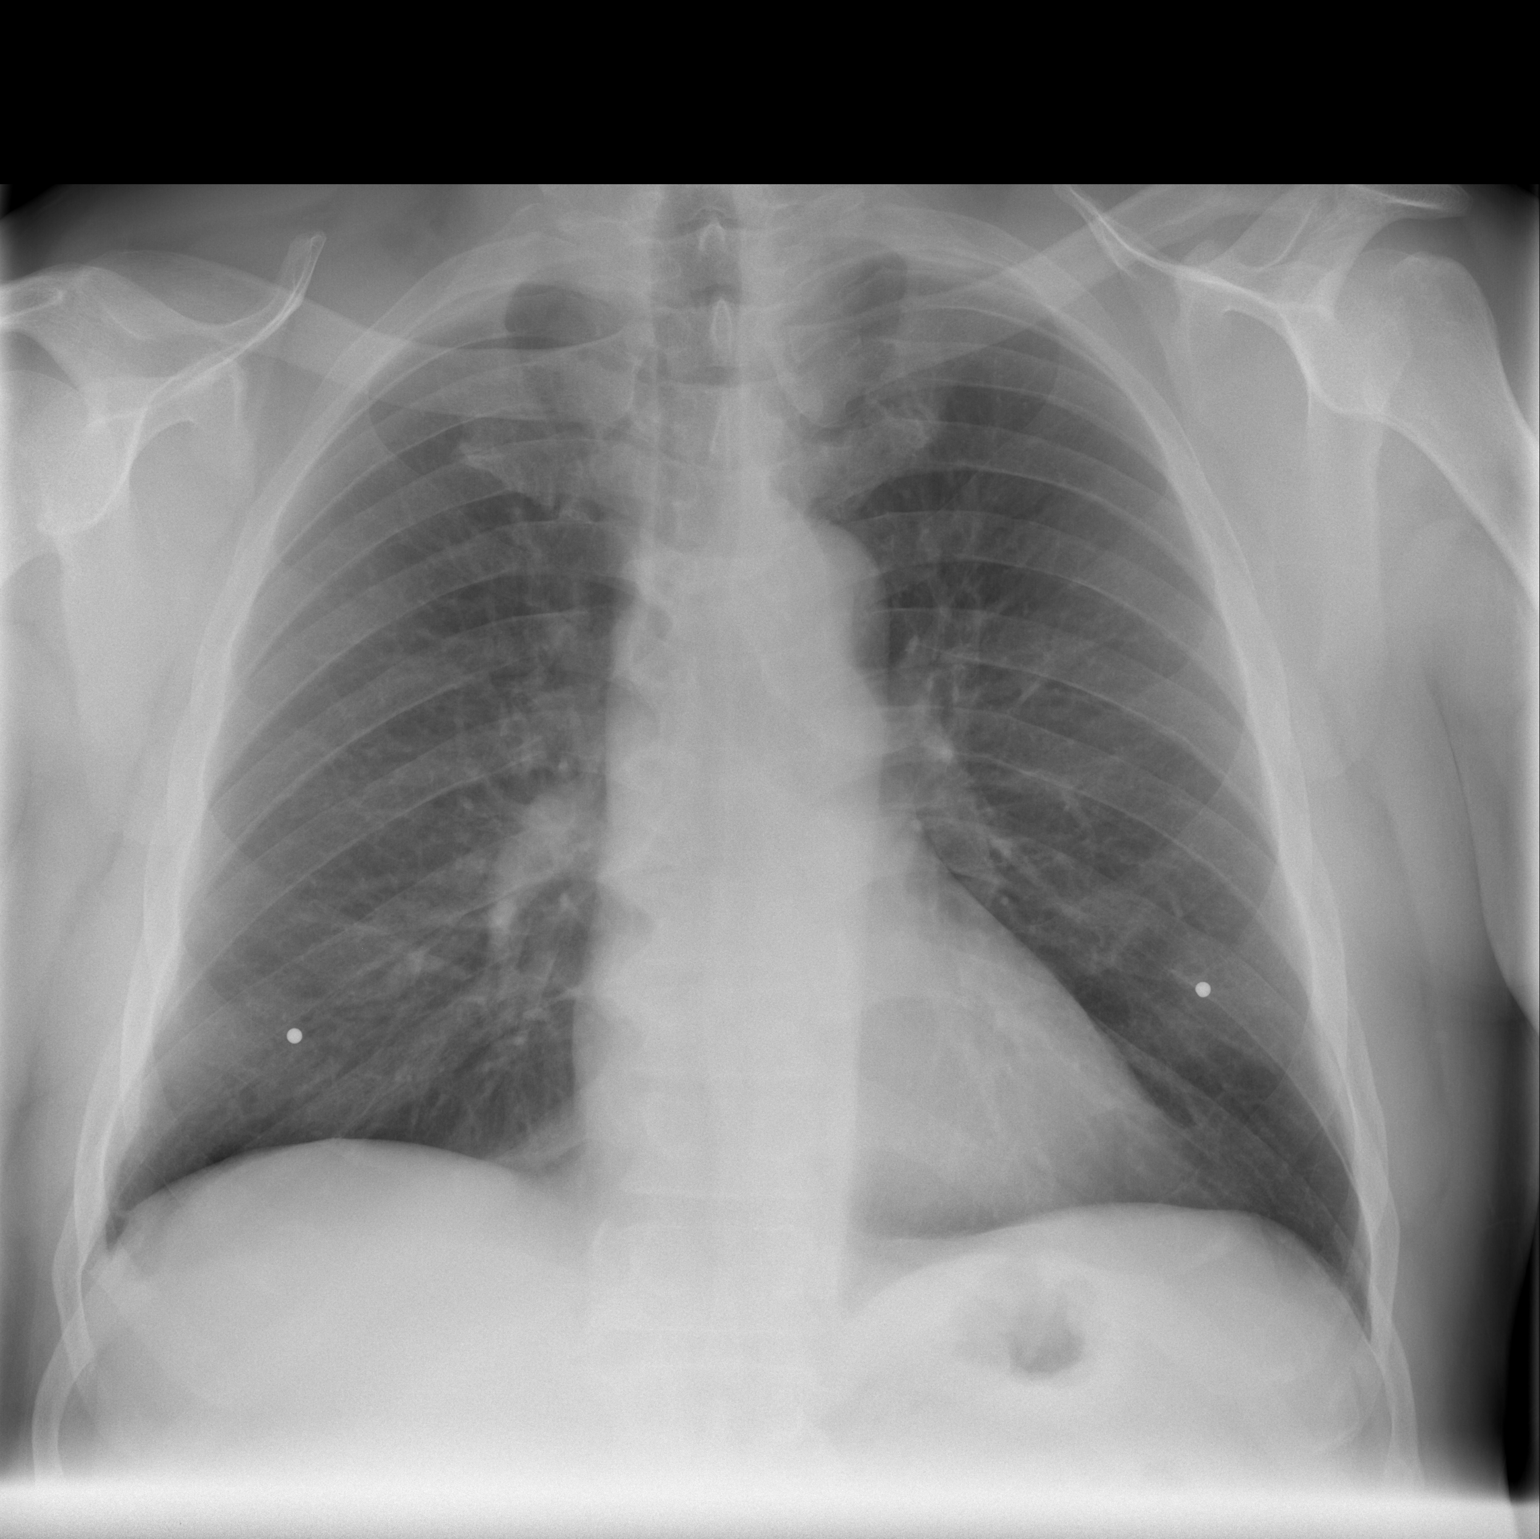

[1 of 1 positions shown; findings below may reference images not displayed]

FINDINGS: PA view with nipple markers demonstrates the previously
demonstrated nodular density on the left to correspond with the
left nipple shadow.  There is no evidence of pulmonary nodule.  The
lungs appear clear.  Heart size and mediastinal contours appear
normal.
IMPRESSION: Previously demonstrated nodular density appears to represent the
left nipple shadow.  No evidence of pulmonary nodule.

## 2014-11-29 IMAGING — CR DG ABDOMEN 1V
1 series · 1 of 1 positions shown · non-contrast
Comparison: None.

CLINICAL DATA: Abdominal pain and distention.  Nausea and vomiting.

ABDOMEN - 1 VIEW

[AP]
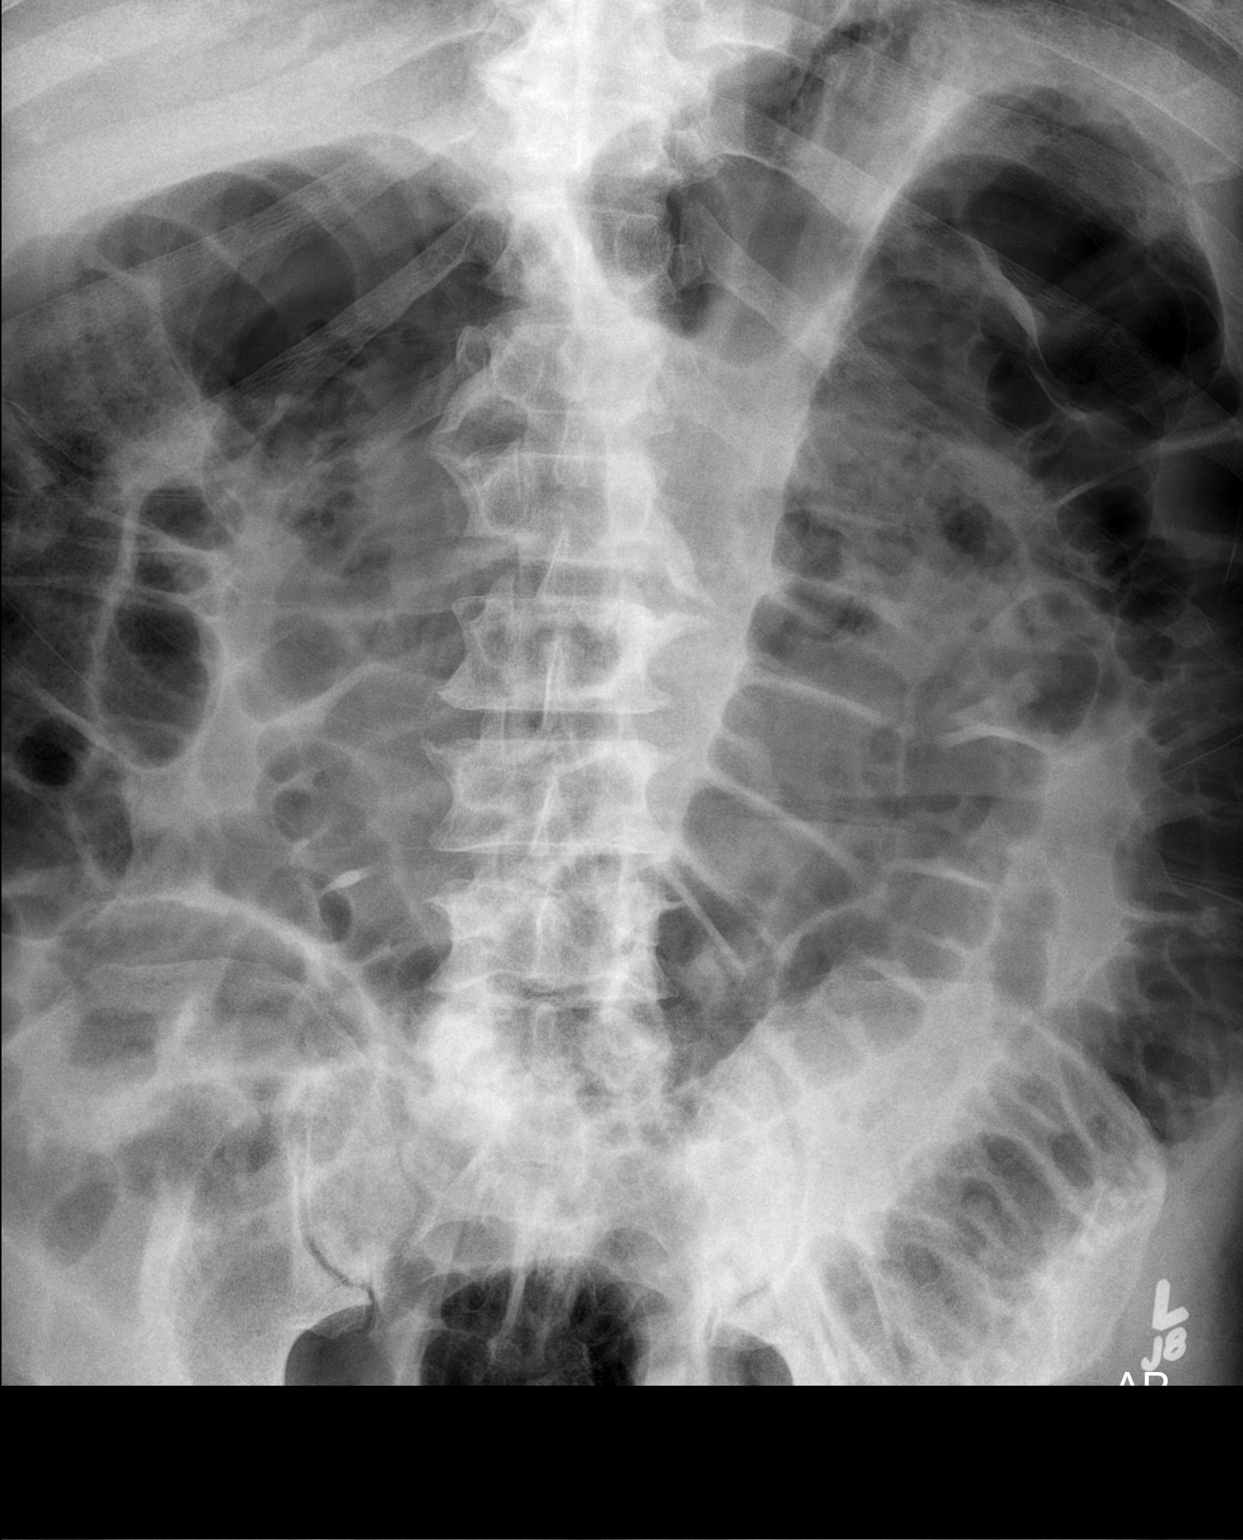

[1 of 1 positions shown; findings below may reference images not displayed]

FINDINGS: There is extensive air throughout the slightly distended
colon.  There is no small bowel dilatation but there is air in the
small bowel and stomach.

No acute osseous abnormality.
IMPRESSION: Ileus.

## 2014-12-04 IMAGING — CR DG ABDOMEN 1V
1 series · 2 of 2 positions shown · non-contrast
Comparison: 12/20/2012

CLINICAL DATA: Ileus with nausea and vomiting.

ABDOMEN - 1 VIEW

[Series 1: ap (kub) · U · 2 of 2 slices shown]
[im 1/2]
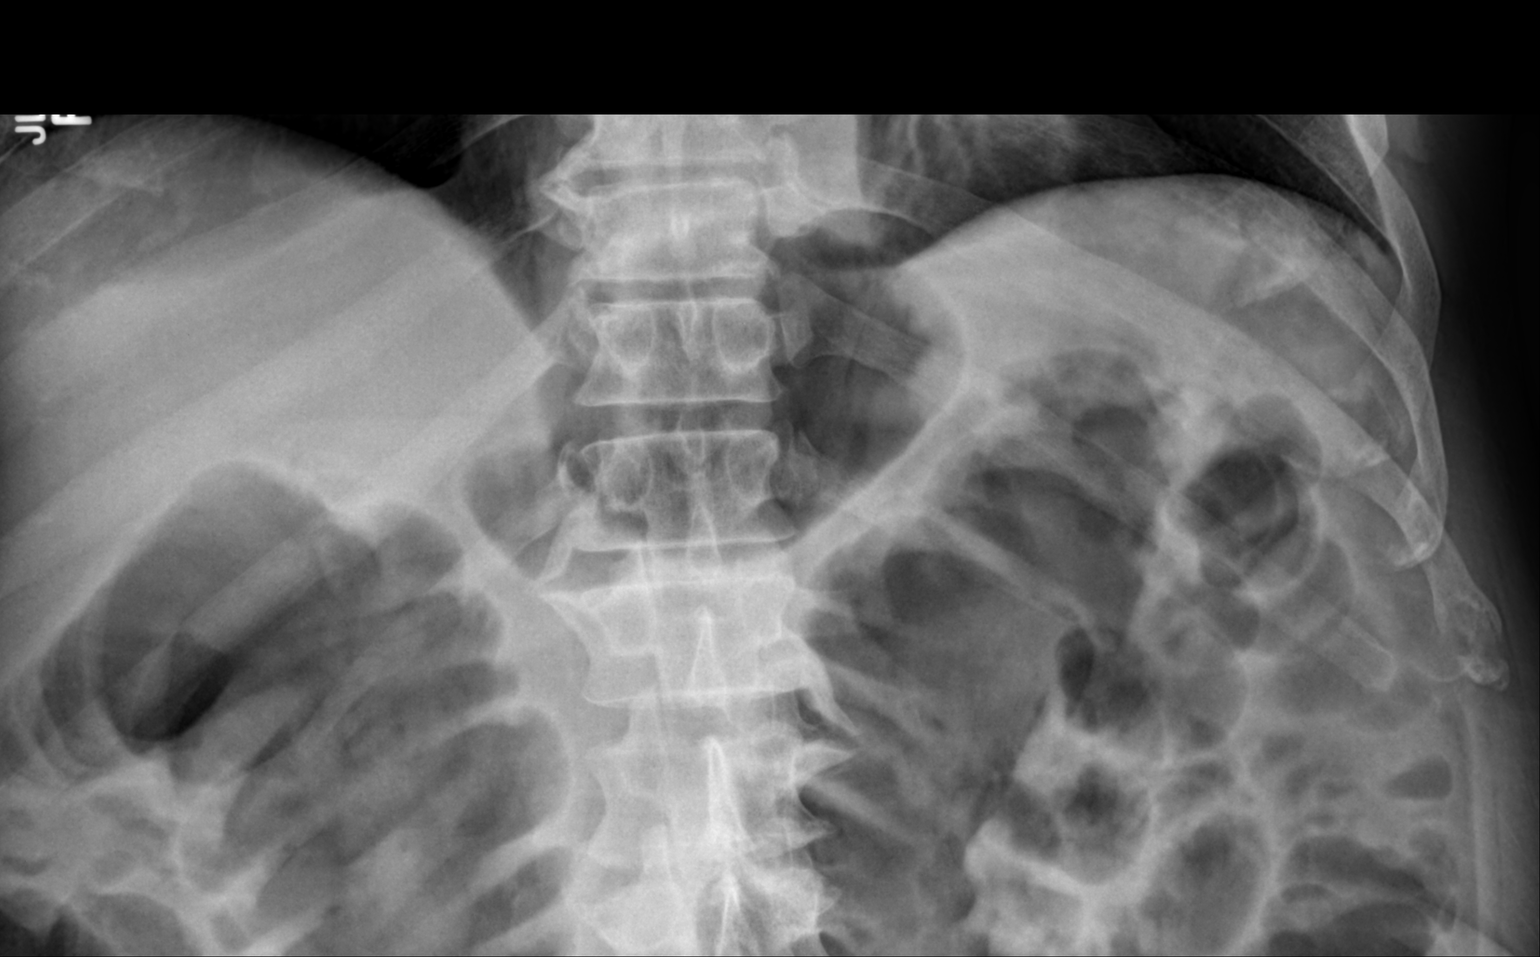
[im 2/2]
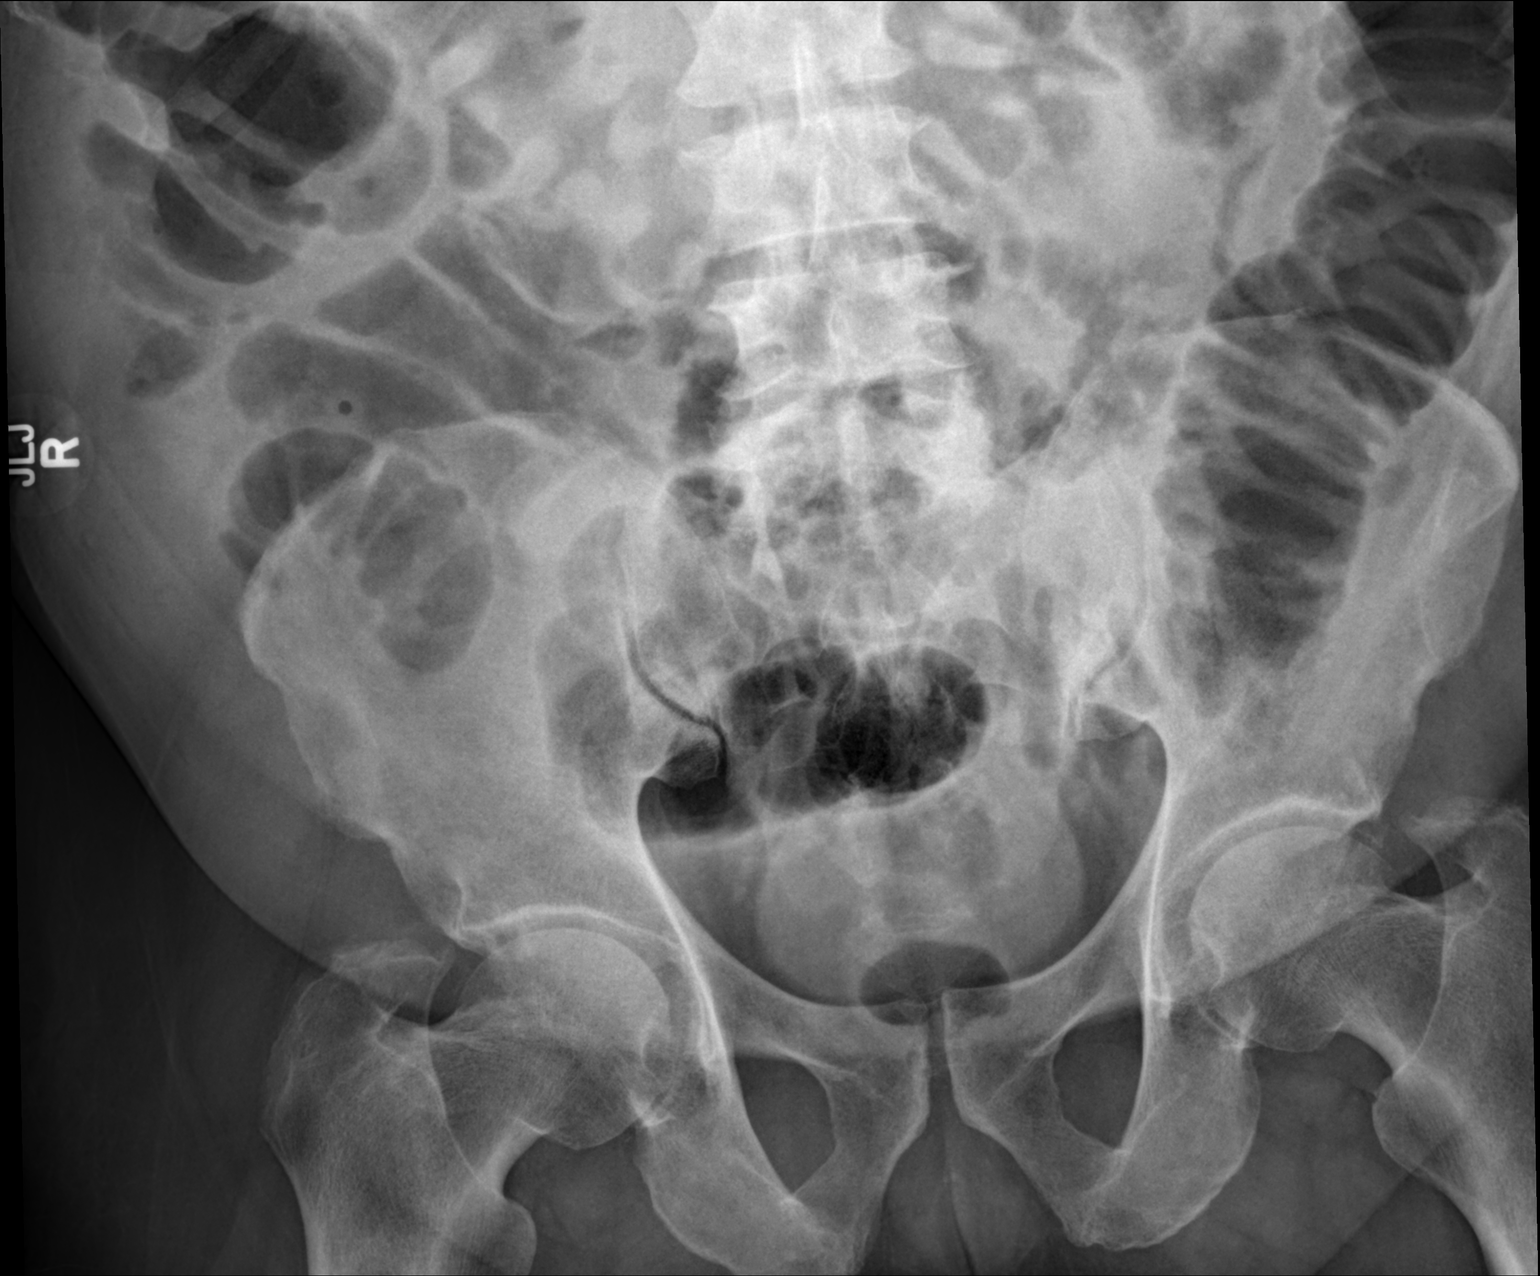

[2 of 2 positions shown; findings below may reference images not displayed]

FINDINGS: Gaseous dilatation of predominately the colon is
relatively stable and may be minimally improved.  There is no
evidence of small bowel obstruction.  No abnormal calcifications
are identified.
IMPRESSION: Stable to minimally improved colonic ileus.
